# Patient Record
Sex: Male | Born: 1955 | Race: Black or African American | Hispanic: No | Marital: Married | Smoking: Former smoker
Health system: Southern US, Community
[De-identification: ages and names within clinical notes are randomized; demographics above are authoritative.]

## PROBLEM LIST (undated history)

## (undated) DIAGNOSIS — M25511 Pain in right shoulder: Principal | ICD-10-CM

## (undated) DIAGNOSIS — M67912 Unspecified disorder of synovium and tendon, left shoulder: Secondary | ICD-10-CM

## (undated) DIAGNOSIS — M25569 Pain in unspecified knee: Secondary | ICD-10-CM

## (undated) DIAGNOSIS — M67814 Other specified disorders of tendon, left shoulder: Principal | ICD-10-CM

## (undated) DIAGNOSIS — Z79899 Other long term (current) drug therapy: Secondary | ICD-10-CM

## (undated) DIAGNOSIS — M25552 Pain in left hip: Secondary | ICD-10-CM

## (undated) DIAGNOSIS — S83242A Other tear of medial meniscus, current injury, left knee, initial encounter: Secondary | ICD-10-CM

## (undated) DIAGNOSIS — S59919A Unspecified injury of unspecified forearm, initial encounter: Secondary | ICD-10-CM

## (undated) DIAGNOSIS — T4145XA Adverse effect of unspecified anesthetic, initial encounter: Secondary | ICD-10-CM

## (undated) DIAGNOSIS — G4700 Insomnia, unspecified: Secondary | ICD-10-CM

## (undated) DIAGNOSIS — S065X9A Traumatic subdural hemorrhage with loss of consciousness of unspecified duration, initial encounter: Secondary | ICD-10-CM

## (undated) DIAGNOSIS — Z5189 Encounter for other specified aftercare: Secondary | ICD-10-CM

## (undated) DIAGNOSIS — T7840XA Allergy, unspecified, initial encounter: Secondary | ICD-10-CM

## (undated) DIAGNOSIS — K219 Gastro-esophageal reflux disease without esophagitis: Secondary | ICD-10-CM

## (undated) DIAGNOSIS — G4730 Sleep apnea, unspecified: Secondary | ICD-10-CM

## (undated) DIAGNOSIS — F4310 Post-traumatic stress disorder, unspecified: Secondary | ICD-10-CM

## (undated) DIAGNOSIS — N529 Male erectile dysfunction, unspecified: Secondary | ICD-10-CM

## (undated) DIAGNOSIS — F32A Depression, unspecified: Secondary | ICD-10-CM

## (undated) DIAGNOSIS — H269 Unspecified cataract: Secondary | ICD-10-CM

## (undated) DIAGNOSIS — M545 Low back pain, unspecified: Secondary | ICD-10-CM

## (undated) DIAGNOSIS — K5792 Diverticulitis of intestine, part unspecified, without perforation or abscess without bleeding: Secondary | ICD-10-CM

## (undated) DIAGNOSIS — G709 Myoneural disorder, unspecified: Secondary | ICD-10-CM

## (undated) DIAGNOSIS — T8859XA Other complications of anesthesia, initial encounter: Secondary | ICD-10-CM

## (undated) DIAGNOSIS — I10 Essential (primary) hypertension: Secondary | ICD-10-CM

## (undated) DIAGNOSIS — Q741 Congenital malformation of knee: Secondary | ICD-10-CM

## (undated) DIAGNOSIS — M1990 Unspecified osteoarthritis, unspecified site: Secondary | ICD-10-CM

## (undated) DIAGNOSIS — F329 Major depressive disorder, single episode, unspecified: Secondary | ICD-10-CM

## (undated) DIAGNOSIS — R51 Headache: Secondary | ICD-10-CM

## (undated) DIAGNOSIS — Z8489 Family history of other specified conditions: Secondary | ICD-10-CM

## (undated) HISTORY — DX: Male erectile dysfunction, unspecified: N52.9

## (undated) HISTORY — DX: Major depressive disorder, single episode, unspecified: F32.9

## (undated) HISTORY — DX: Traumatic subdural hemorrhage with loss of consciousness of unspecified duration, initial encounter: S06.5X9A

## (undated) HISTORY — DX: Gastro-esophageal reflux disease without esophagitis: K21.9

## (undated) HISTORY — PX: OTHER SURGICAL HISTORY: SHX169

## (undated) HISTORY — DX: Myoneural disorder, unspecified: G70.9

## (undated) HISTORY — DX: Depression, unspecified: F32.A

## (undated) HISTORY — PX: EYE SURGERY: SHX253

## (undated) HISTORY — PX: UPPER GASTROINTESTINAL ENDOSCOPY: SHX188

## (undated) HISTORY — DX: Encounter for other specified aftercare: Z51.89

## (undated) HISTORY — DX: Low back pain: M54.5

## (undated) HISTORY — DX: Insomnia, unspecified: G47.00

## (undated) HISTORY — DX: Headache: R51

## (undated) HISTORY — DX: Allergy, unspecified, initial encounter: T78.40XA

## (undated) HISTORY — DX: Post-traumatic stress disorder, unspecified: F43.10

## (undated) HISTORY — DX: Unspecified osteoarthritis, unspecified site: M19.90

## (undated) HISTORY — PX: VASECTOMY: SHX75

## (undated) HISTORY — DX: Unspecified cataract: H26.9

## (undated) HISTORY — DX: Low back pain, unspecified: M54.50

## (undated) HISTORY — PX: ULNAR NERVE TRANSPOSITION: SHX2595

## (undated) HISTORY — DX: Essential (primary) hypertension: I10

---

## 1977-11-24 ENCOUNTER — Encounter: Payer: Self-pay | Admitting: Family Medicine

## 1983-07-29 HISTORY — PX: CATARACT EXTRACTION: SUR2

## 1995-07-29 DIAGNOSIS — S065XAA Traumatic subdural hemorrhage with loss of consciousness status unknown, initial encounter: Secondary | ICD-10-CM

## 1995-07-29 DIAGNOSIS — S065X9A Traumatic subdural hemorrhage with loss of consciousness of unspecified duration, initial encounter: Secondary | ICD-10-CM

## 1995-07-29 HISTORY — DX: Traumatic subdural hemorrhage with loss of consciousness status unknown, initial encounter: S06.5XAA

## 1995-07-29 HISTORY — DX: Traumatic subdural hemorrhage with loss of consciousness of unspecified duration, initial encounter: S06.5X9A

## 1995-07-29 HISTORY — PX: BURR HOLE FOR SUBDURAL HEMATOMA: SHX1275

## 1998-07-28 HISTORY — PX: LUMBAR LAMINECTOMY: SHX95

## 2003-07-29 HISTORY — PX: KNEE ARTHROSCOPY: SHX127

## 2007-07-01 ENCOUNTER — Ambulatory Visit: Payer: Self-pay | Admitting: Family Medicine

## 2007-07-01 DIAGNOSIS — G4700 Insomnia, unspecified: Secondary | ICD-10-CM | POA: Insufficient documentation

## 2007-07-01 DIAGNOSIS — F329 Major depressive disorder, single episode, unspecified: Secondary | ICD-10-CM

## 2007-07-01 DIAGNOSIS — R519 Headache, unspecified: Secondary | ICD-10-CM | POA: Insufficient documentation

## 2007-07-01 DIAGNOSIS — J309 Allergic rhinitis, unspecified: Secondary | ICD-10-CM | POA: Insufficient documentation

## 2007-07-01 DIAGNOSIS — I10 Essential (primary) hypertension: Secondary | ICD-10-CM | POA: Insufficient documentation

## 2007-07-01 DIAGNOSIS — R51 Headache: Secondary | ICD-10-CM

## 2007-07-01 DIAGNOSIS — K219 Gastro-esophageal reflux disease without esophagitis: Secondary | ICD-10-CM

## 2007-07-01 DIAGNOSIS — F528 Other sexual dysfunction not due to a substance or known physiological condition: Secondary | ICD-10-CM | POA: Insufficient documentation

## 2007-07-08 LAB — CONVERTED CEMR LAB
ALT: 31 units/L (ref 0–53)
AST: 25 units/L (ref 0–37)
Albumin: 4.1 g/dL (ref 3.5–5.2)
Alkaline Phosphatase: 52 units/L (ref 39–117)
BUN: 13 mg/dL (ref 6–23)
Basophils Relative: 0.1 % (ref 0.0–1.0)
Bilirubin, Direct: 0.2 mg/dL (ref 0.0–0.3)
Chloride: 107 meq/L (ref 96–112)
Creatinine, Ser: 0.9 mg/dL (ref 0.4–1.5)
Direct LDL: 123.6 mg/dL
Eosinophils Absolute: 0.2 10*3/uL (ref 0.0–0.6)
Eosinophils Relative: 3.3 % (ref 0.0–5.0)
GFR calc Af Amer: 114 mL/min
GFR calc non Af Amer: 94 mL/min
HDL: 44.8 mg/dL (ref >39.0)
Hemoglobin: 13.8 g/dL (ref 13.0–17.0)
Neutrophils Relative %: 42 % — ABNORMAL LOW (ref 43.0–77.0)
PSA: 0.37 ng/mL (ref 0.10–4.00)
Platelets: 198 10*3/uL (ref 150–400)
Potassium: 4.8 meq/L (ref 3.5–5.1)
RDW: 13.6 % (ref 11.5–14.6)
Sodium: 142 meq/L (ref 135–145)
Total CHOL/HDL Ratio: 4.5
Total Protein: 7 g/dL (ref 6.0–8.3)
Triglycerides: 96 mg/dL (ref 0–149)

## 2007-10-19 ENCOUNTER — Ambulatory Visit: Payer: Self-pay | Admitting: Family Medicine

## 2007-11-03 ENCOUNTER — Encounter: Payer: Self-pay | Admitting: Family Medicine

## 2007-11-04 ENCOUNTER — Ambulatory Visit: Payer: Self-pay | Admitting: Gastroenterology

## 2007-11-18 ENCOUNTER — Ambulatory Visit: Payer: Self-pay | Admitting: Gastroenterology

## 2007-11-18 ENCOUNTER — Encounter: Payer: Self-pay | Admitting: Family Medicine

## 2007-11-24 ENCOUNTER — Encounter: Payer: Self-pay | Admitting: Family Medicine

## 2007-11-30 ENCOUNTER — Ambulatory Visit: Payer: Self-pay | Admitting: Family Medicine

## 2007-12-03 ENCOUNTER — Telehealth: Payer: Self-pay | Admitting: Family Medicine

## 2007-12-06 LAB — CONVERTED CEMR LAB
HCV Ab: NEGATIVE
HIV-2 Ab: NEGATIVE
Hep B C IgM: NEGATIVE
Hep B Core Total Ab: NEGATIVE
Hep B E Ag: NEGATIVE

## 2008-01-14 ENCOUNTER — Telehealth: Payer: Self-pay | Admitting: Family Medicine

## 2008-03-01 ENCOUNTER — Telehealth: Payer: Self-pay | Admitting: Family Medicine

## 2008-03-03 ENCOUNTER — Ambulatory Visit: Payer: Self-pay | Admitting: Family Medicine

## 2008-03-03 DIAGNOSIS — N4400 Torsion of testis, unspecified: Secondary | ICD-10-CM | POA: Insufficient documentation

## 2008-03-20 ENCOUNTER — Encounter: Payer: Self-pay | Admitting: Family Medicine

## 2008-04-20 ENCOUNTER — Encounter: Payer: Self-pay | Admitting: Family Medicine

## 2008-06-19 ENCOUNTER — Observation Stay (HOSPITAL_COMMUNITY): Admission: EM | Admit: 2008-06-19 | Discharge: 2008-06-20 | Payer: Self-pay | Admitting: Emergency Medicine

## 2008-06-19 ENCOUNTER — Ambulatory Visit: Payer: Self-pay | Admitting: Internal Medicine

## 2008-06-28 ENCOUNTER — Ambulatory Visit: Payer: Self-pay | Admitting: Family Medicine

## 2008-06-28 DIAGNOSIS — K625 Hemorrhage of anus and rectum: Secondary | ICD-10-CM | POA: Insufficient documentation

## 2008-07-25 ENCOUNTER — Encounter: Payer: Self-pay | Admitting: Gastroenterology

## 2008-08-08 ENCOUNTER — Ambulatory Visit: Payer: Self-pay | Admitting: Gastroenterology

## 2008-08-08 ENCOUNTER — Encounter: Payer: Self-pay | Admitting: Family Medicine

## 2008-08-17 ENCOUNTER — Telehealth: Payer: Self-pay | Admitting: Family Medicine

## 2008-08-18 ENCOUNTER — Ambulatory Visit: Payer: Self-pay | Admitting: Gastroenterology

## 2008-08-18 HISTORY — PX: ESOPHAGOGASTRODUODENOSCOPY: SHX1529

## 2008-09-20 ENCOUNTER — Ambulatory Visit: Payer: Self-pay | Admitting: Family Medicine

## 2008-09-20 DIAGNOSIS — M25569 Pain in unspecified knee: Secondary | ICD-10-CM

## 2008-09-20 DIAGNOSIS — M545 Low back pain: Secondary | ICD-10-CM

## 2008-09-20 DIAGNOSIS — M1990 Unspecified osteoarthritis, unspecified site: Secondary | ICD-10-CM | POA: Insufficient documentation

## 2008-09-27 ENCOUNTER — Encounter: Payer: Self-pay | Admitting: Family Medicine

## 2008-10-31 ENCOUNTER — Telehealth: Payer: Self-pay | Admitting: Family Medicine

## 2008-11-06 ENCOUNTER — Encounter: Payer: Self-pay | Admitting: Family Medicine

## 2008-11-21 ENCOUNTER — Encounter: Payer: Self-pay | Admitting: Family Medicine

## 2008-11-22 ENCOUNTER — Ambulatory Visit: Payer: Self-pay | Admitting: Family Medicine

## 2008-11-22 DIAGNOSIS — M79609 Pain in unspecified limb: Secondary | ICD-10-CM

## 2008-12-07 ENCOUNTER — Ambulatory Visit: Payer: Self-pay | Admitting: Family Medicine

## 2008-12-07 ENCOUNTER — Telehealth: Payer: Self-pay | Admitting: Family Medicine

## 2008-12-07 DIAGNOSIS — M5380 Other specified dorsopathies, site unspecified: Secondary | ICD-10-CM | POA: Insufficient documentation

## 2008-12-07 DIAGNOSIS — T1490XA Injury, unspecified, initial encounter: Secondary | ICD-10-CM

## 2008-12-26 ENCOUNTER — Encounter (INDEPENDENT_AMBULATORY_CARE_PROVIDER_SITE_OTHER): Payer: Self-pay | Admitting: *Deleted

## 2008-12-26 ENCOUNTER — Encounter: Admission: RE | Admit: 2008-12-26 | Discharge: 2008-12-26 | Payer: Self-pay | Admitting: Allergy and Immunology

## 2009-04-30 ENCOUNTER — Telehealth: Payer: Self-pay | Admitting: Family Medicine

## 2009-05-18 ENCOUNTER — Ambulatory Visit: Payer: Self-pay | Admitting: Family Medicine

## 2009-05-18 DIAGNOSIS — M542 Cervicalgia: Secondary | ICD-10-CM

## 2009-06-18 ENCOUNTER — Ambulatory Visit: Payer: Self-pay | Admitting: Family Medicine

## 2009-08-06 ENCOUNTER — Encounter: Payer: Self-pay | Admitting: Family Medicine

## 2009-08-17 ENCOUNTER — Ambulatory Visit: Payer: Self-pay | Admitting: Family Medicine

## 2009-08-17 DIAGNOSIS — M779 Enthesopathy, unspecified: Secondary | ICD-10-CM | POA: Insufficient documentation

## 2009-11-13 ENCOUNTER — Ambulatory Visit: Payer: Self-pay | Admitting: Family Medicine

## 2009-11-13 DIAGNOSIS — R079 Chest pain, unspecified: Secondary | ICD-10-CM

## 2009-11-14 ENCOUNTER — Encounter: Payer: Self-pay | Admitting: Family Medicine

## 2009-11-15 ENCOUNTER — Telehealth: Payer: Self-pay | Admitting: Family Medicine

## 2009-11-21 ENCOUNTER — Telehealth (INDEPENDENT_AMBULATORY_CARE_PROVIDER_SITE_OTHER): Payer: Self-pay | Admitting: *Deleted

## 2009-11-22 ENCOUNTER — Encounter: Payer: Self-pay | Admitting: Internal Medicine

## 2009-11-22 ENCOUNTER — Ambulatory Visit: Payer: Self-pay

## 2009-11-22 ENCOUNTER — Encounter (HOSPITAL_COMMUNITY): Admission: RE | Admit: 2009-11-22 | Discharge: 2010-01-30 | Payer: Self-pay | Admitting: Family Medicine

## 2009-11-22 ENCOUNTER — Ambulatory Visit: Payer: Self-pay | Admitting: Cardiology

## 2009-12-17 ENCOUNTER — Telehealth: Payer: Self-pay | Admitting: Family Medicine

## 2009-12-18 ENCOUNTER — Telehealth: Payer: Self-pay | Admitting: Family Medicine

## 2010-01-01 ENCOUNTER — Encounter: Admission: RE | Admit: 2010-01-01 | Discharge: 2010-01-01 | Payer: Self-pay | Admitting: Orthopedic Surgery

## 2010-01-01 ENCOUNTER — Encounter: Payer: Self-pay | Admitting: Family Medicine

## 2010-01-10 ENCOUNTER — Encounter: Payer: Self-pay | Admitting: Family Medicine

## 2010-01-29 ENCOUNTER — Encounter: Payer: Self-pay | Admitting: Family Medicine

## 2010-02-06 ENCOUNTER — Encounter: Payer: Self-pay | Admitting: Family Medicine

## 2010-03-26 ENCOUNTER — Ambulatory Visit: Payer: Self-pay | Admitting: Family Medicine

## 2010-03-26 DIAGNOSIS — F4310 Post-traumatic stress disorder, unspecified: Secondary | ICD-10-CM

## 2010-04-29 ENCOUNTER — Encounter: Payer: Self-pay | Admitting: Family Medicine

## 2010-07-15 ENCOUNTER — Telehealth: Payer: Self-pay | Admitting: Family Medicine

## 2010-08-27 NOTE — Letter (Signed)
Summary: [facility]   [facility]   Imported By: Maryln Gottron 05/21/2010 14:04:42  _____________________________________________________________________  External Attachment:    Type:   Image     Comment:   External Document

## 2010-08-27 NOTE — Letter (Signed)
Summary: [facility]   Imported By: Maryln Gottron 03/29/2010 10:34:31  _____________________________________________________________________  External Attachment:    Type:   Image     Comment:   External Document

## 2010-08-27 NOTE — Op Note (Signed)
Summary: Right Knee Meniscal Repair/Dr. Marrianne Mood Dean  Right Knee Meniscal Repair/Dr. Burnard Bunting   Imported By: Maryln Gottron 03/29/2010 10:31:16  _____________________________________________________________________  External Attachment:    Type:   Image     Comment:   External Document

## 2010-08-27 NOTE — Progress Notes (Signed)
Summary: Pt req refill of Viagra via [facility] mail order pharmacy  Phone Note Call from Patient Call back at Work Phone [phone]   Caller: Patient Summary of Call: Pt called and is req refill of Viagra to [facility] mail order.  Please call it in to [phone] Ref# [id number].    [facility] can not rcv electronic at this time. Refill must be called or faxed in.  Initial call taken by: Lucy Antigua,  November 15, 2009 10:42 AM  Follow-up for Phone Call        call in Viagra 100 mg as needed , #30 with 3 rf Follow-up by: Nelwyn Salisbury MD,  November 19, 2009 1:24 PM  Additional Follow-up for Phone Call Additional follow up Details #1::        Rx Called In Additional Follow-up by: Raechel Ache, RN,  November 19, 2009 1:52 PM    Prescriptions: VIAGRA 100 MG TABS (SILDENAFIL CITRATE) as needed  #30 x 3   Entered by:   Raechel Ache, RN   Authorized by:   Nelwyn Salisbury MD   Signed by:   Raechel Ache, RN on 11/19/2009   Method used:   Faxed to ...       [facility]* (mail-order)             ,          Ph: [phone]       Fax: [phone]   RxID:   [id number]
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
## 2010-08-27 NOTE — Letter (Signed)
Summary: [facility]   Imported By: Maryln Gottron 03/29/2010 10:35:49  _____________________________________________________________________  External Attachment:    Type:   Image     Comment:   External Document

## 2010-08-27 NOTE — Assessment & Plan Note (Signed)
Summary: Cardiology Nuclear Study  Nuclear Med Background Indications for Stress Test: Evaluation for Ischemia   History: GXT  History Comments:  ~10 yrs. ago GXT:OK per patient  Symptoms: Chest Tightness, Dizziness, DOE, Fatigue, Palpitations  Symptoms Comments: Last episode of CP:now, 2/10.   Nuclear Pre-Procedure Cardiac Risk Factors: Family History - CAD, History of Smoking, Hypertension, Lipids Caffeine/Decaff Intake: None NPO After: 11:30 PM Lungs: Clear IV 0.9% NS with Angio Cath: 20g     IV Site: (R) AC IV Started by: Irean Hong RN Chest Size (in) 42     Height (in): 74 Weight (lb): 225 BMI: 28.99  Nuclear Med Study 1 or 2 day study:  1 day     Stress Test Type:  Stress Reading MD:  Willa Rough, MD     Referring MD:  Gershon Crane, MD Resting Radionuclide:  Technetium 28m Tetrofosmin     Resting Radionuclide Dose:  11 mCi  Stress Radionuclide:  Technetium 31m Tetrofosmin     Stress Radionuclide Dose:  33 mCi   Stress Protocol Exercise Time (min):  10:30 min     Max HR:  142 bpm     Predicted Max HR:  166 bpm  Max Systolic BP: 216 mm Hg     Percent Max HR:  85.54 %     METS: 12.5 Rate Pressure Product:  16109    Stress Test Technologist:  Rea College CMA-N     Nuclear Technologist:  Domenic Polite CNMT  Rest Procedure  Myocardial perfusion imaging was performed at rest 45 minutes following the intravenous administration of Myoview Technetium 3m Tetrofosmin.  Stress Procedure  The patient exercised for 10:30.  The patient stopped due to fatigue.  He denied  any chest pain with exercise, but did c/o a brief episode of chest tightness, 4/10, immediately post exercise.  There were no documented ST-T wave changes.  He did have a hypertensive response to exercise at peak, 213/107 and 216/105.  Myoview was injected at peak exercise and myocardial perfusion imaging was performed after a brief delay.  QPS Raw Data Images:  Normal; no motion artifact; normal  heart/lung ratio. Stress Images:  There is normal uptake in all areas. Rest Images:  Normal homogeneous uptake in all areas of the myocardium. Subtraction (SDS):  No evidence of ischemia. Transient Ischemic Dilatation:  1.02  (Normal <1.22)  Lung/Heart Ratio:  .31  (Normal <0.45)  Quantitative Gated Spect Images QGS EDV:  101 ml QGS ESV:  42 ml QGS EF:  58 % QGS cine images:  Normal motion  Findings Normal nuclear study      Overall Impression  Exercise Capacity: Good exercise capacity. BP Response: Hypertensive blood pressure response. Clinical Symptoms: Mild chest pain in recovery ECG Impression: No significant ST segment change suggestive of ischemia. Overall Impression: Normal stress nuclear study. Hypertensive response.
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
## 2010-08-27 NOTE — Progress Notes (Signed)
Summary: Nuclear Pre-Procedure  Phone Note Outgoing Call   Call placed by: Milana Na, EMT-P,  November 21, 2009 9:21 AM Summary of Call: Reviewed information on Myoview Information Sheet (see scanned document for further details).  Spoke with patient's wife.     Nuclear Med Background Indications for Stress Test: Evaluation for Ischemia     Symptoms: Chest Tightness    Nuclear Pre-Procedure Cardiac Risk Factors: Family History - CAD, Hypertension Height (in): 71.75  Nuclear Med Study Referring MD:  S.Clent Ridges

## 2010-08-27 NOTE — Progress Notes (Signed)
Summary: refill Ambien  Phone Note Call from Patient   Caller: Patient Call For: Nelwyn Salisbury MD Reason for Call: Refill Medication Summary of Call: refill Ambien to [facility] Initial call taken by: Raechel Ache, RN,  Dec 18, 2009 9:52 AM  Follow-up for Phone Call        done  Follow-up by: Nelwyn Salisbury MD,  Dec 18, 2009 10:21 AM    New/Updated Medications: AMBIEN 10 MG  TABS (ZOLPIDEM TARTRATE) 2 tabs at bedtime Prescriptions: AMBIEN 10 MG  TABS (ZOLPIDEM TARTRATE) 2 tabs at bedtime  #180 x 1   Entered and Authorized by:   Nelwyn Salisbury MD   Signed by:   Nelwyn Salisbury MD on 12/18/2009   Method used:   Print then Give to Patient   RxID:   [id number]  faxed to [facility].

## 2010-08-27 NOTE — Letter (Signed)
Summary: [facility]  [facility]   Imported By: Maryln Gottron 09/04/2009 13:37:48  _____________________________________________________________________  External Attachment:    Type:   Image     Comment:   External Document

## 2010-08-27 NOTE — Assessment & Plan Note (Signed)
Summary: ? trigger finger?/dm   Vital Signs:  Patient profile:   55 year old male Weight:      236 pounds Temp:     97.7 degrees F oral Pulse rate:   75 / minute BP sitting:   124 / 90  (left arm) Cuff size:   large  Vitals Entered By: Alfred Levins, CMA (August 17, 2009 11:05 AM) CC: rt index finger pain   History of Present Illness: Here asking about pain in the right index finger over the past 2 weeks. For months he has been taking classes by computer and also working by computer, so for many hours every day he has been using a mouse on his computer. No other trauma. using Tylenol.  Current Medications (verified): 1)  Patanase 0.6 %  Soln (Olopatadine Hcl) .... 2 Sprays Each Nostril Once Daily 2)  Allegra 180 Mg  Tabs (Fexofenadine Hcl) .... Once Daily As Needed 3)  Singulair 10 Mg  Tabs (Montelukast Sodium) .... Once Daily As Needed 4)  Ambien 10 Mg  Tabs (Zolpidem Tartrate) .... 2 Tabs At Bedtime 5)  Zoloft 50 Mg Tabs (Sertraline Hcl) .... Once Daily 6)  Hydrochlorothiazide 25 Mg Tabs (Hydrochlorothiazide) .... One By Mouth Daily 7)  Anusol-Hc 25 Mg  Supp (Hydrocortisone Acetate) .... Use As Directed 8)  Viagra 100 Mg Tabs (Sildenafil Citrate) .... As Needed 9)  Vicodin 5-500 Mg Tabs (Hydrocodone-Acetaminophen) .... As Needed 10)  Robaxin-750 750 Mg Tabs (Methocarbamol) .Marland Kitchen.. 1 Q 6 Hours As Needed Spasm  Allergies (verified): 1)  Compazine 2)  * Omnicef  Past History:  Past Medical History: Reviewed history from 09/20/2008 and no changes required. Depression GERD, per Dr. Russella Dar Headache Hypertension insomnia Allergic rhinitis subdural hematoma 1997 ED hemorrhoids Low back pain Osteoarthritis  Past Surgical History: Reviewed history from 09/20/2008 and no changes required. burr holes in skull to relieve subdural hematoma 1997 Lumbar laminectomy 2000 Vasectomy colonoscopy 11-18-07 per Dr. Russella Dar, showed internal hemorrhoids and diverticulae, to repeat in 5  yrs EGD 08-18-08 per Dr. Russella Dar, showed erosive esophagitis right knee arthroscopy 2005   Review of Systems  The patient denies anorexia, fever, weight loss, weight gain, vision loss, decreased hearing, hoarseness, chest pain, syncope, dyspnea on exertion, peripheral edema, prolonged cough, headaches, hemoptysis, abdominal pain, melena, hematochezia, severe indigestion/heartburn, hematuria, incontinence, genital sores, muscle weakness, suspicious skin lesions, transient blindness, difficulty walking, depression, unusual weight change, abnormal bleeding, enlarged lymph nodes, angioedema, breast masses, and testicular masses.    Physical Exam  General:  Well-developed,well-nourished,in no acute distress; alert,appropriate and cooperative throughout examination Extremities:  tender along the flexor surface of the right index finger. No joint tenderness or swelling. Full ROM   Impression & Recommendations:  Problem # 1:  TENDINITIS (ICD-726.90)  Complete Medication List: 1)  Patanase 0.6 % Soln (Olopatadine hcl) .... 2 sprays each nostril once daily 2)  Allegra 180 Mg Tabs (Fexofenadine hcl) .... Once daily as needed 3)  Singulair 10 Mg Tabs (Montelukast sodium) .... Once daily as needed 4)  Ambien 10 Mg Tabs (Zolpidem tartrate) .... 2 tabs at bedtime 5)  Zoloft 50 Mg Tabs (Sertraline hcl) .... Once daily 6)  Hydrochlorothiazide 25 Mg Tabs (Hydrochlorothiazide) .... One by mouth daily 7)  Anusol-hc 25 Mg Supp (Hydrocortisone acetate) .... Use as directed 8)  Viagra 100 Mg Tabs (Sildenafil citrate) .... As needed 9)  Vicodin 5-500 Mg Tabs (Hydrocodone-acetaminophen) .... As needed 10)  Robaxin-750 750 Mg Tabs (Methocarbamol) .Marland Kitchen.. 1 q 6 hours as needed  spasm 11)  Naproxen 500 Mg Tabs (Naproxen) .... Two times a day as needed pain  Patient Instructions: 1)  This is flexor tendonitis of the finger from overuse. Advised him to try to use the left hand or other fingers on the right hand to rest  the index finger. Use ice baths. Try Naproxyn. 2)  Please schedule a follow-up appointment as needed .  Prescriptions: NAPROXEN 500 MG TABS (NAPROXEN) two times a day as needed pain  #60 x 11   Entered and Authorized by:   Nelwyn Salisbury MD   Signed by:   Nelwyn Salisbury MD on 08/17/2009   Method used:   Print then Give to Patient   RxID:   [id number]

## 2010-08-27 NOTE — Letter (Signed)
Summary: [facility]  [facility] [place]   Imported By: Maryln Gottron 03/29/2010 10:38:04  _____________________________________________________________________  External Attachment:    Type:   Image     Comment:   External Document

## 2010-08-27 NOTE — Medication Information (Signed)
Summary: Notification Number for Imaging Procedure  Notification Number for Imaging Procedure   Imported By: Maryln Gottron 11/22/2009 11:21:46  _____________________________________________________________________  External Attachment:    Type:   Image     Comment:   External Document

## 2010-08-27 NOTE — Progress Notes (Signed)
Summary: phone  Phone Note Call from Patient Call back at Work Phone [phone]   Caller: Patient Call For: Nelwyn Salisbury MD Summary of Call: Never got results from stress test. He dropped off a few letters on Friday and is calling regarding that. Wants referral for knee pain. Initial call taken by: Raechel Ache, RN,  Dec 17, 2009 1:03 PM  Follow-up for Phone Call        His stress test was normal. i have dictated a letter about his service related problems, and it will be ready in a few days. Also, refer him to Dr. Homero Fellers Aluisio for right knee pain.  Follow-up by: Nelwyn Salisbury MD,  Dec 18, 2009 8:41 AM  Additional Follow-up for Phone Call Additional follow up Details #1::        Phone Call Completed Additional Follow-up by: Raechel Ache, RN,  Dec 18, 2009 9:20 AM

## 2010-08-29 NOTE — Progress Notes (Signed)
Summary: Pt req refill of generic Ambien to [facility]  Phone Note Refill Request Call back at Work Phone [phone]   Refills Requested: Medication #1:  AMBIEN 10 MG  TABS 2 tabs at bedtime   Dosage confirmed as above?Dosage Confirmed   Supply Requested: 3 months Pls call this to [facility] mail [facility] pharm. Pls be sure to put mem id# [id number] on the request. The fax # is [phone]   Method Requested: Telephone to [facility] Initial call taken by: Lucy Antigua,  July 15, 2010 3:34 PM  Follow-up for Phone Call        call in #180 with one rf Follow-up by: Nelwyn Salisbury MD,  July 16, 2010 10:22 AM  Additional Follow-up for Phone Call Additional follow up Details #1::        called to [facility] spoke to [place] Additional Follow-up by: Pura Spice, RN,  July 16, 2010 10:49 AM

## 2010-09-27 ENCOUNTER — Encounter: Payer: Self-pay | Admitting: Family Medicine

## 2010-09-27 ENCOUNTER — Ambulatory Visit (INDEPENDENT_AMBULATORY_CARE_PROVIDER_SITE_OTHER): Payer: BC Managed Care – PPO | Admitting: Family Medicine

## 2010-09-27 VITALS — BP 120/82 | HR 79 | Temp 98.4°F | Wt 229.0 lb

## 2010-09-27 DIAGNOSIS — R5383 Other fatigue: Secondary | ICD-10-CM

## 2010-09-27 DIAGNOSIS — M109 Gout, unspecified: Secondary | ICD-10-CM

## 2010-09-27 DIAGNOSIS — E785 Hyperlipidemia, unspecified: Secondary | ICD-10-CM

## 2010-09-27 DIAGNOSIS — R5381 Other malaise: Secondary | ICD-10-CM

## 2010-09-27 DIAGNOSIS — R531 Weakness: Secondary | ICD-10-CM

## 2010-09-27 LAB — LIPID PANEL
HDL: 49.3 mg/dL (ref >39.00)
Total CHOL/HDL Ratio: 4
VLDL: 23 mg/dL (ref 0.0–40.0)

## 2010-09-27 LAB — BASIC METABOLIC PANEL
Chloride: 101 mEq/L (ref 96–112)
Creatinine, Ser: 0.9 mg/dL (ref 0.4–1.5)
GFR: 108.22 mL/min (ref >60.00)
Glucose, Bld: 91 mg/dL (ref 70–99)

## 2010-09-27 LAB — VITAMIN B12: Vitamin B-12: 346 pg/mL (ref 211–911)

## 2010-09-27 LAB — URIC ACID: Uric Acid, Serum: 6.3 mg/dL (ref 4.0–7.8)

## 2010-09-27 LAB — CBC WITH DIFFERENTIAL/PLATELET
Basophils Absolute: 0 10*3/uL (ref 0.0–0.1)
Basophils Relative: 0.7 % (ref 0.0–3.0)
Hemoglobin: 14.1 g/dL (ref 13.0–17.0)
Monocytes Absolute: 0.4 10*3/uL (ref 0.1–1.0)
Neutro Abs: 2.3 10*3/uL (ref 1.4–7.7)
Platelets: 186 10*3/uL (ref 150.0–400.0)

## 2010-09-27 LAB — LDL CHOLESTEROL, DIRECT: Direct LDL: 140.2 mg/dL

## 2010-09-27 NOTE — Progress Notes (Signed)
  Subjective:    Patient ID: Cesar Knight, male    DOB: 02-14-1956, 55 y.o.   MRN: [id number]  HPI Here for several reasons. First he has had pain in both great toes for 2 months. He has taken Meloxicam for joint pains in the past but has not used it lately. The toes do not get red or hot or swollen. Also he has been very fatigued for several months, and he is not sure why.    Review of Systems  Constitutional: Positive for fatigue. Negative for activity change, appetite change and unexpected weight change.  Respiratory: Negative.   Cardiovascular: Negative.   Gastrointestinal: Negative.   Musculoskeletal: Positive for arthralgias. Negative for joint swelling.       Objective:   Physical Exam  Constitutional: He appears well-developed and well-nourished.  Neck: No thyromegaly present.  Cardiovascular: Normal rate, regular rhythm, normal heart sounds and intact distal pulses.   Pulmonary/Chest: Effort normal and breath sounds normal.  Musculoskeletal: Normal range of motion. He exhibits no edema and no tenderness.  Lymphadenopathy:    He has no cervical adenopathy.          Assessment & Plan:  Get back on Meloxicam since this should help with the toe pains. Check for gout. Get labs for fatigue

## 2010-09-30 ENCOUNTER — Telehealth: Payer: Self-pay | Admitting: Family Medicine

## 2010-09-30 NOTE — Telephone Encounter (Signed)
Pt called to obtain results of recent labwork.... Pt can be reached at [phone].

## 2010-10-06 ENCOUNTER — Emergency Department (HOSPITAL_COMMUNITY)
Admission: EM | Admit: 2010-10-06 | Discharge: 2010-10-07 | Disposition: A | Discharge status: Home / Self Care | Payer: BC Managed Care – PPO | Attending: Emergency Medicine | Admitting: Emergency Medicine

## 2010-10-06 DIAGNOSIS — K3189 Other diseases of stomach and duodenum: Secondary | ICD-10-CM | POA: Insufficient documentation

## 2010-10-06 DIAGNOSIS — I10 Essential (primary) hypertension: Secondary | ICD-10-CM | POA: Insufficient documentation

## 2010-11-09 ENCOUNTER — Encounter: Payer: Self-pay | Admitting: Family Medicine

## 2010-11-09 ENCOUNTER — Ambulatory Visit (INDEPENDENT_AMBULATORY_CARE_PROVIDER_SITE_OTHER): Payer: BC Managed Care – PPO | Admitting: Family Medicine

## 2010-11-09 DIAGNOSIS — L989 Disorder of the skin and subcutaneous tissue, unspecified: Secondary | ICD-10-CM
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
## 2010-12-10 NOTE — Letter (Signed)
Dec 18, 2009    To Whom It May Concern:   RE:  [name], [name]  MRN:  [id number]  /  DOB:  [birth year]   This letter is concerning the patient of mine by the name of, Cesar Knight, (Date of Birth, May 20, 1956).   I have served as this patient's primary care Cesar Knight, since December of  2008.  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  He has chronic problems causing severe pain  in both of his knees, and developed this during his [employer].  Because of his chronic knee pain, he has an abnormal walking gait and  this has put stress on his feet and his low back.  This has caused low  back pain and bilateral foot pain which is chronic, again as a result of  his [employer].  He also has developed problems with posttraumatic  stress disorder (PTSD), again directly related to his years of [employer]  service.  We have been treating him with a combination of antidepressant  medications, sleep medications, and psychotherapy to deal with this  continuing problem.  In addition, years of taking nonsteroidal  antiinflammatory medications, like ibuprofen have caused bleeding,  stomach ulcers, and other gastrointestinal problems.  Again these  problems relate to his years of military service, which stem back to the  foot, back, and knee problems, mentioned above.   All of the problems mentioned above are directly related to his years of  military service, and they continue to be ongoing medical problems for  him.  I continued to treat him for these problems, and I expect him to  be long-term problems in his future.   If I may be of further assistance, please let me know.    Sincerely,      Tera Mater. Clent Ridges, MD  Electronically Signed    SAF/MedQ  DD: 12/18/2009  DT: 12/18/2009  Job #: [id number]

## 2010-12-10 NOTE — Discharge Summary (Signed)
NAMEJAMORIAN, DIMARIA                 ACCOUNT NO.:  [id number]   MEDICAL RECORD NO.:  [id number]          PATIENT TYPE:  OBV   LOCATION:  4729                         FACILITY:  [facility]   PHYSICIAN:  Valerie A. Felicity Coyer, MDDATE OF BIRTH:  11-Jul-1956   DATE OF ADMISSION:  06/19/2008  DATE OF DISCHARGE:  06/20/2008                               DISCHARGE SUMMARY   DISCHARGE DIAGNOSES:  1. Bright red blood per rectum, likely secondary to the hemorrhoidal      bleed.  Hemodynamically stable and resolved at this time.  2. Hypertension.  3. Probable benign prostatic hypertrophy.  Consider addition of      Flomax.  We will defer to the patient's primary MD.  4. Gastroesophageal reflux disease.  5. Degenerative joint disease.  6. Acute on chronic low back pain.  7. Chronic allergic rhinitis.   HISTORY OF PRESENT ILLNESS:  Mr. Tyshawn is a 55 year old African American  male who was admitted on June 19, 2008 with chief complaint of  bloody stools and weakness that started on Saturday, June 17, 2008,  in the morning.  On Sunday, June 18, 2008, he noted approximately 4-  5 loose stools with bright red blood and clotting.  He reports having  had a colonoscopy approximately 2 months ago, which was reportedly  negative except for high hemorrhoids.  He denied constipation prior to  the bleeding events.  He notes mild abdominal tenderness on both sides  of his lower abdomen on admission and acute on chronic low back pain.  He is admitted for further evaluation and treatment.   PAST MEDICAL HISTORY:  1. Chronic insomnia.  2. Erectile dysfunction.  3. Allergic rhinitis.  4. Hypertension.  5. Headache.  6. GERD.  7. Depression.  8. Hemorrhoids.  9. Chondromalacia, bilateral knees.   COURSE OF HOSPITALIZATION:  Bright red blood per rectum likely secondary  to hemorrhoidal bleed.  The patient was admitted.  He was given IV  fluids.  Admission hemoglobin was 12.8.  Hemoglobin this morning  is  again 12.8.  He has had no further clinical signs of bleeding.  He did  note that he had an 8-pound weight gain on 2 separate scales, one day  apart.  He did not show any signs of volume overload on exam.  BNP was  less than 30.  I suspect this is an error of scale.  The CT of the  abdomen and pelvis was performed on admission, which showed a tiny  nonspecific low-attenuation focus in the spleen without acute  abnormalities.  Hypertension.  He was noted to have some elevated blood pressure during  this admission ranging mainly in the 140s-150s range.  He states he was  on hydrochlorothiazide in the past, was well controlled and tolerated  this medicine.  We will resume hydrochlorothiazide at the time of  discharge.  He will need a followup blood pressure at his appointment  next week with Dr. Clent Ridges as well as a followup H and H.   PERTINENT LABORATORIES:  At the time of discharge, hemoglobin 12.8,  hematocrit 38.3.  BUN  14, creatinine 1.07.   MEDICATIONS AT THE TIME DISCHARGE:  1. Patanase 0.6% 2 sprays each nostril daily.  2. Cialis 20 mg as needed.  3. Allegra 180 mg p.o. daily.  4. Singulair 10 mg p.o. daily.  5. Nexium 40 mg p.o. daily.  6. Ambien 10 mg 2 tabs p.o. daily at bedtime.  7. Hydrochlorothiazide 25 mg p.o. daily.  Prescription provided for 30      tablets and 1 refill.   DISPOSITION:  He will be discharged to the home.   FOLLOWUP:  He is instructed to follow up with Dr. Gershon Crane on  June 28, 2008, at [facility] clock p.m. and to return to the ER should he  develop recurrent rectal bleeding or weakness.      Sandford Craze, NP      Raenette Rover. Felicity Coyer, MD  Electronically Signed    MO/MEDQ  D:  06/20/2008  T:  06/21/2008  Job:  [id number]   cc:   Gershon Crane, MD

## 2010-12-26 ENCOUNTER — Telehealth: Payer: Self-pay | Admitting: *Deleted

## 2010-12-26 NOTE — Telephone Encounter (Signed)
Refill request for: Zolpidem Tartrate 10mg  tablet [last refilled 12/18/09 #180x1] Please advise.

## 2010-12-26 NOTE — Telephone Encounter (Signed)
Request for 2nd Rx refill from [facility]: Viagra 100mg  tablet [11/19/09 #30x3] 1st request is Zolpidem in previous notation

## 2010-12-27 ENCOUNTER — Telehealth: Payer: Self-pay

## 2010-12-27 MED ORDER — SILDENAFIL CITRATE 100 MG PO TABS: 100.0000 mg | ORAL_TABLET | ORAL | Status: DC | PRN

## 2010-12-27 MED ORDER — ZOLPIDEM TARTRATE 10 MG PO TABS: 20.0000 mg | ORAL_TABLET | Freq: Every evening | ORAL | Status: DC | PRN

## 2010-12-27 NOTE — Telephone Encounter (Signed)
rx request from [facility] for zolpidem tartrate 10mg  and viagra 100mg  (requesting a 90 day supply for viagra)-----pls advise

## 2010-12-27 NOTE — Telephone Encounter (Signed)
Already done

## 2010-12-27 NOTE — Telephone Encounter (Signed)
done

## 2011-01-02 ENCOUNTER — Ambulatory Visit (INDEPENDENT_AMBULATORY_CARE_PROVIDER_SITE_OTHER): Payer: BC Managed Care – PPO | Admitting: Family Medicine

## 2011-01-02 ENCOUNTER — Encounter: Payer: Self-pay | Admitting: Family Medicine

## 2011-01-02 ENCOUNTER — Telehealth: Payer: Self-pay | Admitting: Family Medicine

## 2011-01-02 VITALS — BP 140/90 | Temp 98.9°F | Wt 220.0 lb

## 2011-01-02 DIAGNOSIS — B9789 Other viral agents as the cause of diseases classified elsewhere: Secondary | ICD-10-CM

## 2011-01-02 DIAGNOSIS — B349 Viral infection, unspecified: Secondary | ICD-10-CM

## 2011-01-02 LAB — CBC WITH DIFFERENTIAL/PLATELET
Basophils Relative: 0.5 % (ref 0.0–3.0)
Eosinophils Absolute: 0.1 10*3/uL (ref 0.0–0.7)
Eosinophils Relative: 2 % (ref 0.0–5.0)
HCT: 44 % (ref 39.0–52.0)
Lymphocytes Relative: 43.7 % (ref 12.0–46.0)
MCV: 93.4 fl (ref 78.0–100.0)
Monocytes Relative: 7 % (ref 3.0–12.0)
Neutro Abs: 2.7 10*3/uL (ref 1.4–7.7)
Neutrophils Relative %: 46.8 % (ref 43.0–77.0)
Platelets: 208 10*3/uL (ref 150.0–400.0)
RBC: 4.71 Mil/uL (ref 4.22–5.81)
WBC: 5.8 10*3/uL (ref 4.5–10.5)

## 2011-01-02 NOTE — Progress Notes (Signed)
  Subjective:    Patient ID: Cesar Knight, male    DOB: 08-Mar-1956, 55 y.o.   MRN: [id number]  HPI Patient same of onset of symptoms last Saturday. Initially developed some nonbloody diarrhea which only lasted for one day. Since then has had some diffuse body aches, malaise, chills but no documented fever, sinus congestion with postnasal drainage, left earache, nausea but no vomiting. Decreased appetite this week. Reported 8 pound weight loss. Intermittent headaches. No reported tick bites. Denies any abdominal pain, dysuria, or cough. He takes Allegra which has not seemed to help his postnasal drip symptoms much. Someone else was staying with the family has somewhat similar symptoms   Review of Systems  Constitutional: Positive for chills, appetite change and fatigue. Negative for fever.  HENT: Positive for postnasal drip and sinus pressure. Negative for sore throat.   Respiratory: Negative for cough, shortness of breath and wheezing.   Cardiovascular: Negative for chest pain and palpitations.  Gastrointestinal: Negative for abdominal pain.  Genitourinary: Negative for dysuria.  Skin: Negative for rash.  Neurological: Positive for weakness and headaches.  Hematological: Negative for adenopathy. Does not bruise/bleed easily.       Objective:   Physical Exam  Constitutional: He appears well-developed and well-nourished. No distress.  HENT:  Right Ear: External ear normal.  Left Ear: External ear normal.  Mouth/Throat: Oropharynx is clear and moist. No oropharyngeal exudate.  Neck: Neck supple.  Cardiovascular: Normal rate and regular rhythm.   No murmur heard. Pulmonary/Chest: Effort normal and breath sounds normal. No respiratory distress. He has no wheezes. He has no rales.  Musculoskeletal: He exhibits no edema.  Lymphadenopathy:    He has no cervical adenopathy.          Assessment & Plan:  Patient presents with multiple symptoms as above. Suspect problem viral syndrome.  Given duration of symptoms and progressive worsening check CBC. Try first generation antihistamine such as brompheniramine or chlorpheniramine for postnasal drip symptoms Advil for fever and body aches

## 2011-01-02 NOTE — Telephone Encounter (Signed)
Pt saw dr Caryl Never today requesting bloodwork results

## 2011-01-02 NOTE — Patient Instructions (Signed)
Consider over-the-counter antihistamine such as brompheniramine or chlorpheniramine for postnasal drip symptoms Continue Advil for symptomatic relief of body aches

## 2011-01-02 NOTE — Telephone Encounter (Signed)
Pt req 90 day supply with 3 refills of both the Zolpidem and Viagra to be sent to [facility].

## 2011-01-03 MED ORDER — SILDENAFIL CITRATE 100 MG PO TABS: 100.0000 mg | ORAL_TABLET | ORAL | Status: DC | PRN

## 2011-01-03 MED ORDER — ZOLPIDEM TARTRATE 10 MG PO TABS: 20.0000 mg | ORAL_TABLET | Freq: Every evening | ORAL | Status: DC | PRN

## 2011-01-03 NOTE — Progress Notes (Signed)
Quick Note:  Left message on pt's voice mail. ______ 

## 2011-01-03 NOTE — Telephone Encounter (Signed)
Rx faxed

## 2011-01-03 NOTE — Telephone Encounter (Signed)
Done

## 2011-04-30 LAB — COMPREHENSIVE METABOLIC PANEL
ALT: 25
AST: 29
Albumin: 3.6
Alkaline Phosphatase: 64
CO2: 27
Chloride: 102
Creatinine, Ser: 1.07
GFR calc Af Amer: >60
GFR calc non Af Amer: >60
Potassium: 4.1
Sodium: 136
Total Bilirubin: 0.9

## 2011-04-30 LAB — POCT I-STAT, CHEM 8
BUN: 16
Calcium, Ion: 1.09 — ABNORMAL LOW
Chloride: 104
Creatinine, Ser: 1.3
Glucose, Bld: 109 — ABNORMAL HIGH
TCO2: 27

## 2011-04-30 LAB — CBC
MCHC: 34.1
Platelets: 193
RBC: 4.1 — ABNORMAL LOW

## 2011-04-30 LAB — DIFFERENTIAL
Eosinophils Relative: 3
Lymphs Abs: 2.3
Neutro Abs: 2.6
Neutrophils Relative %: 47

## 2011-04-30 LAB — B-NATRIURETIC PEPTIDE (CONVERTED LAB): Pro B Natriuretic peptide (BNP): <30

## 2011-04-30 LAB — AMMONIA: Ammonia: 27

## 2011-04-30 LAB — ETHANOL: Alcohol, Ethyl (B): <5

## 2011-04-30 LAB — HEMOGLOBIN AND HEMATOCRIT, BLOOD
HCT: 38.3 — ABNORMAL LOW
Hemoglobin: 12.8 — ABNORMAL LOW

## 2011-05-09 ENCOUNTER — Ambulatory Visit: Payer: Self-pay | Admitting: Family Medicine

## 2011-06-09 ENCOUNTER — Encounter: Payer: Self-pay | Admitting: Family Medicine

## 2011-06-09 ENCOUNTER — Ambulatory Visit (INDEPENDENT_AMBULATORY_CARE_PROVIDER_SITE_OTHER): Payer: BC Managed Care – PPO | Admitting: Family Medicine

## 2011-06-09 VITALS — BP 138/86 | HR 89 | Temp 98.4°F | Wt 226.0 lb

## 2011-06-09 DIAGNOSIS — Z23 Encounter for immunization: Secondary | ICD-10-CM

## 2011-06-09 DIAGNOSIS — G4700 Insomnia, unspecified: Secondary | ICD-10-CM

## 2011-06-09 DIAGNOSIS — N400 Enlarged prostate without lower urinary tract symptoms: Secondary | ICD-10-CM

## 2011-06-09 DIAGNOSIS — M25559 Pain in unspecified hip: Secondary | ICD-10-CM

## 2011-06-09 MED ORDER — ZOLPIDEM TARTRATE ER 12.5 MG PO TBCR: 12.5000 mg | EXTENDED_RELEASE_TABLET | Freq: Every evening | ORAL | Status: DC | PRN

## 2011-06-09 MED ORDER — MELOXICAM 15 MG PO TABS: 15.0000 mg | ORAL_TABLET | Freq: Every day | ORAL | Status: AC

## 2011-06-09 NOTE — Progress Notes (Signed)
Addended by: Aniceto Boss A on: 06/09/2011 12:26 PM   Modules accepted: Orders

## 2011-06-09 NOTE — Progress Notes (Signed)
  Subjective:    Patient ID: Cesar Knight, male    DOB: [birth year], 55 y.o.   MRN: [id number]  HPI Here for several problems. First he has had stiffness and pain in the right hip[ for 9 months. Using meloxica 7.5 mg daily. Also he wants to get off Zoplidem if possible. He currently takes 2 IR tablets every night. Lastly, he has had some urgency to urinate and difficulty emptying his bladder. No fever or pain.    Review of Systems  Constitutional: Negative.   Respiratory: Negative.   Cardiovascular: Negative.   Gastrointestinal: Negative.   Genitourinary: Positive for urgency, decreased urine volume and difficulty urinating.  Musculoskeletal: Positive for arthralgias.       Objective:   Physical Exam  Constitutional: He appears well-developed and well-nourished.  Abdominal: Soft. Bowel sounds are normal. He exhibits no distension and no mass. There is no tenderness. There is no rebound and no guarding.  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 oincrease Meloxicam to 15 mg a day. Change Zolpidem to a 12.5 CR dose at night for severla weeks, then we will titrate down. He seems to have some BPH symptoms. Try saw palmetto OTC. We will set up a cpx with labs soon to include a prostate exam

## 2011-10-01 ENCOUNTER — Other Ambulatory Visit: Payer: BC Managed Care – PPO

## 2011-10-08 ENCOUNTER — Encounter: Payer: BC Managed Care – PPO | Admitting: Family Medicine

## 2012-11-05 ENCOUNTER — Encounter: Payer: Self-pay | Admitting: Gastroenterology

## 2012-12-24 ENCOUNTER — Ambulatory Visit (INDEPENDENT_AMBULATORY_CARE_PROVIDER_SITE_OTHER): Payer: BC Managed Care – PPO | Admitting: Family Medicine

## 2012-12-24 ENCOUNTER — Encounter: Payer: Self-pay | Admitting: Family Medicine

## 2012-12-24 VITALS — BP 136/84 | HR 83 | Temp 98.7°F | Wt 227.0 lb

## 2012-12-24 DIAGNOSIS — H00019 Hordeolum externum unspecified eye, unspecified eyelid: Secondary | ICD-10-CM

## 2012-12-24 DIAGNOSIS — M545 Low back pain: Secondary | ICD-10-CM

## 2012-12-24 DIAGNOSIS — H00013 Hordeolum externum right eye, unspecified eyelid: Secondary | ICD-10-CM

## 2012-12-24 MED ORDER — ERYTHROMYCIN 5 MG/GM OP OINT: TOPICAL_OINTMENT | Freq: Four times a day (QID) | OPHTHALMIC | Status: DC

## 2012-12-24 NOTE — Addendum Note (Signed)
Addended by: Gershon Crane A on: 12/24/2012 01:07 PM   Modules accepted: Orders

## 2012-12-24 NOTE — Progress Notes (Signed)
  Subjective:    Patient ID: Cesar Knight, male    DOB: Dec 26, 1955, 57 y.o.   MRN: [id number]  HPI Here for one week of a tender bump on the right lower eyelid. Applying ice. Vision is okay.    Review of Systems  Constitutional: Negative.   Eyes: Negative.        Objective:   Physical Exam  Constitutional: He appears well-developed and well-nourished.  Eyes: Conjunctivae and EOM are normal. Pupils are equal, round, and reactive to light.  There is a tender stye on the right lower eyelid          Assessment & Plan:  Switch to hot compresses and add Ilotycin ointment qid. Recheck prn

## 2013-01-25 ENCOUNTER — Ambulatory Visit (INDEPENDENT_AMBULATORY_CARE_PROVIDER_SITE_OTHER): Payer: BC Managed Care – PPO | Admitting: Family Medicine

## 2013-01-25 ENCOUNTER — Encounter: Payer: Self-pay | Admitting: Family Medicine

## 2013-01-25 VITALS — BP 142/84 | HR 81 | Temp 98.5°F | Wt 225.0 lb

## 2013-01-25 DIAGNOSIS — M545 Low back pain, unspecified: Secondary | ICD-10-CM

## 2013-01-25 MED ORDER — HYDROCODONE-ACETAMINOPHEN 10-325 MG PO TABS: 1.0000 | ORAL_TABLET | Freq: Four times a day (QID) | ORAL | Status: DC | PRN

## 2013-01-25 NOTE — Progress Notes (Signed)
  Subjective:    Patient ID: Cesar Knight, male    DOB: 08/31/1955, 57 y.o.   MRN: [id number]  HPI Here asking for a referral to Neurosurgery. He has been dealing with low back pain that radiates down the right leg for months, and he has been working through the [place] system for this. He had a lumbar spine MRI in August showing significant showing bilateral neural foramenal narrowing. [facility] by a Control and instrumentation engineer. He is taking Sulindac with little pain relief. He has tried PT and a TENS unit with no effect. He uses a cane to walk.    Review of Systems  Constitutional: Negative.   Musculoskeletal: Positive for back pain.       Objective:   Physical Exam  Constitutional: He appears well-developed and well-nourished.  In pain  Musculoskeletal:  Decreased ROM of the lower back           Assessment & Plan:  Given Vicodin for pain. Referred to Neurosurgery.

## 2013-02-10 ENCOUNTER — Other Ambulatory Visit: Payer: Self-pay | Admitting: Neurosurgery

## 2013-02-10 ENCOUNTER — Ambulatory Visit
Admission: RE | Admit: 2013-02-10 | Discharge: 2013-02-10 | Disposition: A | Discharge status: Home / Self Care | Payer: BC Managed Care – PPO | Source: Ambulatory Visit | Attending: Neurosurgery | Admitting: Neurosurgery

## 2013-02-10 DIAGNOSIS — M5126 Other intervertebral disc displacement, lumbar region: Secondary | ICD-10-CM

## 2013-02-10 DIAGNOSIS — M4800 Spinal stenosis, site unspecified: Secondary | ICD-10-CM

## 2013-02-17 ENCOUNTER — Other Ambulatory Visit: Payer: Self-pay | Admitting: Neurosurgery

## 2013-02-28 ENCOUNTER — Ambulatory Visit: Payer: BC Managed Care – PPO

## 2013-03-04 ENCOUNTER — Encounter (HOSPITAL_COMMUNITY): Payer: Self-pay | Admitting: Pharmacy Technician

## 2013-03-09 NOTE — Pre-Procedure Instructions (Signed)
SAED HUDLOW  03/09/2013   Your procedure is scheduled on:  March 18, 2013   Report to [facility] at 5:30 AM.  Call this number if you have problems the morning of surgery: [phone]   Remember:   Do not eat food or drink liquids after midnight.   Take these medicines the morning of surgery with A SIP OF WATER: buPROPion (WELLBUTRIN), esomeprazole (NEXIUM), fexofenadine (ALLEGRA), fluticasone (FLONASE), oxyCODONE-acetaminophen (PERCOCET) - if needed for pain   Stop Vitamin B12 and all Vitamins, Aspirin, Non-steroidals (Motrin, Ibuprofen, Naproxen, Aleve), Herbal medications 03/11/13    Do not wear jewelry, make-up or nail polish.  Do not wear lotions, powders, or perfumes. You may wear deodorant.  Do not shave 48 hours prior to surgery. Men may shave face and neck.  Do not bring valuables to the hospital.  [facility] is not responsible  for any belongings or valuables.  Contacts, dentures or bridgework may not be worn into surgery.  Leave suitcase in the car. After surgery it may be brought to your room.  For patients admitted to the hospital, checkout time is 11:00 AM the day of  discharge.     Special Instructions: Shower using CHG 2 nights before surgery and the night before surgery.  If you shower the day of surgery use CHG.  Use special wash - you have one bottle of CHG for all showers.  You should use approximately 1/3 of the bottle for each shower.   Please read over the following fact sheets that you were given: Pain Booklet, Coughing and Deep Breathing, Blood Transfusion Information, MRSA Information and Surgical Site Infection Prevention

## 2013-03-10 ENCOUNTER — Encounter (HOSPITAL_COMMUNITY)
Admission: RE | Admit: 2013-03-10 | Discharge: 2013-03-10 | Disposition: A | Discharge status: Home / Self Care | Payer: BC Managed Care – PPO | Source: Ambulatory Visit | Attending: Neurosurgery | Admitting: Neurosurgery

## 2013-03-10 ENCOUNTER — Encounter (HOSPITAL_COMMUNITY): Payer: Self-pay

## 2013-03-10 DIAGNOSIS — Z01812 Encounter for preprocedural laboratory examination: Secondary | ICD-10-CM | POA: Insufficient documentation

## 2013-03-10 DIAGNOSIS — Z01818 Encounter for other preprocedural examination: Secondary | ICD-10-CM | POA: Insufficient documentation

## 2013-03-10 DIAGNOSIS — Z01810 Encounter for preprocedural cardiovascular examination: Secondary | ICD-10-CM | POA: Insufficient documentation

## 2013-03-10 HISTORY — DX: Diverticulitis of intestine, part unspecified, without perforation or abscess without bleeding: K57.92

## 2013-03-10 HISTORY — DX: Family history of other specified conditions: Z84.89

## 2013-03-10 HISTORY — DX: Congenital malformation of knee: Q74.1

## 2013-03-10 HISTORY — DX: Adverse effect of unspecified anesthetic, initial encounter: T41.45XA

## 2013-03-10 HISTORY — DX: Other complications of anesthesia, initial encounter: T88.59XA

## 2013-03-10 LAB — CBC
Hemoglobin: 13.5 g/dL (ref 13.0–17.0)
MCH: 30.7 pg (ref 26.0–34.0)
MCHC: 34 g/dL (ref 30.0–36.0)
Platelets: 177 10*3/uL (ref 150–400)

## 2013-03-10 LAB — SURGICAL PCR SCREEN: Staphylococcus aureus: NEGATIVE

## 2013-03-10 LAB — BASIC METABOLIC PANEL
Calcium: 9.7 mg/dL (ref 8.4–10.5)
GFR calc non Af Amer: 74 mL/min — ABNORMAL LOW (ref >90)
Glucose, Bld: 92 mg/dL (ref 70–99)
Sodium: 140 mEq/L (ref 135–145)

## 2013-03-10 NOTE — Progress Notes (Signed)
03/10/13 0923  OBSTRUCTIVE SLEEP APNEA  Have you ever been diagnosed with sleep apnea through a sleep study? No (Sleep study done > than 3 years ago was negative per pt)  Do you snore loudly (loud enough to be heard through closed doors)?  1  Do you often feel tired, fatigued, or sleepy during the daytime? 1  Has anyone observed you stop breathing during your sleep? 0  Do you have, or are you being treated for high blood pressure? 1  BMI more than 35 kg/m2? 0  Age over 58 years old? 1  Neck circumference greater than 40 cm/18 inches? 0  Gender: 1  Obstructive Sleep Apnea Score 5  Score 4 or greater  Results sent to PCP

## 2013-03-10 NOTE — Progress Notes (Signed)
Patient refused to wear BB armband until DOS d/t previously made plans will collect DOS

## 2013-03-17 MED ORDER — DEXAMETHASONE SODIUM PHOSPHATE 10 MG/ML IJ SOLN
10.0000 mg | INTRAMUSCULAR | Status: AC
  Administered 2013-03-18: 10 mg via INTRAVENOUS
  Filled 2013-03-17: qty 1

## 2013-03-17 MED ORDER — VANCOMYCIN HCL 10 G IV SOLR
1500.0000 mg | INTRAVENOUS | Status: AC
  Administered 2013-03-18: 1500 mg via INTRAVENOUS
  Filled 2013-03-17: qty 1500

## 2013-03-18 ENCOUNTER — Ambulatory Visit (HOSPITAL_COMMUNITY): Payer: BC Managed Care – PPO | Admitting: Anesthesiology

## 2013-03-18 ENCOUNTER — Ambulatory Visit (HOSPITAL_COMMUNITY): Payer: BC Managed Care – PPO

## 2013-03-18 ENCOUNTER — Encounter (HOSPITAL_COMMUNITY): Payer: Self-pay | Admitting: *Deleted

## 2013-03-18 ENCOUNTER — Encounter (HOSPITAL_COMMUNITY): Payer: Self-pay | Admitting: Anesthesiology

## 2013-03-18 ENCOUNTER — Encounter (HOSPITAL_COMMUNITY)
Admission: RE | Disposition: A | Discharge status: Home / Self Care | Payer: Self-pay | Source: Ambulatory Visit | Attending: Neurosurgery

## 2013-03-18 ENCOUNTER — Inpatient Hospital Stay (HOSPITAL_COMMUNITY)
Admission: RE | Admit: 2013-03-18 | Discharge: 2013-03-22 | DRG: 756 | Disposition: A | Discharge status: Home / Self Care | Payer: BC Managed Care – PPO | Source: Ambulatory Visit | Attending: Neurosurgery | Admitting: Neurosurgery

## 2013-03-18 DIAGNOSIS — T84498A Other mechanical complication of other internal orthopedic devices, implants and grafts, initial encounter: Principal | ICD-10-CM | POA: Diagnosis present

## 2013-03-18 DIAGNOSIS — F3289 Other specified depressive episodes: Secondary | ICD-10-CM | POA: Diagnosis present

## 2013-03-18 DIAGNOSIS — M5380 Other specified dorsopathies, site unspecified: Secondary | ICD-10-CM

## 2013-03-18 DIAGNOSIS — I10 Essential (primary) hypertension: Secondary | ICD-10-CM | POA: Diagnosis present

## 2013-03-18 DIAGNOSIS — M159 Polyosteoarthritis, unspecified: Secondary | ICD-10-CM | POA: Diagnosis present

## 2013-03-18 DIAGNOSIS — K219 Gastro-esophageal reflux disease without esophagitis: Secondary | ICD-10-CM | POA: Diagnosis present

## 2013-03-18 DIAGNOSIS — M48061 Spinal stenosis, lumbar region without neurogenic claudication: Secondary | ICD-10-CM

## 2013-03-18 DIAGNOSIS — F329 Major depressive disorder, single episode, unspecified: Secondary | ICD-10-CM | POA: Diagnosis present

## 2013-03-18 DIAGNOSIS — Y832 Surgical operation with anastomosis, bypass or graft as the cause of abnormal reaction of the patient, or of later complication, without mention of misadventure at the time of the procedure: Secondary | ICD-10-CM | POA: Diagnosis present

## 2013-03-18 DIAGNOSIS — F4310 Post-traumatic stress disorder, unspecified: Secondary | ICD-10-CM | POA: Diagnosis present

## 2013-03-18 SURGERY — POSTERIOR LUMBAR FUSION 1 LEVEL
Anesthesia: General | Site: Spine Lumbar | Wound class: Clean

## 2013-03-18 MED ORDER — OXYCODONE HCL 5 MG PO TABS
ORAL_TABLET | ORAL | Status: AC
  Administered 2013-03-18: 10 mg via ORAL
  Filled 2013-03-18: qty 2

## 2013-03-18 MED ORDER — PANTOPRAZOLE SODIUM 40 MG PO TBEC
40.0000 mg | DELAYED_RELEASE_TABLET | Freq: Every day | ORAL | Status: DC
  Administered 2013-03-19 – 2013-03-22 (×4): 40 mg via ORAL
  Filled 2013-03-18 (×3): qty 1

## 2013-03-18 MED ORDER — HYDROMORPHONE HCL PF 1 MG/ML IJ SOLN
0.2500 mg | INTRAMUSCULAR | Status: DC | PRN
  Administered 2013-03-18 (×2): 0.5 mg via INTRAVENOUS

## 2013-03-18 MED ORDER — LIDOCAINE-EPINEPHRINE 1 %-1:100000 IJ SOLN
INTRAMUSCULAR | Status: DC | PRN
  Administered 2013-03-18: 10 mL

## 2013-03-18 MED ORDER — BUPROPION HCL 100 MG PO TABS
100.0000 mg | ORAL_TABLET | Freq: Two times a day (BID) | ORAL | Status: DC
  Administered 2013-03-19 – 2013-03-22 (×6): 100 mg via ORAL
  Filled 2013-03-18 (×10): qty 1

## 2013-03-18 MED ORDER — SODIUM CHLORIDE 0.9 % IJ SOLN: 3.0000 mL | INTRAMUSCULAR | Status: DC | PRN

## 2013-03-18 MED ORDER — ACETAMINOPHEN 650 MG RE SUPP: 650.0000 mg | RECTAL | Status: DC | PRN

## 2013-03-18 MED ORDER — HYDROMORPHONE HCL PF 1 MG/ML IJ SOLN
INTRAMUSCULAR | Status: AC
  Administered 2013-03-18: 1 mg
  Filled 2013-03-18: qty 1

## 2013-03-18 MED ORDER — SODIUM CHLORIDE 0.9 % IR SOLN
Status: DC | PRN
  Administered 2013-03-18: 08:00:00

## 2013-03-18 MED ORDER — PROPOFOL 10 MG/ML IV BOLUS
INTRAVENOUS | Status: DC | PRN
  Administered 2013-03-18: 180 mg via INTRAVENOUS

## 2013-03-18 MED ORDER — OXYCODONE HCL 5 MG PO TABS: 10.0000 mg | ORAL_TABLET | Freq: Once | ORAL | Status: AC

## 2013-03-18 MED ORDER — LIDOCAINE HCL (CARDIAC) 20 MG/ML IV SOLN
INTRAVENOUS | Status: DC | PRN
  Administered 2013-03-18: 40 mg via INTRAVENOUS

## 2013-03-18 MED ORDER — CYCLOBENZAPRINE HCL 10 MG PO TABS
10.0000 mg | ORAL_TABLET | Freq: Three times a day (TID) | ORAL | Status: DC | PRN
  Administered 2013-03-18 – 2013-03-22 (×10): 10 mg via ORAL
  Filled 2013-03-18 (×9): qty 1

## 2013-03-18 MED ORDER — CEFAZOLIN SODIUM 1-5 GM-% IV SOLN: 1.0000 g | Freq: Three times a day (TID) | INTRAVENOUS | Status: DC

## 2013-03-18 MED ORDER — ACETAMINOPHEN 325 MG PO TABS
650.0000 mg | ORAL_TABLET | ORAL | Status: DC | PRN
  Administered 2013-03-18: 650 mg via ORAL
  Filled 2013-03-18 (×2): qty 2

## 2013-03-18 MED ORDER — BUPIVACAINE HCL (PF) 0.25 % IJ SOLN
INTRAMUSCULAR | Status: DC | PRN
  Administered 2013-03-18: 10 mL

## 2013-03-18 MED ORDER — ONDANSETRON HCL 4 MG/2ML IJ SOLN: 4.0000 mg | Freq: Once | INTRAMUSCULAR | Status: DC | PRN

## 2013-03-18 MED ORDER — THROMBIN 20000 UNITS EX SOLR
CUTANEOUS | Status: DC | PRN
  Administered 2013-03-18 (×2): via TOPICAL

## 2013-03-18 MED ORDER — TRAZODONE HCL 100 MG PO TABS
100.0000 mg | ORAL_TABLET | Freq: Every day | ORAL | Status: DC
  Administered 2013-03-18 – 2013-03-19 (×2): 100 mg via ORAL
  Filled 2013-03-18 (×6): qty 1

## 2013-03-18 MED ORDER — MENTHOL 3 MG MT LOZG: 1.0000 | LOZENGE | OROMUCOSAL | Status: DC | PRN

## 2013-03-18 MED ORDER — ARTIFICIAL TEARS OP OINT
TOPICAL_OINTMENT | OPHTHALMIC | Status: DC | PRN
  Administered 2013-03-18: 1 via OPHTHALMIC

## 2013-03-18 MED ORDER — OXYCODONE-ACETAMINOPHEN 5-325 MG PO TABS
ORAL_TABLET | ORAL | Status: AC
  Filled 2013-03-18: qty 2

## 2013-03-18 MED ORDER — DOCUSATE SODIUM 100 MG PO CAPS
100.0000 mg | ORAL_CAPSULE | Freq: Two times a day (BID) | ORAL | Status: DC
  Administered 2013-03-18 – 2013-03-22 (×8): 100 mg via ORAL
  Filled 2013-03-18 (×5): qty 1

## 2013-03-18 MED ORDER — MIDAZOLAM HCL 5 MG/5ML IJ SOLN
INTRAMUSCULAR | Status: DC | PRN
  Administered 2013-03-18 (×2): 1 mg via INTRAVENOUS

## 2013-03-18 MED ORDER — HYDROMORPHONE HCL PF 1 MG/ML IJ SOLN
INTRAMUSCULAR | Status: AC
  Administered 2013-03-18: 0.5 mg via INTRAVENOUS
  Filled 2013-03-18: qty 1

## 2013-03-18 MED ORDER — VECURONIUM BROMIDE 10 MG IV SOLR
INTRAVENOUS | Status: DC | PRN
  Administered 2013-03-18 (×2): 1 mg via INTRAVENOUS
  Administered 2013-03-18 (×2): 2 mg via INTRAVENOUS
  Administered 2013-03-18 (×3): 1 mg via INTRAVENOUS

## 2013-03-18 MED ORDER — OXYCODONE HCL 5 MG PO TABS
5.0000 mg | ORAL_TABLET | Freq: Four times a day (QID) | ORAL | Status: DC | PRN
  Administered 2013-03-18 – 2013-03-19 (×4): 5 mg via ORAL
  Filled 2013-03-18 (×5): qty 1

## 2013-03-18 MED ORDER — HYDROMORPHONE HCL PF 1 MG/ML IJ SOLN
0.5000 mg | INTRAMUSCULAR | Status: DC | PRN
  Administered 2013-03-18 – 2013-03-21 (×15): 1 mg via INTRAVENOUS
  Filled 2013-03-18 (×17): qty 1

## 2013-03-18 MED ORDER — ONDANSETRON HCL 4 MG/2ML IJ SOLN: 4.0000 mg | INTRAMUSCULAR | Status: DC | PRN

## 2013-03-18 MED ORDER — CYCLOBENZAPRINE HCL 10 MG PO TABS
ORAL_TABLET | ORAL | Status: AC
  Filled 2013-03-18: qty 1

## 2013-03-18 MED ORDER — OXYCODONE-ACETAMINOPHEN 5-325 MG PO TABS
2.0000 | ORAL_TABLET | Freq: Four times a day (QID) | ORAL | Status: DC | PRN
  Administered 2013-03-18 – 2013-03-20 (×6): 2 via ORAL
  Filled 2013-03-18 (×6): qty 2

## 2013-03-18 MED ORDER — ROCURONIUM BROMIDE 100 MG/10ML IV SOLN
INTRAVENOUS | Status: DC | PRN
  Administered 2013-03-18: 50 mg via INTRAVENOUS

## 2013-03-18 MED ORDER — NEOSTIGMINE METHYLSULFATE 1 MG/ML IJ SOLN
INTRAMUSCULAR | Status: DC | PRN
  Administered 2013-03-18: 4 mg via INTRAVENOUS

## 2013-03-18 MED ORDER — ONDANSETRON HCL 4 MG/2ML IJ SOLN
INTRAMUSCULAR | Status: DC | PRN
  Administered 2013-03-18: 4 mg via INTRAVENOUS

## 2013-03-18 MED ORDER — LACTATED RINGERS IV SOLN
INTRAVENOUS | Status: DC | PRN
  Administered 2013-03-18 (×3): via INTRAVENOUS

## 2013-03-18 MED ORDER — PHENOL 1.4 % MT LIQD: 1.0000 | OROMUCOSAL | Status: DC | PRN

## 2013-03-18 MED ORDER — HYDROCHLOROTHIAZIDE 25 MG PO TABS
25.0000 mg | ORAL_TABLET | Freq: Every day | ORAL | Status: DC
  Administered 2013-03-18 – 2013-03-22 (×5): 25 mg via ORAL
  Filled 2013-03-18 (×5): qty 1

## 2013-03-18 MED ORDER — OXYCODONE-ACETAMINOPHEN 7.5-325 MG PO TABS: 2.0000 | ORAL_TABLET | Freq: Four times a day (QID) | ORAL | Status: DC | PRN

## 2013-03-18 MED ORDER — ALUM & MAG HYDROXIDE-SIMETH 200-200-20 MG/5ML PO SUSP: 30.0000 mL | Freq: Four times a day (QID) | ORAL | Status: DC | PRN

## 2013-03-18 MED ORDER — GLYCOPYRROLATE 0.2 MG/ML IJ SOLN
INTRAMUSCULAR | Status: DC | PRN
  Administered 2013-03-18: .6 mg via INTRAVENOUS

## 2013-03-18 MED ORDER — VITAMIN B-12 1000 MCG PO TABS
1000.0000 ug | ORAL_TABLET | Freq: Every day | ORAL | Status: DC
  Administered 2013-03-18 – 2013-03-22 (×5): 1000 ug via ORAL
  Filled 2013-03-18 (×5): qty 1

## 2013-03-18 MED ORDER — VANCOMYCIN HCL 10 G IV SOLR
1250.0000 mg | Freq: Two times a day (BID) | INTRAVENOUS | Status: AC
  Administered 2013-03-18 – 2013-03-20 (×4): 1250 mg via INTRAVENOUS
  Filled 2013-03-18 (×5): qty 1250

## 2013-03-18 MED ORDER — SODIUM CHLORIDE 0.9 % IJ SOLN
3.0000 mL | Freq: Two times a day (BID) | INTRAMUSCULAR | Status: DC
  Administered 2013-03-18 – 2013-03-22 (×8): 3 mL via INTRAVENOUS

## 2013-03-18 MED ORDER — FENTANYL CITRATE 0.05 MG/ML IJ SOLN
INTRAMUSCULAR | Status: DC | PRN
  Administered 2013-03-18 (×5): 50 ug via INTRAVENOUS
  Administered 2013-03-18: 100 ug via INTRAVENOUS
  Administered 2013-03-18: 50 ug via INTRAVENOUS
  Administered 2013-03-18: 100 ug via INTRAVENOUS
  Administered 2013-03-18 (×5): 50 ug via INTRAVENOUS

## 2013-03-18 MED ORDER — HYDROMORPHONE HCL PF 1 MG/ML IJ SOLN
0.5000 mg | INTRAMUSCULAR | Status: DC | PRN
  Administered 2013-03-18 (×2): 0.5 mg via INTRAVENOUS

## 2013-03-18 MED ORDER — SODIUM CHLORIDE 0.9 % IV SOLN
250.0000 mL | INTRAVENOUS | Status: DC
  Administered 2013-03-19: 250 mL via INTRAVENOUS

## 2013-03-18 MED ORDER — SODIUM CHLORIDE 0.9 % IV SOLN
INTRAVENOUS | Status: DC | PRN
  Administered 2013-03-18: 11:00:00 via INTRAVENOUS

## 2013-03-18 MED ORDER — 0.9 % SODIUM CHLORIDE (POUR BTL) OPTIME
TOPICAL | Status: DC | PRN
  Administered 2013-03-18: 1000 mL

## 2013-03-18 MED ORDER — LORATADINE 10 MG PO TABS
10.0000 mg | ORAL_TABLET | Freq: Every day | ORAL | Status: DC
  Administered 2013-03-18 – 2013-03-22 (×5): 10 mg via ORAL
  Filled 2013-03-18 (×5): qty 1

## 2013-03-18 SURGICAL SUPPLY — 78 items
BAG DECANTER FOR FLEXI CONT (MISCELLANEOUS) ×2 IMPLANT
BENZOIN TINCTURE PRP APPL 2/3 (GAUZE/BANDAGES/DRESSINGS) ×2 IMPLANT
BLADE SURG 11 STRL SS (BLADE) ×2 IMPLANT
BLADE SURG ROTATE 9660 (MISCELLANEOUS) IMPLANT
BRUSH SCRUB EZ PLAIN DRY (MISCELLANEOUS) ×2 IMPLANT
BUR MATCHSTICK NEURO 3.0 LAGG (BURR) ×2 IMPLANT
BUR PRECISION FLUTE 6.0 (BURR) ×2 IMPLANT
CANISTER SUCTION 2500CC (MISCELLANEOUS) ×2 IMPLANT
CAP LOCKING REVERE (Cap) ×12 IMPLANT
CLOTH BEACON ORANGE TIMEOUT ST (SAFETY) ×2 IMPLANT
CONNECTOR VAR CROSS 5.5X48-60 (Connector) ×2 IMPLANT
CONT SPEC 4OZ CLIKSEAL STRL BL (MISCELLANEOUS) ×4 IMPLANT
COVER BACK TABLE 24X17X13 BIG (DRAPES) IMPLANT
COVER TABLE BACK 60X90 (DRAPES) ×2 IMPLANT
DECANTER SPIKE VIAL GLASS SM (MISCELLANEOUS) ×2 IMPLANT
DERMABOND ADVANCED (GAUZE/BANDAGES/DRESSINGS)
DERMABOND ADVANCED .7 DNX12 (GAUZE/BANDAGES/DRESSINGS) IMPLANT
DRAPE C-ARM 42X72 X-RAY (DRAPES) ×4 IMPLANT
DRAPE LAPAROTOMY 100X72X124 (DRAPES) ×2 IMPLANT
DRAPE POUCH INSTRU U-SHP 10X18 (DRAPES) ×2 IMPLANT
DRAPE PROXIMA HALF (DRAPES) IMPLANT
DRAPE SURG 17X23 STRL (DRAPES) ×2 IMPLANT
DRSG OPSITE 4X5.5 SM (GAUZE/BANDAGES/DRESSINGS) IMPLANT
DRSG OPSITE POSTOP 4X8 (GAUZE/BANDAGES/DRESSINGS) ×4 IMPLANT
DURAPREP 26ML APPLICATOR (WOUND CARE) ×2 IMPLANT
ELECT REM PT RETURN 9FT ADLT (ELECTROSURGICAL) ×2
ELECTRODE REM PT RTRN 9FT ADLT (ELECTROSURGICAL) ×1 IMPLANT
EVACUATOR 3/16  PVC DRAIN (DRAIN) ×1
EVACUATOR 3/16 PVC DRAIN (DRAIN) ×1 IMPLANT
GAUZE SPONGE 4X4 16PLY XRAY LF (GAUZE/BANDAGES/DRESSINGS) ×2 IMPLANT
GLOVE BIO SURGEON STRL SZ8 (GLOVE) ×4 IMPLANT
GLOVE BIOGEL PI IND STRL 7.0 (GLOVE) ×2 IMPLANT
GLOVE BIOGEL PI IND STRL 7.5 (GLOVE) ×1 IMPLANT
GLOVE BIOGEL PI IND STRL 8.5 (GLOVE) ×1 IMPLANT
GLOVE BIOGEL PI INDICATOR 7.0 (GLOVE) ×2
GLOVE BIOGEL PI INDICATOR 7.5 (GLOVE) ×1
GLOVE BIOGEL PI INDICATOR 8.5 (GLOVE) ×1
GLOVE ECLIPSE 7.0 STRL STRAW (GLOVE) ×2 IMPLANT
GLOVE ECLIPSE 7.5 STRL STRAW (GLOVE) IMPLANT
GLOVE EXAM NITRILE LRG STRL (GLOVE) IMPLANT
GLOVE EXAM NITRILE MD LF STRL (GLOVE) IMPLANT
GLOVE EXAM NITRILE XL STR (GLOVE) IMPLANT
GLOVE EXAM NITRILE XS STR PU (GLOVE) IMPLANT
GLOVE INDICATOR 8.5 STRL (GLOVE) ×4 IMPLANT
GLOVE SURG SS PI 8.0 STRL IVOR (GLOVE) ×4 IMPLANT
GOWN BRE IMP SLV AUR LG STRL (GOWN DISPOSABLE) ×2 IMPLANT
GOWN BRE IMP SLV AUR XL STRL (GOWN DISPOSABLE) ×4 IMPLANT
GOWN STRL REIN 2XL LVL4 (GOWN DISPOSABLE) ×2 IMPLANT
KIT BASIN OR (CUSTOM PROCEDURE TRAY) ×2 IMPLANT
KIT ROOM TURNOVER OR (KITS) ×2 IMPLANT
MASTERGRAFT STRIP 10CM ×2 IMPLANT
MATRIX MASTERGRAFT STRIP 10CM ×1 IMPLANT
MILL MEDIUM DISP (BLADE) ×2 IMPLANT
NEEDLE HYPO 25X1 1.5 SAFETY (NEEDLE) ×2 IMPLANT
NS IRRIG 1000ML POUR BTL (IV SOLUTION) ×2 IMPLANT
PACK LAMINECTOMY NEURO (CUSTOM PROCEDURE TRAY) ×2 IMPLANT
PAD ARMBOARD 7.5X6 YLW CONV (MISCELLANEOUS) ×10 IMPLANT
PATTIES SURGICAL 1X1 (DISPOSABLE) ×4 IMPLANT
PUTTY BONE DBX 5CC MIX (Putty) ×2 IMPLANT
ROD STRAIGHT 5.5MMX75MM (Rod) ×4 IMPLANT
SCREW PEDICLE 6.5MMX35MM (Screw) ×4 IMPLANT
SCREW PEDICLE 6.5MMX45MM (Screw) ×8 IMPLANT
SCREW PEDICLE 6.5X45 (Screw) ×4 IMPLANT
SPACER CALIBER 10X22 12-17 15D (Spacer) ×4 IMPLANT
SPONGE GAUZE 4X4 12PLY (GAUZE/BANDAGES/DRESSINGS) IMPLANT
SPONGE LAP 4X18 X RAY DECT (DISPOSABLE) IMPLANT
SPONGE SURGIFOAM ABS GEL 100 (HEMOSTASIS) ×4 IMPLANT
STRIP CLOSURE SKIN 1/2X4 (GAUZE/BANDAGES/DRESSINGS) ×4 IMPLANT
SUT VIC AB 0 CT1 18XCR BRD8 (SUTURE) ×2 IMPLANT
SUT VIC AB 0 CT1 8-18 (SUTURE) ×2
SUT VIC AB 2-0 CT1 18 (SUTURE) ×4 IMPLANT
SUT VICRYL 4-0 PS2 18IN ABS (SUTURE) ×2 IMPLANT
SYR 20ML ECCENTRIC (SYRINGE) ×2 IMPLANT
TOWEL OR 17X24 6PK STRL BLUE (TOWEL DISPOSABLE) ×2 IMPLANT
TOWEL OR 17X26 10 PK STRL BLUE (TOWEL DISPOSABLE) ×2 IMPLANT
TRAY FOLEY CATH 14FRSI W/METER (CATHETERS) IMPLANT
TRAY FOLEY CATH 16FRSI W/METER (SET/KITS/TRAYS/PACK) ×2 IMPLANT
WATER STERILE IRR 1000ML POUR (IV SOLUTION) ×2 IMPLANT

## 2013-03-18 NOTE — Anesthesia Postprocedure Evaluation (Signed)
  Anesthesia Post-op Note  Patient: TYRIQUE SPORN  Procedure(s) Performed: Procedure(s): LUMBAR FOUR-FIVE WITH POSTERIOR LATERAL FUSION WITH INSTRUMENTATION,LUMBAR FIVE-SACRAL-ONE POSTERIOR LUMBAR INTERBODY FUSION WITH DECOMPRESSION LUMBAR LAMINECTOMY (N/A)  Patient Location: PACU  Anesthesia Type:General  Level of Consciousness: awake, alert  and oriented  Airway and Oxygen Therapy: Patient Spontanous Breathing and Patient connected to nasal cannula oxygen  Post-op Pain: mild  Post-op Assessment: Post-op Vital signs reviewed, Patient's Cardiovascular Status Stable, Respiratory Function Stable, Patent Airway and Pain level controlled  Post-op Vital Signs: stable  Complications: No apparent anesthesia complications

## 2013-03-18 NOTE — Preoperative (Signed)
Beta Blockers   Reason not to administer Beta Blockers:Not Applicable 

## 2013-03-18 NOTE — Op Note (Signed)
Preoperative diagnosis: Severe lumbar spinal stenosis lateral recess stenosis and foraminal stenosis of the L5 and S1 nerve roots from lumbar spinal stenosis at L5-S1 as well as a pseudoarthrosis L4-5  Postoperative diagnosis: Same  Procedure: 1 reexploration of fusion L4-5 decompressive lumbar laminectomy L5-S1 and excess requiring more work than would be needed with a standard interbody fusion.  #2 posterior lumbar interbody fusion L5-S1 using expandable peek cages packed with local autograft mixed DBX  Every pedicle screw fixation L4-S1 using the globus Revere 5.5 pedicle to system  #4 posterior lateral arthrodesis L4-S1 using locally harvested autograft mixed with DBX as well as master graft  Number by placement of large Hemovac drain  Surgeon: Jillyn Hidden Camden Knotek  Assistant:Neelesh Nundkumar  Anesthesia: Gen.  EBL: Approximately 1 L  History of present illness: Patient is a pleasant 56 to progress worsening back and bilateral leg pain worse on the right consistent with an L5 and S1 nerve root pattern. Patient undergone previous L4-5 anterior lumbar interbody fusion many years ago workup showed a pseudoarthrosis at L4-5 level and severe degenerative collapse and lumbar spinal stenosis at L5-S1 causing severe foraminal stenosis of the L5-S1 nerve roots. Patient failed all forms of surgery but his progression of clinical syndrome failed conservative treatment imaging findings I recommended redo fusion L4-5 and a decompressive lumbar laminectomy and interbody fusion L5-S1 I extensively reviewed the risks and benefits of the operation the patient as well as perioperative course and expectations of outcome alternatives of surgery and he understood and agreed to proceed forward.  Operative procedure: Patient brought into the or was induced under general anesthesia positioned prone the Wilson frame his back was prepped and draped in routine sterile fashion. A midline incision was made after infiltration  of 10 cc lidocaine with epi and Bovie light cautery was used to take down the subcutaneous tissues and subperiosteal dissections care lamina of L5 and S1 bilaterally. The lamina and DPs at L4 were also exposed interoperative x-ray confirmed identification of the appropriate level then a high-speed drill was drilled in the facet complex at L5-S1 spinous processes with central decompression was begun to bleed medial facetectomies were performed and aggressive and radical foraminotomies were carried out of the L5 and S1 nerve roots. There was marked spondylitic and lateral recess stenosis compression from large spurs coming off the superior tickling facet a displacing the undersurface of the L5 nerve root. This is all teased off of the nerve root and removed in piecemeal fashion with a 3 and 4 mm Kerrison punch. After adequate exposure that she the lateral disc space was identify with aggressive abutting the superior tickling facet the epidural veins accordingly this is packed with Gelfoam attention was taken to pedicle screw placement using a high-speed drill pilot holes were drilled using fluoroscopy each step along the way as well as internal and external bony landmarks the the pedicles and probed P. Probed again and 6 5 x 45 screws inserted L4 and L5 6 5 x 35 at S1 the screws at L4 and L5 were now placed until the and to allow access to the TPS and lateral facet complexes for posterior lateral fusion L4-5. Interbody work was in begun interspace was incised bilaterally distractors were sequentially placed up to a 12 then the endplates and interspace was cleaned out the contralateral side using rotating cutters Epstein curettes and pituitary rongeurs. After adequate endplate preparation been achieved a 12 expandable cage was packed with local autograft mixed DBX and inserted this is explained returns  to approximately 14 mm this had good apposition the endplates and felt to help reduce the degenerative deformity. Then  the contralateral disc space was then prepared a lot of interbody bone was placed with autograft mixed DBX centrally the cotyloid cages in place again expanded, a similar size. After only about work been done the L5-S1 foramen work significantly opened up.   the wound was copiously irrigated meticulous hemostasis was maintained aggressive decortication was undertaken along the TPs or lateral gutters the remainder the local bone mixed with DBX was packed posterior laterally from L4 down to S1 on the right L4 and L5 on the left. Then the 2 screws were placed and they had not been placed earlier after all screws and placed the fluoroscopy confirmed good position of all the implants the then the foraminal reinspected to confirm no migration of graft material rods were then placed and tightened down a cross-link was applied Gelfoam was laid up the dura large Hemovac drain was placed the wounds closed in layers with after Vicryl the skin was closed running 4 septic or benzoin and Steri-Strips were applied patient recovered in stable condition. At the end of case on it counts and sponge counts were correct the

## 2013-03-18 NOTE — Anesthesia Preprocedure Evaluation (Signed)
Anesthesia Evaluation  Patient identified by MRN, date of birth, ID band Patient awake    Reviewed: Allergy & Precautions, H&P , NPO status , Patient's Chart, lab work & pertinent test results  Airway Mallampati: II      Dental  (+) Teeth Intact and Dental Advisory Given   Pulmonary  breath sounds clear to auscultation        Cardiovascular Rhythm:Regular Rate:Normal     Neuro/Psych    GI/Hepatic   Endo/Other    Renal/GU      Musculoskeletal   Abdominal   Peds  Hematology   Anesthesia Other Findings   Reproductive/Obstetrics                           Anesthesia Physical Anesthesia Plan  ASA: II  Anesthesia Plan: General   Post-op Pain Management:    Induction: Intravenous  Airway Management Planned: Oral ETT  Additional Equipment:   Intra-op Plan:   Post-operative Plan: Extubation in OR  Informed Consent: I have reviewed the patients History and Physical, chart, labs and discussed the procedure including the risks, benefits and alternatives for the proposed anesthesia with the patient or authorized representative who has indicated his/her understanding and acceptance.   Dental advisory given  Plan Discussed with: CRNA and Anesthesiologist  Anesthesia Plan Comments: (Lumbar pseudoarthrosis with chronic LBP Htn GERD)        Anesthesia Quick Evaluation

## 2013-03-18 NOTE — Anesthesia Procedure Notes (Signed)
Procedure Name: Intubation Date/Time: 03/18/2013 7:34 AM Performed by: Gayla Medicus Pre-anesthesia Checklist: Patient identified, Timeout performed, Emergency Drugs available, Patient being monitored and Suction available Patient Re-evaluated:Patient Re-evaluated prior to inductionOxygen Delivery Method: Circle system utilized Preoxygenation: Pre-oxygenation with 100% oxygen Intubation Type: IV induction Ventilation: Mask ventilation without difficulty and Oral airway inserted - appropriate to patient size Laryngoscope Size: Mac and 4 Grade View: Grade I Tube type: Oral Tube size: 7.5 mm Number of attempts: 1 Airway Equipment and Method: Stylet Placement Confirmation: ETT inserted through vocal cords under direct vision,  positive ETCO2 and breath sounds checked- equal and bilateral Secured at: 23 cm Tube secured with: Tape Dental Injury: Teeth and Oropharynx as per pre-operative assessment

## 2013-03-18 NOTE — Transfer of Care (Signed)
Immediate Anesthesia Transfer of Care Note  Patient: Cesar Knight  Procedure(s) Performed: Procedure(s): LUMBAR FOUR-FIVE WITH POSTERIOR LATERAL FUSION WITH INSTRUMENTATION,LUMBAR FIVE-SACRAL-ONE POSTERIOR LUMBAR INTERBODY FUSION WITH DECOMPRESSION LUMBAR LAMINECTOMY (N/A)  Patient Location: PACU  Anesthesia Type:General  Level of Consciousness: awake  Airway & Oxygen Therapy: Patient Spontanous Breathing  Post-op Assessment: Report given to PACU RN and Post -op Vital signs reviewed and stable  Post vital signs: Reviewed and stable  Complications: No apparent anesthesia complications

## 2013-03-18 NOTE — H&P (Signed)
Cesar Knight is an 57 y.o. male.   Chief Complaint: Back and right leg pain HPI: Patient is very pleasant 57 year old gentleman who's had progressive worsening back and probably right leg pain and discomfort is a worse in the last several weeks and months. He undergone a previous anterior lumbar interbody fusion using presumptively the BAK cages that has pseudoarthrosis. He has some interbody growth but no bruising down and the facets are still open. 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 Patient failed all forms of conservative treatment and due to his progression of clinical syndrome and imaging findings and failure conservative treatment I recommended posterior lumbar interbody fusion L5-S1 as well as a redo fusion L4-5 consisting of pedicle screws and posterior lateral bone. I have extensively reviewed the risks and benefits of the operation with him as well as perioperative course expectations about alternatives of surgery he understands and agrees to proceed forward.  Past Medical History  Diagnosis Date  . Depression   . GERD (gastroesophageal reflux disease)     per Dr. Russella Dar  . Hypertension   . Allergy   . Headache(784.0)   . Insomnia   . Subdural hematoma 1997  . Erectile dysfunction   . Hemorrhoids   . Low back pain   . Osteoarthritis   . Post traumatic stress disorder (PTSD)     from emotional trauma during serivce in the [employer]  . Complication of anesthesia     difficulty urinating   . Family history of anesthesia complication     Mother reports that it takes a while for her to wake up  . Diverticulitis   . Bipartite patella     Past Surgical History  Procedure Laterality Date  . Burr hole for subdural hematoma  1997  . Lumbar laminectomy  2000  . Vasectomy    . Knee arthroscopy  2005    right  knee, repeat right knee arthoscopy 01/29/10 per Dr. Dorene Grebe  . Ulnar nerve transposition    . Cataract extraction Right 1985    Family History  Problem Relation Age of Onset  . Alcohol abuse    . Cancer      colon first degree relative  . Diabetes      first degree relative  . Hyperlipidemia      family history  . Hypertension      family history  . Mental illness      family history  . Sudden death      family history  . Heart disease      family history   Social History:  reports that he quit smoking about 10 years ago. His smoking use included Cigarettes. He has a 15 pack-year smoking history. He quit smokeless tobacco use about 10 years ago. He reports that he drinks about 2.0 ounces of alcohol per week. He reports that he does not use illicit drugs.  Allergies:  Allergies  Allergen Reactions  . Avelox [Moxifloxacin Hcl In Nacl] Anaphylaxis  . Cefdinir     REACTION: itching, swelling  . Prochlorperazine Edisylate     REACTION: "Flighty" compazine    Medications Prior to Admission  Medication Sig Dispense Refill  . buPROPion (WELLBUTRIN) 100 MG tablet Take 100 mg by mouth 2 (two) times daily.      Marland Kitchen esomeprazole (NEXIUM) 40 MG  capsule Take 40 mg by mouth 2 (two) times daily as needed. For heartburn      . fexofenadine (ALLEGRA) 180 MG tablet Take 180 mg by mouth daily.        . fluticasone (FLONASE) 50 MCG/ACT nasal spray Place 2 sprays into the nose daily as needed for rhinitis.       . hydrochlorothiazide 25 MG tablet Take 25 mg by mouth daily.       Marland Kitchen oxyCODONE-acetaminophen (PERCOCET) 7.5-325 MG per tablet Take 2 tablets by mouth every 6 (six) hours as needed for pain.      . traZODone (DESYREL) 100 MG tablet Take 100 mg by mouth at bedtime.      . vitamin B-12 (CYANOCOBALAMIN) 1000 MCG tablet Take 1,000 mcg by mouth daily.      . sildenafil (VIAGRA) 100 MG tablet Take 100 mg by mouth daily as needed for erectile dysfunction.        Results for orders placed  during the hospital encounter of 03/18/13 (from the past 48 hour(s))  TYPE AND SCREEN     Status: None   Collection Time    03/18/13  6:10 AM      Result Value Range   ABO/RH(D) PENDING     Antibody Screen PENDING     Sample Expiration 03/21/2013     No results found.  Review of Systems  Constitutional: Negative.   HENT: Negative.   Eyes: Negative.   Respiratory: Negative.   Cardiovascular: Negative.   Gastrointestinal: Negative.   Genitourinary: Negative.   Musculoskeletal: Positive for myalgias and back pain.  Skin: Negative.   Neurological: Positive for tingling and sensory change.  Endo/Heme/Allergies: Negative.   Psychiatric/Behavioral: Negative.     Blood pressure 146/81, pulse 73, temperature 97.2 F (36.2 C), temperature source Oral, resp. rate 18, SpO2 95.00%. Physical Exam  Constitutional: He is oriented to person, place, and time. He appears well-developed.  HENT:  Head: Normocephalic.  Eyes: Pupils are equal, round, and reactive to light.  Neck: Normal range of motion.  Cardiovascular: Normal rate.   Respiratory: Effort normal.  GI: Soft.  Musculoskeletal: Normal range of motion.  Neurological: He is alert and oriented to person, place, and time. He has normal strength. GCS eye subscore is 4. GCS verbal subscore is 5. GCS motor subscore is 6.  Reflex Scores:      Patellar reflexes are 0 on the right side and 0 on the left side.      Achilles reflexes are 0 on the right side and 0 on the left side. Strength is 5 of 5 in his iliopsoas, quads, hamstrings, gastric him into tibialis, and EHL.     Assessment/Plan 57 year old presents for an L4-5 L5-S1 fusion   Amanda Pote P 03/18/2013, 7:16 AM

## 2013-03-19 NOTE — Progress Notes (Signed)
Subjective: Patient reports Patient is a well no leg pain back is sore but manageable progressive mobilization today  Objective: Vital signs in last 24 hours: Temp:  [96.8 F (36 C)-99.3 F (37.4 C)] 98 F (36.7 C) (08/23 0507) Pulse Rate:  [65-96] 79 (08/23 0507) Resp:  [11-18] 18 (08/23 0507) BP: (116-154)/(69-98) 129/69 mmHg (08/23 0507) SpO2:  [95 %-100 %] 96 % (08/23 0507) Weight:  [105.824 kg (233 lb 4.8 oz)] 105.824 kg (233 lb 4.8 oz) (08/22 2155)  Intake/Output from previous day: 08/22 0701 - 08/23 0700 In: 3385 [I.V.:2800; Blood:160] Out: 2900 [Urine:1950; Drains:450; Blood:500] Intake/Output this shift: Total I/O In: 180 [P.O.:180] Out: -   Awake alert strength out of 5 wound clean and dry  Lab Results: No results found for this basename: WBC, HGB, HCT, PLT,  in the last 72 hours BMET No results found for this basename: NA, K, CL, CO2, GLUCOSE, BUN, CREATININE, CALCIUM,  in the last 72 hours  Studies/Results: Dg Lumbar Spine 2-3 Views  03/18/2013   *RADIOLOGY REPORT*  Clinical Data: L4-L5 and L5-S1 posterior lumbar interbody fusion  DG C-ARM 61-120 MIN, LUMBAR SPINE - 2-3 VIEW  Technique: Intraoperative spot images obtained and submitted for interpretation.  Fluoroscopy time:  1 minute 4 seconds were reported.  Please see operative note for additional detail.  Comparison:  Preoperative radiographs 02/07/2013  Findings: Intraoperative spot radiographs demonstrate bilateral pedicle screws at L4, L5 and S1.  There is an interbody graft at L5- S1.  No significant change in alignment or evidence of acute complication.  Laminectomy defect at L5 also noted.  IMPRESSION: Intraoperative spot radiographs demonstrate L5 laminectomy, interbody graft placement at L5-S1 and placement of a bilateral pedicle screws at L4, L5 and L1.   Original Report Authenticated By: Malachy Moan, M.D.   Dg C-arm 61-120 Min  03/18/2013   *RADIOLOGY REPORT*  Clinical Data: L4-L5 and L5-S1 posterior  lumbar interbody fusion  DG C-ARM 61-120 MIN, LUMBAR SPINE - 2-3 VIEW  Technique: Intraoperative spot images obtained and submitted for interpretation.  Fluoroscopy time:  1 minute 4 seconds were reported.  Please see operative note for additional detail.  Comparison:  Preoperative radiographs 02/07/2013  Findings: Intraoperative spot radiographs demonstrate bilateral pedicle screws at L4, L5 and S1.  There is an interbody graft at L5- S1.  No significant change in alignment or evidence of acute complication.  Laminectomy defect at L5 also noted.  IMPRESSION: Intraoperative spot radiographs demonstrate L5 laminectomy, interbody graft placement at L5-S1 and placement of a bilateral pedicle screws at L4, L5 and L1.   Original Report Authenticated By: Malachy Moan, M.D.    Assessment/Plan: Posterior day 2 from an L5-S1 posterior lumbar interbody fusion and redo posterior lateral fusion L4-5 .Progressive mobilization with physical therapy possible discharge tomorrow or Monday  LOS: 1 day     Illene Sweeting P 03/19/2013, 8:26 AM

## 2013-03-19 NOTE — Evaluation (Signed)
Physical Therapy Evaluation Patient Details Name: Cesar Knight MRN: [id number] DOB: [birth year] Today's Date: 03/19/2013 Time: 1011-1056 PT Time Calculation (min): 45 min  PT Assessment / Plan / Recommendation History of Present Illness  57 y.o male admitted for  L5-S1 posterior lumbar interbody fusion and redo posterior lateral fusion L4  Clinical Impression  Patient is s/p lumbar fusion surgery resulting in functional limitations due to the deficits listed below (see PT Problem List). Initiated all back education and precautions.   Patient will benefit from skilled PT to increase their independence and safety with mobility to allow discharge to the venue listed below.       PT Assessment  Patient needs continued PT services    Follow Up Recommendations  Home health PT;Supervision for mobility/OOB    Does the patient have the potential to tolerate intense rehabilitation      Barriers to Discharge        Equipment Recommendations  None recommended by PT    Recommendations for Other Services     Frequency Min 5X/week    Precautions / Restrictions Precautions Precautions: Back Precaution Comments: Pt and wife instructed in back precautions Required Braces or Orthoses: Spinal Brace Spinal Brace: Lumbar corset;Applied in sitting position   Pertinent Vitals/Pain       Mobility  Bed Mobility Bed Mobility: Rolling Left;Left Sidelying to Sit Rolling Left: 4: Min assist Left Sidelying to Sit: 4: Min assist;HOB flat;With rails Details for Bed Mobility Assistance: vc's for technique, minimal truncal assist Transfers Transfers: Sit to Stand;Stand to Sit Sit to Stand: 4: Min assist;With upper extremity assist;From bed Stand to Sit: 4: Min guard;With upper extremity assist;To chair/3-in-1 Details for Transfer Assistance: vc's for hand placement; min lift assist Ambulation/Gait Ambulation/Gait Assistance: 4: Min guard Ambulation Distance (Feet): 150 Feet Assistive device:  Rolling walker Ambulation/Gait Assistance Details: generally steady, but guarded Gait Pattern: Step-through pattern Gait velocity: slower Stairs: No    Exercises     PT Diagnosis:    PT Problem List: Decreased activity tolerance;Decreased mobility;Decreased knowledge of use of DME;Decreased knowledge of precautions;Pain PT Treatment Interventions: DME instruction;Gait training;Functional mobility training;Therapeutic activities;Patient/family education     PT Goals(Current goals can be found in the care plan section) Acute Rehab PT Goals Patient Stated Goal: To get better PT Goal Formulation: With patient Time For Goal Achievement: 03/26/13 Potential to Achieve Goals: Good  Visit Information  History of Present Illness: 57 y.o male admitted for  L5-S1 posterior lumbar interbody fusion and redo posterior lateral fusion L4       Prior Functioning  Home Living Family/patient expects to be discharged to:: Private residence Living Arrangements: Spouse/significant other Available Help at Discharge: Family;Available 24 hours/day Type of Home: House Home Layout: Two level Alternate Level Stairs-Number of Steps: full flight Home Equipment: Walker - 2 wheels;Cane - single point Prior Function Level of Independence: Independent with assistive device(s) Communication Communication: No difficulties Dominant Hand: Right    Cognition  Cognition Arousal/Alertness: Awake/alert Behavior During Therapy: WFL for tasks assessed/performed Overall Cognitive Status: Within Functional Limits for tasks assessed    Extremity/Trunk Assessment Upper Extremity Assessment Upper Extremity Assessment: Overall WFL for tasks assessed Lower Extremity Assessment Lower Extremity Assessment: Overall WFL for tasks assessed   Balance    End of Session PT - End of Session Equipment Utilized During Treatment: Back brace Activity Tolerance: Patient tolerated treatment well Patient left: in chair;with call  bell/phone within reach;with family/visitor present Nurse Communication: Mobility status  GP  Cesar Knight, Cesar Knight 03/19/2013, 1:19 PM 03/19/2013  Cesar Knight, PT [phone]  (pager)

## 2013-03-19 NOTE — Evaluation (Signed)
Occupational Therapy Evaluation Patient Details Name: Cesar ERNEY MRN: [id number] DOB: [birth year] Today's Date: 03/19/2013 Time: 6578-4696 OT Time Calculation (min): 43 min  OT Assessment / Plan / Recommendation History of present illness 57 y.o male admitted for  L5-S1 posterior lumbar interbody fusion and redo posterior lateral fusion L4   Clinical Impression   Patient is s/p L5-S1 PLIF surgery resulting in the deficits listed below (see OT Problem List). Pt is moving well, anticipate good progress.  Patient will benefit from skilled OT to increase their safety and independence with ADL and functional mobility for ADL (while adhering to their precautions) to facilitate discharge to venue listed below.      OT Assessment  Patient needs continued OT Services    Follow Up Recommendations  No OT follow up;Supervision - Intermittent    Barriers to Discharge      Equipment Recommendations  None recommended by OT    Recommendations for Other Services    Frequency  Min 2X/week    Precautions / Restrictions Precautions Precautions: Back Precaution Comments: Pt and wife instructed in back precautions Required Braces or Orthoses: Spinal Brace Spinal Brace: Lumbar corset;Applied in sitting position   Pertinent Vitals/Pain     ADL  Eating/Feeding: Independent Where Assessed - Eating/Feeding: Chair Grooming: Wash/dry hands;Wash/dry face;Teeth care;Set up Where Assessed - Grooming: Supported sitting Upper Body Bathing: Supervision/safety Where Assessed - Upper Body Bathing: Unsupported sitting Lower Body Bathing: Moderate assistance Where Assessed - Lower Body Bathing: Supported sit to stand Upper Body Dressing: Supervision/safety Where Assessed - Upper Body Dressing: Unsupported sitting Lower Body Dressing: Maximal assistance Where Assessed - Lower Body Dressing: Supported sit to stand Toilet Transfer: Hydrographic surveyor Method: Sit to stand;Stand Financial trader: Raised toilet seat with arms (or 3-in-1 over toilet) Toileting - Clothing Manipulation and Hygiene: Min guard Where Assessed - Glass blower/designer Manipulation and Hygiene: Standing Equipment Used: Back brace;Rolling walker Transfers/Ambulation Related to ADLs: min guard assist with RW ADL Comments: Pt instructed in back precautions.  He is unable to cross ankles over knees for LB ADLs.  He was able to do so PTA, but with significant effort - will likely benefit from AE instruction.  Wife very supportive    OT Diagnosis: Acute pain;Generalized weakness  OT Problem List: Decreased strength;Decreased activity tolerance;Decreased knowledge of use of DME or AE;Decreased knowledge of precautions;Pain OT Treatment Interventions: Self-care/ADL training;DME and/or AE instruction;Therapeutic activities;Patient/family education   OT Goals(Current goals can be found in the care plan section) Acute Rehab OT Goals Patient Stated Goal: To get better OT Goal Formulation: With patient/family Time For Goal Achievement: 03/26/13 Potential to Achieve Goals: Good ADL Goals Pt Will Perform Grooming: with modified independence;standing Pt Will Perform Lower Body Bathing: with modified independence;sit to/from stand;with adaptive equipment Pt Will Perform Lower Body Dressing: with modified independence;with adaptive equipment;sit to/from stand Pt Will Transfer to Toilet: with modified independence;ambulating;bedside commode Pt Will Perform Toileting - Clothing Manipulation and hygiene: with modified independence;sit to/from stand Pt Will Perform Tub/Shower Transfer: Shower transfer;with modified independence;ambulating;shower seat;rolling walker Additional ADL Goal #1: Pt will be independent with back precautions during all BADLs and functional mobility   Visit Information  Last OT Received On: 03/19/13 PT/OT Co-Evaluation/Treatment: Yes History of Present Illness: 57 y.o male admitted for  L5-S1  posterior lumbar interbody fusion and redo posterior lateral fusion L4       Prior Functioning     Home Living Family/patient expects to be discharged to:: Private residence Living  Arrangements: Spouse/significant other Available Help at Discharge: Family;Available 24 hours/day Type of Home: House Home Layout: Two level Alternate Level Stairs-Number of Steps: full flight Home Equipment: Walker - 2 wheels;Cane - single point Prior Function Level of Independence: Independent with assistive device(s) Communication Communication: No difficulties Dominant Hand: Right         Vision/Perception     Cognition  Cognition Arousal/Alertness: Awake/alert Behavior During Therapy: WFL for tasks assessed/performed Overall Cognitive Status: Within Functional Limits for tasks assessed    Extremity/Trunk Assessment Upper Extremity Assessment Upper Extremity Assessment: Overall WFL for tasks assessed Lower Extremity Assessment Lower Extremity Assessment: Overall WFL for tasks assessed     Mobility Bed Mobility Bed Mobility: Rolling Left;Left Sidelying to Sit Rolling Left: 4: Min assist Left Sidelying to Sit: 4: Min assist;HOB flat;With rails Details for Bed Mobility Assistance: vc's for technique, minimal truncal assist Transfers Sit to Stand: 4: Min assist;With upper extremity assist;From bed Stand to Sit: 4: Min guard;With upper extremity assist;To chair/3-in-1 Details for Transfer Assistance: vc's for hand placement; min lift assist     Exercise     Balance     End of Session OT - End of Session Equipment Utilized During Treatment: Rolling walker;Back brace Activity Tolerance: Patient limited by pain Patient left: in chair;with call bell/phone within reach;with family/visitor present Nurse Communication: Mobility status  GO     Bernie Ransford M 03/19/2013, 1:17 PM

## 2013-03-20 MED ORDER — OXYCODONE-ACETAMINOPHEN 5-325 MG PO TABS
2.0000 | ORAL_TABLET | ORAL | Status: DC | PRN
  Administered 2013-03-20 – 2013-03-22 (×9): 2 via ORAL
  Filled 2013-03-20 (×9): qty 2

## 2013-03-20 MED ORDER — OXYCODONE HCL 5 MG PO TABS
5.0000 mg | ORAL_TABLET | ORAL | Status: DC | PRN
  Administered 2013-03-20 – 2013-03-22 (×7): 5 mg via ORAL
  Filled 2013-03-20 (×8): qty 1

## 2013-03-20 MED ORDER — ZOLPIDEM TARTRATE 5 MG PO TABS
5.0000 mg | ORAL_TABLET | Freq: Every evening | ORAL | Status: DC | PRN
  Administered 2013-03-20 – 2013-03-21 (×2): 5 mg via ORAL
  Filled 2013-03-20 (×4): qty 1

## 2013-03-20 NOTE — Progress Notes (Signed)
Patient ID: Cesar Knight, male   DOB: [birth year], 57 y.o.   MRN: [id number] BP 134/65  Pulse 95  Temp(Src) 99.3 F (37.4 C) (Oral)  Resp 20  Ht 6\' 2"  (1.88 m)  Wt 105.824 kg (233 lb 4.8 oz)  BMI 29.94 kg/m2  SpO2 95% Alert and oriented x 4 Moving all extremities well Wound is clean, dry, no signs of infection, drain removed today Continue with pt Trying to take only oral pain medication today

## 2013-03-20 NOTE — Progress Notes (Signed)
Physical Therapy Treatment Patient Details Name: Cesar Knight MRN: [id number] DOB: 12/21/1955 Today's Date: 03/20/2013 Time: 7829-5621 PT Time Calculation (min): 23 min  PT Assessment / Plan / Recommendation  History of Present Illness     PT Comments   Patient needs to practice stairs next session.     Follow Up Recommendations  Home health PT;Supervision for mobility/OOB     Does the patient have the potential to tolerate intense rehabilitation     Barriers to Discharge        Equipment Recommendations  None recommended by PT    Recommendations for Other Services    Frequency Min 5X/week   Progress towards PT Goals Progress towards PT goals: Progressing toward goals  Plan Current plan remains appropriate    Precautions / Restrictions Precautions Precautions: Back Precaution Comments: Reviewed 3/3 back precautions. Required Braces or Orthoses: Spinal Brace Spinal Brace: Lumbar corset;Applied in sitting position   Pertinent Vitals/Pain 8/10    Mobility  Bed Mobility Bed Mobility: Sit to Sidelying Left;Rolling Right Rolling Right: 6: Modified independent (Device/Increase time);With rail Sit to Sidelying Left: 4: Min assist Details for Bed Mobility Assistance: verbal cues for technique/precautions, assist with BLE Transfers Sit to Stand: 4: Min assist;With armrests;From chair/3-in-1 Stand to Sit: 4: Min guard;To bed;With upper extremity assist Details for Transfer Assistance: verbal cues for hand placement, sequencing Ambulation/Gait Ambulation/Gait Assistance: 4: Min guard Ambulation Distance (Feet): 200 Feet Assistive device: Rolling walker Gait Pattern: Step-through pattern;Antalgic Gait velocity: decreased    Exercises     PT Diagnosis:    PT Problem List:   PT Treatment Interventions:     PT Goals (current goals can now be found in the care plan section)    Visit Information  Last PT Received On: 03/20/13 Assistance Needed: +1    Subjective  Data      Cognition  Cognition Arousal/Alertness: Awake/alert Behavior During Therapy: WFL for tasks assessed/performed Overall Cognitive Status: Within Functional Limits for tasks assessed    Balance     End of Session PT - End of Session Equipment Utilized During Treatment: Gait belt;Back brace Activity Tolerance: Patient tolerated treatment well Patient left: in bed;with call bell/phone within reach;with family/visitor present;with nursing/sitter in room Nurse Communication: Mobility status   GP     Ilda Foil 03/20/2013, 9:34 AM  Aida Raider, PT  Office # [phone] Pager [phone]

## 2013-03-21 MED ORDER — ZOLPIDEM TARTRATE 5 MG PO TABS: 5.0000 mg | ORAL_TABLET | Freq: Every evening | ORAL | Status: DC | PRN

## 2013-03-21 MED ORDER — CYCLOBENZAPRINE HCL 10 MG PO TABS: 10.0000 mg | ORAL_TABLET | Freq: Three times a day (TID) | ORAL | Status: DC | PRN

## 2013-03-21 MED ORDER — OXYCODONE HCL 5 MG PO TABS: 15.0000 mg | ORAL_TABLET | ORAL | Status: DC | PRN

## 2013-03-21 NOTE — Discharge Summary (Signed)
Physician Discharge Summary  Patient ID: Cesar Knight MRN: [id number] DOB/AGE: [birth year] 57 y.o.  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 *   Discharged Condition: good  Hospital Course: patient was then also underwent a reexploration of fusion removal of hardware and redo fusion L4-5 as well as a decompressive laminectomy and lumbar fusion L5-S1 postoperative patient did very well recovered in the floor on the floor he was convalescing well and living and voiding spontaneously and tolerating regular diet. Pain became under well and controlled the posterior day 3 and was still be discharged home scheduled followup approximately 1-2 weeks. Patient be discharged home health oxycodone and Flexeril for pain.  Consults: Significant Diagnostic Studies: Treatments:decompressive lumbar laminectomy and posterior lumbar interbody fusion L5-S1 redo posterior lateral fusion L4-5 Discharge Exam: Blood pressure 162/83, pulse 112, temperature 99.7 F (37.6 C), temperature source Oral, resp. rate 20, height 6\' 2"  (1.88 m), weight 105.824 kg (233 lb 4.8 oz), SpO2 99.00%. Strength out of 5 wound clean and dry  Disposition: home  Discharge Orders   Future Orders Complete By Expires   Face-to-face encounter (required for Medicare/Medicaid patients)  As directed    Comments:     I Leone Putman P certify that this patient is under my care and that I, or a nurse practitioner or physician's assistant working with me, had a face-to-face encounter that meets the physician face-to-face encounter requirements with this patient on 03/21/2013. The encounter with the patient was in whole, or in part for the following medical condition(s) which is the primary reason for home health care (List medical condition): lumbar stenosis   Questions:     The  encounter with the patient was in whole, or in part, for the following medical condition, which is the primary reason for home health care:  Lumbar stenosis   I certify that, based on my findings, the following services are medically necessary home health services:  Physical therapy   My clinical findings support the need for the above services:  Pain interferes with ambulation/mobility   Further, I certify that my clinical findings support that this patient is homebound due to:  Pain interferes with ambulation/mobility   Reason for Medically Necessary Home Health Services:  Therapy- Investment banker, operational, Patent examiner   Home Health  As directed    Questions:     To provide the following care/treatments:  PT       Medication List         ALLEGRA 180 MG tablet  Generic drug:  fexofenadine  Take 180 mg by mouth daily.     buPROPion 100 MG tablet  Commonly known as:  WELLBUTRIN  Take 100 mg by mouth 2 (two) times daily.     cyclobenzaprine 10 MG tablet  Commonly known as:  FLEXERIL  Take 1 tablet (10 mg total) by mouth 3 (three) times daily as needed for muscle spasms.     fluticasone 50 MCG/ACT nasal spray  Commonly known as:  FLONASE  Place 2 sprays into the nose daily as needed for rhinitis.     hydrochlorothiazide 25 MG tablet  Commonly known as:  HYDRODIURIL  Take 25 mg by mouth daily.     NEXIUM 40 MG capsule  Generic drug:  esomeprazole  Take 40 mg by mouth 2 (two) times daily as needed. For heartburn     oxyCODONE  5 MG immediate release tablet  Commonly known as:  Oxy IR/ROXICODONE  Take 3 tablets (15 mg total) by mouth every 4 (four) hours as needed.     oxyCODONE-acetaminophen 7.5-325 MG per tablet  Commonly known as:  PERCOCET  Take 2 tablets by mouth every 6 (six) hours as needed for pain.     sildenafil 100 MG tablet  Commonly known as:  VIAGRA  Take 100 mg by mouth daily as needed for erectile dysfunction.     traZODone 100 MG tablet   Commonly known as:  DESYREL  Take 100 mg by mouth at bedtime.     vitamin B-12 1000 MCG tablet  Commonly known as:  CYANOCOBALAMIN  Take 1,000 mcg by mouth daily.           Follow-up Information   Follow up with [facility], [facility].   Specialty:  Neurosurgery   Contact information:   [address] [place] [address] [phone]       Signed: Lexandra Rettke P 03/21/2013, 9:30 AM

## 2013-03-21 NOTE — Progress Notes (Signed)
Occupational Therapy Treatment Patient Details Name: Cesar Knight MRN: [id number] DOB: 1956-05-23 Today's Date: 03/21/2013 Time: 1135-1200 OT Time Calculation (min): 25 min  OT Assessment / Plan / Recommendation  History of present illness 57 y.o male admitted for  L5-S1 posterior lumbar interbody fusion and redo posterior lateral fusion L4   OT comments  Pt and wife have been instructed in use and acquisition of AE for LB ADLs and safety with shower transfers.  Currently, pt requiring min guard assist to min A with BADLs.  Pt moving slowly today due to not feeling well.  Wife very supportive  Follow Up Recommendations  No OT follow up;Supervision - Intermittent    Barriers to Discharge       Equipment Recommendations  Other (comment) (RW)    Recommendations for Other Services    Frequency Min 2X/week   Progress towards OT Goals Progress towards OT goals: Progressing toward goals  Plan Discharge plan needs to be updated    Precautions / Restrictions Precautions Precautions: Back Precaution Booklet Issued: Yes (comment) Precaution Comments: Reviewed 3/3 back precautions. Required Braces or Orthoses: Spinal Brace Spinal Brace: Lumbar corset;Applied in sitting position Restrictions Weight Bearing Restrictions: No   Pertinent Vitals/Pain     ADL  Lower Body Dressing: Minimal assistance (with AE) Where Assessed - Lower Body Dressing: Supported sit to stand Toilet Transfer: Hydrographic surveyor Method: Sit to stand;Stand pivot Acupuncturist: Raised toilet seat with arms (or 3-in-1 over toilet) Toileting - Clothing Manipulation and Hygiene: Min guard Where Assessed - Engineer, mining and Hygiene: Standing Tub/Shower Transfer: Minimal assistance Tub/Shower Transfer Method: Science writer: Walk in shower Equipment Used: Back brace;Rolling walker Transfers/Ambulation Related to ADLs: supervision ADL Comments: Pt  instructed in use and acquisition of AE.  Pt. is currently unable to don socks by crossing ankles over knees.  He rquired min A for use of sock aid due to not feeling well this am.   Pt and wife instructed in safety with shower transfers    OT Diagnosis:    OT Problem List:   OT Treatment Interventions:     OT Goals(current goals can now be found in the care plan section) Acute Rehab OT Goals Patient Stated Goal: To get better ADL Goals Pt Will Perform Grooming: with modified independence;standing Pt Will Perform Lower Body Bathing: with modified independence;sit to/from stand;with adaptive equipment Pt Will Perform Lower Body Dressing: with modified independence;with adaptive equipment;sit to/from stand Pt Will Transfer to Toilet: with modified independence;ambulating;bedside commode Pt Will Perform Toileting - Clothing Manipulation and hygiene: with modified independence;sit to/from stand Pt Will Perform Tub/Shower Transfer: Shower transfer;with modified independence;ambulating;shower seat;rolling walker Additional ADL Goal #1: Pt will be independent with back precautions during all BADLs and functional mobility   Visit Information  Last OT Received On: 03/21/13 Assistance Needed: +1 History of Present Illness: 57 y.o male admitted for  L5-S1 posterior lumbar interbody fusion and redo posterior lateral fusion L4    Subjective Data      Prior Functioning       Cognition  Cognition Arousal/Alertness: Awake/alert Behavior During Therapy: WFL for tasks assessed/performed Overall Cognitive Status: Within Functional Limits for tasks assessed    Mobility  Bed Mobility Bed Mobility: Sit to Sidelying Right Rolling Right:  ( ) Rolling Left: 5: Supervision;Other (comment) (no rail) Left Sidelying to Sit: 5: Supervision;HOB flat Sitting - Scoot to Edge of Bed: 7: Independent Sit to Sidelying Left: 6: Modified independent (Device/Increase time) Details  for Bed Mobility Assistance:  vc's for better technique without use of the rail Transfers Transfers: Sit to Stand;Stand to Sit Sit to Stand: 4: Min guard;With upper extremity assist;From chair/3-in-1 Stand to Sit: 4: Min guard;With upper extremity assist;To bed;To chair/3-in-1 Details for Transfer Assistance: min guard for precautions and safety     Exercises      Balance     End of Session OT - End of Session Equipment Utilized During Treatment: Rolling walker;Back brace Activity Tolerance: Patient limited by lethargy Patient left: in bed;with call bell/phone within reach;with family/visitor present  GO     Cesar Knight, Ursula Alert M 03/21/2013, 12:20 PM

## 2013-03-21 NOTE — Progress Notes (Signed)
Patient ID: Cesar Knight, male   DOB: Dec 02, 1955, 57 y.o.   MRN: [id number] Patient is doing well plan discharge

## 2013-03-21 NOTE — Care Management Note (Signed)
    Page 1 of 1   03/21/2013     11:15:14 AM   CARE MANAGEMENT NOTE 03/21/2013  Patient:  DIXON, LUCZAK   Account Number:  [id number]  Date Initiated:  03/21/2013  Documentation initiated by:  Elmer Bales  Subjective/Objective Assessment:   Patient admitted for PLIF. Lives at home with wife     Action/Plan:   will follow for discharge needs.   Anticipated DC Date:  03/21/2013   Anticipated DC Plan:  HOME W HOME HEALTH SERVICES      DC Planning Services  CM consult      Choice offered to / List presented to:  C-1 Patient        HH arranged  HH-2 PT      [facility] agency  [facility]   Status of service:  Completed, signed off Medicare Important Message given?   (If response is "NO", the following Medicare IM given date fields will be blank) Date Medicare IM given:   Date Additional Medicare IM given:    Discharge Disposition:  HOME W HOME HEALTH SERVICES  Per UR Regulation:  Reviewed for med. necessity/level of care/duration of stay  If discussed at Long Length of Stay Meetings, dates discussed:    Comments:  03/21/13 1100 Elmer Bales RN, MSN, CM- patient has chosen [facility]. Mary with [facility] was notified and accepted the referral.   03/21/13 1000 Elmer Bales RN, MSN, CM- Met with patient to discuss home health PT.  Pt and wife were presented with the list for [facility]. The plan to look over the list and let CM know when they've made their choice. Will continue to follow. Pt's RN aware.

## 2013-03-21 NOTE — Plan of Care (Signed)
Problem: Consults Goal: Diagnosis - Spinal Surgery Outcome: Completed/Met Date Met:  03/21/13 [place]/Lumbar Spine Fusion

## 2013-03-21 NOTE — Progress Notes (Addendum)
Physical Therapy Treatment Patient Details Name: Cesar Knight MRN: [id number] DOB: 1955/10/18 Today's Date: 03/21/2013 Time: 4098-1191 PT Time Calculation (min): 27 min  PT Assessment / Plan / Recommendation  History of Present Illness 57 y.o male admitted for  L5-S1 posterior lumbar interbody fusion and redo posterior lateral fusion L4   PT Comments     Follow Up Recommendations  Home health PT;Supervision for mobility/OOB     Does the patient have the potential to tolerate intense rehabilitation     Barriers to Discharge        Equipment Recommendations  Rolling walker 5" fixed wheels   Recommendations for Other Services    Frequency Min 5X/week   Progress towards PT Goals Progress towards PT goals: Progressing toward goals  Plan Current plan remains appropriate    Precautions / Restrictions Precautions Precautions: Back Precaution Booklet Issued: Yes (comment) Precaution Comments: Reviewed 3/3 back precautions. Required Braces or Orthoses: Spinal Brace Spinal Brace: Lumbar corset;Applied in sitting position Restrictions Weight Bearing Restrictions: No   Pertinent Vitals/Pain Pt/wife had lots of questions about general progression of activity which were answered to their satisfaction.  Pt still having a mildly difficult time standing from lower surfaces, but will be safe with wife's assist.    Mobility  Bed Mobility Bed Mobility: Rolling Left;Left Sidelying to Sit;Sitting - Scoot to Edge of Bed Rolling Right:  ( ) Rolling Left: 5: Supervision;Other (comment) (no rail) Left Sidelying to Sit: 5: Supervision;HOB flat Sitting - Scoot to Edge of Bed: 7: Independent Details for Bed Mobility Assistance: vc's for better technique without use of the rail Transfers Transfers: Sit to Stand;Stand to Sit Sit to Stand: 4: Min guard;With upper extremity assist;From bed;From chair/3-in-1 Stand to Sit: 4: Min guard;With upper extremity assist;To chair/3-in-1;To bed Details for  Transfer Assistance: reinforcing better ways to stand up from lower surfaces Ambulation/Gait Ambulation/Gait Assistance: 5: Supervision Ambulation Distance (Feet): 200 Feet Assistive device: Rolling walker Ambulation/Gait Assistance Details: steady and safe Gait Pattern: Within Functional Limits Gait velocity: still slower, but more variable in speed Stairs: Yes Stairs Assistance: 4: Min guard;4: Min assist Stair Management Technique: One rail Right;Alternating pattern;Forwards Number of Stairs: 7 Wheelchair Mobility Wheelchair Mobility: No    Exercises     PT Diagnosis:    PT Problem List:   PT Treatment Interventions:     PT Goals (current goals can now be found in the care plan section) Acute Rehab PT Goals Patient Stated Goal: To get better PT Goal Formulation: With patient Time For Goal Achievement: 03/26/13 Potential to Achieve Goals: Good  Visit Information  Last PT Received On: 03/21/13 Assistance Needed: +1 History of Present Illness: 56 y.o male admitted for  L5-S1 posterior lumbar interbody fusion and redo posterior lateral fusion L4    Subjective Data  Subjective: Dr. Wynetta Emery said he wanted him to stay in the house until our followup Patient Stated Goal: To get better   Cognition  Cognition Arousal/Alertness: Awake/alert Behavior During Therapy: [facility] for tasks assessed/performed Overall Cognitive Status: Within Functional Limits for tasks assessed    Balance     End of Session PT - End of Session Equipment Utilized During Treatment: Back brace Activity Tolerance: Patient tolerated treatment well Patient left: in chair;with family/visitor present   GP     Saylah Ketner, [name] 03/21/2013, 11:46 AM 03/21/2013  [place], PT [phone]  (pager)

## 2013-03-21 NOTE — Progress Notes (Signed)
Patient temperature was elevated. RN followed protocol, medication was given and MD aware of patient situation.Order to discharged still in effect. RN will continue to monitor patient. Marin Roberts RN

## 2013-03-22 LAB — TYPE AND SCREEN
ABO/RH(D): O POS
Antibody Screen: POSITIVE
DAT, IgG: NEGATIVE
Donor AG Type: NEGATIVE
Donor AG Type: NEGATIVE
PT AG Type: NEGATIVE
Unit division: 0
Unit division: 0

## 2013-03-22 MED FILL — Heparin Sodium (Porcine) Inj 1000 Unit/ML: INTRAMUSCULAR | Qty: 30 | Status: AC

## 2013-03-22 MED FILL — Sodium Chloride Irrigation Soln 0.9%: Qty: 3000 | Status: AC

## 2013-03-22 MED FILL — Sodium Chloride IV Soln 0.9%: INTRAVENOUS | Qty: 1000 | Status: AC

## 2013-03-22 NOTE — Progress Notes (Signed)
Discharged instructions given. Patient's wife verbalized understand and all questions were answered.

## 2013-03-22 NOTE — Progress Notes (Signed)
Patient ID: Cesar Knight, male   DOB: 12-Oct-1955, 57 y.o.   MRN: [id number] Fever curve down discharge home
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
## 2013-03-24 ENCOUNTER — Emergency Department (HOSPITAL_COMMUNITY): Payer: BC Managed Care – PPO

## 2013-03-24 ENCOUNTER — Emergency Department (HOSPITAL_COMMUNITY)
Admission: EM | Admit: 2013-03-24 | Discharge: 2013-03-24 | Disposition: A | Discharge status: Home / Self Care | Payer: BC Managed Care – PPO | Attending: Emergency Medicine | Admitting: Emergency Medicine

## 2013-03-24 ENCOUNTER — Encounter (HOSPITAL_COMMUNITY): Payer: Self-pay | Admitting: *Deleted

## 2013-03-24 DIAGNOSIS — M1990 Unspecified osteoarthritis, unspecified site: Secondary | ICD-10-CM | POA: Insufficient documentation

## 2013-03-24 DIAGNOSIS — R10819 Abdominal tenderness, unspecified site: Secondary | ICD-10-CM | POA: Insufficient documentation

## 2013-03-24 DIAGNOSIS — M545 Low back pain, unspecified: Secondary | ICD-10-CM | POA: Insufficient documentation

## 2013-03-24 DIAGNOSIS — Z87768 Personal history of other specified (corrected) congenital malformations of integument, limbs and musculoskeletal system: Secondary | ICD-10-CM | POA: Insufficient documentation

## 2013-03-24 DIAGNOSIS — G8918 Other acute postprocedural pain: Secondary | ICD-10-CM | POA: Insufficient documentation

## 2013-03-24 DIAGNOSIS — N529 Male erectile dysfunction, unspecified: Secondary | ICD-10-CM | POA: Insufficient documentation

## 2013-03-24 DIAGNOSIS — Z8679 Personal history of other diseases of the circulatory system: Secondary | ICD-10-CM | POA: Insufficient documentation

## 2013-03-24 DIAGNOSIS — Z8719 Personal history of other diseases of the digestive system: Secondary | ICD-10-CM | POA: Insufficient documentation

## 2013-03-24 DIAGNOSIS — F329 Major depressive disorder, single episode, unspecified: Secondary | ICD-10-CM | POA: Insufficient documentation

## 2013-03-24 DIAGNOSIS — Z79899 Other long term (current) drug therapy: Secondary | ICD-10-CM | POA: Insufficient documentation

## 2013-03-24 DIAGNOSIS — I10 Essential (primary) hypertension: Secondary | ICD-10-CM | POA: Insufficient documentation

## 2013-03-24 DIAGNOSIS — Z8659 Personal history of other mental and behavioral disorders: Secondary | ICD-10-CM | POA: Insufficient documentation

## 2013-03-24 DIAGNOSIS — IMO0002 Reserved for concepts with insufficient information to code with codable children: Secondary | ICD-10-CM | POA: Insufficient documentation

## 2013-03-24 DIAGNOSIS — F3289 Other specified depressive episodes: Secondary | ICD-10-CM | POA: Insufficient documentation

## 2013-03-24 DIAGNOSIS — K219 Gastro-esophageal reflux disease without esophagitis: Secondary | ICD-10-CM | POA: Insufficient documentation

## 2013-03-24 DIAGNOSIS — G4700 Insomnia, unspecified: Secondary | ICD-10-CM | POA: Insufficient documentation

## 2013-03-24 DIAGNOSIS — R509 Fever, unspecified: Secondary | ICD-10-CM | POA: Insufficient documentation

## 2013-03-24 DIAGNOSIS — Z87891 Personal history of nicotine dependence: Secondary | ICD-10-CM | POA: Insufficient documentation

## 2013-03-24 DIAGNOSIS — Z792 Long term (current) use of antibiotics: Secondary | ICD-10-CM | POA: Insufficient documentation

## 2013-03-24 DIAGNOSIS — Z8776 Personal history of (corrected) congenital malformations of integument, limbs and musculoskeletal system: Secondary | ICD-10-CM | POA: Insufficient documentation

## 2013-03-24 DIAGNOSIS — K5900 Constipation, unspecified: Secondary | ICD-10-CM | POA: Insufficient documentation

## 2013-03-24 LAB — URINALYSIS, ROUTINE W REFLEX MICROSCOPIC
Bilirubin Urine: NEGATIVE
Ketones, ur: NEGATIVE mg/dL
Leukocytes, UA: NEGATIVE
Nitrite: NEGATIVE
Protein, ur: NEGATIVE mg/dL
Urobilinogen, UA: 1 mg/dL (ref 0.0–1.0)
pH: 6 (ref 5.0–8.0)

## 2013-03-24 LAB — CBC WITH DIFFERENTIAL/PLATELET
Basophils Relative: 1 % (ref 0–1)
Eosinophils Absolute: 0.2 10*3/uL (ref 0.0–0.7)
Hemoglobin: 11.1 g/dL — ABNORMAL LOW (ref 13.0–17.0)
MCHC: 33.7 g/dL (ref 30.0–36.0)
Monocytes Relative: 12 % (ref 3–12)
Neutro Abs: 3.3 10*3/uL (ref 1.7–7.7)
Neutrophils Relative %: 51 % (ref 43–77)
Platelets: 291 10*3/uL (ref 150–400)
RBC: 3.68 MIL/uL — ABNORMAL LOW (ref 4.22–5.81)

## 2013-03-24 LAB — SEDIMENTATION RATE: Sed Rate: 125 mm/hr — ABNORMAL HIGH (ref 0–16)

## 2013-03-24 LAB — C-REACTIVE PROTEIN: CRP: 21.3 mg/dL — ABNORMAL HIGH (ref <0.60)

## 2013-03-24 MED ORDER — DIPHENHYDRAMINE HCL 50 MG/ML IJ SOLN
12.5000 mg | Freq: Once | INTRAMUSCULAR | Status: AC
  Administered 2013-03-24: 12.5 mg via INTRAVENOUS
  Filled 2013-03-24: qty 1

## 2013-03-24 MED ORDER — HYDROMORPHONE HCL PF 1 MG/ML IJ SOLN
1.0000 mg | Freq: Once | INTRAMUSCULAR | Status: AC
Start: 2013-03-24 — End: 2013-03-24
  Administered 2013-03-24: 1 mg via INTRAVENOUS
  Filled 2013-03-24: qty 1

## 2013-03-24 MED ORDER — AZITHROMYCIN 250 MG PO TABS: 250.0000 mg | ORAL_TABLET | Freq: Every day | ORAL | Status: DC

## 2013-03-24 MED ORDER — ONDANSETRON HCL 4 MG/2ML IJ SOLN
4.0000 mg | Freq: Once | INTRAMUSCULAR | Status: AC
  Administered 2013-03-24: 4 mg via INTRAVENOUS
  Filled 2013-03-24: qty 2

## 2013-03-24 MED ORDER — POLYETHYLENE GLYCOL 3350 17 GM/SCOOP PO POWD: 17.0000 g | Freq: Two times a day (BID) | ORAL | Status: DC

## 2013-03-24 MED ORDER — FLEET ENEMA 7-19 GM/118ML RE ENEM
1.0000 | ENEMA | Freq: Once | RECTAL | Status: AC
  Administered 2013-03-24: 1 via RECTAL
  Filled 2013-03-24: qty 1

## 2013-03-24 MED ORDER — DEXAMETHASONE SODIUM PHOSPHATE 10 MG/ML IJ SOLN
10.0000 mg | Freq: Once | INTRAMUSCULAR | Status: AC
  Administered 2013-03-24: 10 mg via INTRAVENOUS
  Filled 2013-03-24: qty 1

## 2013-03-24 NOTE — ED Notes (Addendum)
Dr Marlyn Corporal called to notify of pt arrival, he will be here to see pt in 45 minutes

## 2013-03-24 NOTE — Consult Note (Signed)
Reason for Consult: Fever recent back surgery Referring Physician: Emergency department  Cesar Knight is an 57 y.o. male.  HPI: Patient is a very pleasant 7 year old son who is 6 days out from a lumbar fusion from L4 down to S1 he was discharged hospital on Tuesday was doing very well however at home uses back and intermittent temperatures and today developed confusion as well as worsening back pain he's been taking oxycodone Flexeril from from home and also he is placed on a 4 quinolone to manage potential bronchitis and pneumonia. Patient denies any son numbness tingling her pain is legs and denies any difficulty with bladder assessment constipation that's what relatively asked. He is passing gas. A photophobia denies any meningismus. Denies any drainage from his incision  Past Medical History  Diagnosis Date  . Depression   . GERD (gastroesophageal reflux disease)     per Dr. Russella Dar  . Hypertension   . Allergy   . Headache(784.0)   . Insomnia   . Subdural hematoma 1997  . Erectile dysfunction   . Hemorrhoids   . Low back pain   . Osteoarthritis   . Post traumatic stress disorder (PTSD)     from emotional trauma during serivce in the [employer]  . Complication of anesthesia     difficulty urinating   . Family history of anesthesia complication     Mother reports that it takes a while for her to wake up  . Diverticulitis   . Bipartite patella     Past Surgical History  Procedure Laterality Date  . Burr hole for subdural hematoma  1997  . Lumbar laminectomy  2000  . Vasectomy    . Knee arthroscopy  2005    right knee, repeat right knee arthoscopy 01/29/10 per Dr. Dorene Grebe  . Ulnar nerve transposition    . Cataract extraction Right 1985    Family History  Problem Relation Age of Onset  . Alcohol abuse    . Cancer      colon first degree relative  . Diabetes      first degree relative  . Hyperlipidemia      family history  . Hypertension      family history  . Mental  illness      family history  . Sudden death      family history  . Heart disease      family history    Social History:  reports that he quit smoking about 10 years ago. His smoking use included Cigarettes. He has a 15 pack-year smoking history. He quit smokeless tobacco use about 10 years ago. He reports that he drinks about 2.0 ounces of alcohol per week. He reports that he does not use illicit drugs.  Allergies:  Allergies  Allergen Reactions  . Avelox [Moxifloxacin Hcl In Nacl] Anaphylaxis  . Cefdinir     REACTION: itching, swelling  . Prochlorperazine Edisylate     REACTION: "Flighty" compazine    Medications: I have reviewed the patient's current medications.  Results for orders placed during the hospital encounter of 03/24/13 (from the past 48 hour(s))  CBC WITH DIFFERENTIAL     Status: Abnormal   Collection Time    03/24/13  5:06 PM      Result Value Range   WBC 6.4  4.0 - 10.5 K/uL   RBC 3.68 (*) 4.22 - 5.81 MIL/uL   Hemoglobin 11.1 (*) 13.0 - 17.0 g/dL   HCT 96.0 (*) 45.4 - 09.8 %  MCV 89.4  78.0 - 100.0 fL   MCH 30.2  26.0 - 34.0 pg   MCHC 33.7  30.0 - 36.0 g/dL   RDW 16.1  09.6 - 04.5 %   Platelets 291  150 - 400 K/uL   Neutrophils Relative % 51  43 - 77 %   Neutro Abs 3.3  1.7 - 7.7 K/uL   Lymphocytes Relative 34  12 - 46 %   Lymphs Abs 2.2  0.7 - 4.0 K/uL   Monocytes Relative 12  3 - 12 %   Monocytes Absolute 0.8  0.1 - 1.0 K/uL   Eosinophils Relative 3  0 - 5 %   Eosinophils Absolute 0.2  0.0 - 0.7 K/uL   Basophils Relative 1  0 - 1 %   Basophils Absolute 0.0  0.0 - 0.1 K/uL    Dg Chest 2 View  03/24/2013   *RADIOLOGY REPORT*  Clinical Data: Back pain.  CHEST - 2 VIEW  Comparison: 03/10/2013  Findings: The heart, mediastinum and hila are within normal limits.  The lungs are clear.  No pleural effusion or pneumothorax is seen.  There are old, stable healed right rib fractures.  The bony thorax is otherwise intact.  IMPRESSION: No active disease of the  chest.  No change from the prior study.   Original Report Authenticated By: Amie Portland, M.D.    Review of Systems  Constitutional: Positive for fever and malaise/fatigue.  Eyes: Negative for photophobia.  Respiratory: Negative for cough, sputum production and shortness of breath.   Cardiovascular: Negative for chest pain, claudication and leg swelling.  Gastrointestinal: Negative for nausea and vomiting.  Musculoskeletal: Positive for myalgias and back pain.  Neurological: Positive for headaches. Negative for tingling, tremors, sensory change, speech change and focal weakness.   Blood pressure 164/82, pulse 96, temperature 98.9 F (37.2 C), temperature source Oral, resp. rate 18, SpO2 96.00%. Physical Exam  Constitutional: He is oriented to person, place, and time.  Neck: Normal range of motion.  Cardiovascular: Normal rate.   Respiratory: Effort normal and breath sounds normal.  GI: Soft.  Neurological: He is alert and oriented to person, place, and time. He has normal strength. GCS eye subscore is 4. GCS verbal subscore is 5. GCS motor subscore is 6.  Reflex Scores:      Tricep reflexes are 2+ on the right side and 2+ on the left side.      Bicep reflexes are 2+ on the right side and 2+ on the left side.      Brachioradialis reflexes are 2+ on the right side and 2+ on the left side.      Patellar reflexes are 2+ on the right side and 2+ on the left side.      Achilles reflexes are 2+ on the right side and 2+ on the left side. Strength is 5 out of 5 in his iliopsoas, quads, hip she's, gastrocs, into tibialis, EHL.    Assessment/Plan: Patient is 6 days status post lumbar fusion L4-S1 with fever of unknown origin possible etiologies include bronchitis pneumonia constipation unlikely DVT UTI oral infection. On examination no clinical evidence of DVT clinical evidence of pneumonia bronchitis I do her chest x-ray chest x-ray came back negative his initial blood work shows normal white  count her main is a Financial controller still pending the I discussed this with the emergency department physician and they'll be evaluated patient is well we'll continue occupational for a fever of unknown origin we'll get the  nurse to try to help patient in enema move his bowels this all could be constipation as a source of fever or worsening back pain. An Alcon to the pharmacy and insure the patient was not placed on a training name correction generic name for Avelox as patient has had hallucinations and confusion as a result of it before. At the ER physician finds anything sensitivity admission she will contact me we will place patient in house for observation.  Ferne Ellingwood P 03/24/2013, 6:17 PM

## 2013-03-24 NOTE — ED Provider Notes (Signed)
CSN: [id number]     Arrival date & time 03/24/13  1658 History   First MD Initiated Contact with Patient 03/24/13 1750     Chief Complaint  Patient presents with  . Back Pain  . Post-op Problem   (Consider location/radiation/quality/duration/timing/severity/associated sxs/prior Treatment) HPI Comments: Patient is a 57 year old male who is 6 days post lumbar spine fusion who presents with intermittent fever since the surgery. Dr. Wynetta Emery performed the surgery. Symptoms started gradually and remained intermittent. Patient reports a fever as high as 101F at home. Patient reports associated constipation and back pain. Patient has been treating the fever with OTC medications with some relief. Patient became concerned a few days ago about the fever and called Dr. Wynetta Emery who prescribed Avelox for possible post operative atelectasis/bronchitis. Patient reports the Avelox made him feel confused and hallucinate. Patient reports relief of confusion and hallucination with stopping the Avelox. Patient came to the ED for further evaluation of fever. No aggravating/alleviating factors.    Past Medical History  Diagnosis Date  . Depression   . GERD (gastroesophageal reflux disease)     per Dr. Russella Dar  . Hypertension   . Allergy   . Headache(784.0)   . Insomnia   . Subdural hematoma 1997  . Erectile dysfunction   . Hemorrhoids   . Low back pain   . Osteoarthritis   . Post traumatic stress disorder (PTSD)     from emotional trauma during serivce in the [employer]  . Complication of anesthesia     difficulty urinating   . Family history of anesthesia complication     Mother reports that it takes a while for her to wake up  . Diverticulitis   . Bipartite patella    Past Surgical History  Procedure Laterality Date  . Burr hole for subdural hematoma  1997  . Lumbar laminectomy  2000  . Vasectomy    . Knee arthroscopy  2005    right knee, repeat right knee arthoscopy 01/29/10 per Dr. Dorene Grebe  . Ulnar nerve  transposition    . Cataract extraction Right 1985   Family History  Problem Relation Age of Onset  . Alcohol abuse    . Cancer      colon first degree relative  . Diabetes      first degree relative  . Hyperlipidemia      family history  . Hypertension      family history  . Mental illness      family history  . Sudden death      family history  . Heart disease      family history   History  Substance Use Topics  . Smoking status: Former Smoker -- 0.50 packs/day for 30 years    Types: Cigarettes    Quit date: 09/27/2002  . Smokeless tobacco: Former Neurosurgeon    Quit date: 09/27/2002  . Alcohol Use: 2.0 oz/week    4 drink(s) per week     Comment: Socially, reports has not had alcohol for a while    Review of Systems  Constitutional: Positive for fever.  Musculoskeletal: Positive for back pain.  All other systems reviewed and are negative.    Allergies  Avelox; Cefdinir; and Prochlorperazine edisylate  Home Medications   Current Outpatient Rx  Name  Route  Sig  Dispense  Refill  . buPROPion (WELLBUTRIN) 100 MG tablet   Oral   Take 100 mg by mouth 2 (two) times daily.         Marland Kitchen  cyclobenzaprine (FLEXERIL) 10 MG tablet   Oral   Take 1 tablet (10 mg total) by mouth 3 (three) times daily as needed for muscle spasms.   80 tablet   1   . esomeprazole (NEXIUM) 40 MG capsule   Oral   Take 40 mg by mouth 2 (two) times daily as needed. For heartburn         . fexofenadine (ALLEGRA) 180 MG tablet   Oral   Take 180 mg by mouth daily.           . fluticasone (FLONASE) 50 MCG/ACT nasal spray   Nasal   Place 2 sprays into the nose daily as needed for rhinitis.          . hydrochlorothiazide 25 MG tablet   Oral   Take 25 mg by mouth daily.          Marland Kitchen moxifloxacin (AVELOX) 400 MG tablet   Oral   Take 400 mg by mouth daily.         Marland Kitchen oxyCODONE (OXY IR/ROXICODONE) 5 MG immediate release tablet   Oral   Take 3 tablets (15 mg total) by mouth every 4  (four) hours as needed.   80 tablet   0   . sildenafil (VIAGRA) 100 MG tablet   Oral   Take 100 mg by mouth daily as needed for erectile dysfunction.         . traZODone (DESYREL) 100 MG tablet   Oral   Take 100 mg by mouth at bedtime.         . vitamin B-12 (CYANOCOBALAMIN) 1000 MCG tablet   Oral   Take 1,000 mcg by mouth daily.         Marland Kitchen oxyCODONE-acetaminophen (PERCOCET) 7.5-325 MG per tablet   Oral   Take 2 tablets by mouth every 6 (six) hours as needed for pain.          BP 164/82  Pulse 96  Temp(Src) 98.9 F (37.2 C) (Oral)  Resp 18  SpO2 96% Physical Exam  Nursing note and vitals reviewed. Constitutional: He is oriented to person, place, and time. He appears well-developed and well-nourished. No distress.  HENT:  Head: Normocephalic and atraumatic.  Eyes: Conjunctivae are normal.  Neck: Normal range of motion.  Cardiovascular: Normal rate and regular rhythm.  Exam reveals no gallop and no friction rub.   No murmur heard. Pulmonary/Chest: Effort normal and breath sounds normal. He has no wheezes. He has no rales. He exhibits no tenderness.  Abdominal: Soft. He exhibits no distension. There is no tenderness. There is no rebound and no guarding.  Mild suprapubic tenderness to palpation. No peritoneal signs or focal tenderness to palpation.   Musculoskeletal: Normal range of motion.  No midline spine tenderness to palpation. Lumbar spine surgical site healing well without signs of drainage or wound dehiscence.   Neurological: He is alert and oriented to person, place, and time. Coordination normal.  Speech is goal-oriented. Moves limbs without ataxia.   Skin: Skin is warm and dry.  Psychiatric: He has a normal mood and affect. His behavior is normal.    ED Course  Procedures (including critical care time) Labs Review Labs Reviewed  CBC WITH DIFFERENTIAL - Abnormal; Notable for the following:    RBC 3.68 (*)    Hemoglobin 11.1 (*)    HCT 32.9 (*)     All other components within normal limits  SEDIMENTATION RATE - Abnormal; Notable for the following:    Sed  Rate 125 (*)    All other components within normal limits  C-REACTIVE PROTEIN - Abnormal; Notable for the following:    CRP 21.3 (*)    All other components within normal limits  URINALYSIS, ROUTINE W REFLEX MICROSCOPIC - Abnormal; Notable for the following:    Color, Urine AMBER (*)    All other components within normal limits  URINE CULTURE   Imaging Review Dg Chest 2 View  03/24/2013   *RADIOLOGY REPORT*  Clinical Data: Back pain.  CHEST - 2 VIEW  Comparison: 03/10/2013  Findings: The heart, mediastinum and hila are within normal limits.  The lungs are clear.  No pleural effusion or pneumothorax is seen.  There are old, stable healed right rib fractures.  The bony thorax is otherwise intact.  IMPRESSION: No active disease of the chest.  No change from the prior study.   Original Report Authenticated By: Amie Portland, M.D.    MDM   1. Fever   2. Constipation     6:24 PM Labs, urinalysis and chest xray pending. Vitals stable and patient afebrile.   Patient's labs, urinalysis and chest xray unremarkable for acute changes. Patient had a bowel movement here after fleet enema. Patient reports some relief. I will give the patient IV pain medication and discharge him with Azithromycin for possible bacterial infection. Patient instructed to follow up with Dr. Wynetta Emery and return to the ED with worsening or concerning symptoms.     Emilia Beck, PA-C 03/25/13 1119

## 2013-03-24 NOTE — ED Notes (Signed)
Pt reports having lumbar fusion last Friday. Having increase in back pain, having a fever and hallucinations (thinks that's from the meds).

## 2013-03-26 LAB — URINE CULTURE: Culture: NO GROWTH

## 2013-03-26 NOTE — ED Provider Notes (Signed)
Medical screening examination/treatment/procedure(s) were performed by non-physician practitioner and as supervising physician I was immediately available for consultation/collaboration.  Kristen N Ward, DO 03/26/13 1503 

## 2013-04-27 ENCOUNTER — Other Ambulatory Visit: Payer: Self-pay | Admitting: Neurosurgery

## 2013-04-27 DIAGNOSIS — M5126 Other intervertebral disc displacement, lumbar region: Secondary | ICD-10-CM

## 2013-05-09 ENCOUNTER — Ambulatory Visit
Admission: RE | Admit: 2013-05-09 | Discharge: 2013-05-09 | Disposition: A | Discharge status: Home / Self Care | Payer: BC Managed Care – PPO | Source: Ambulatory Visit | Attending: Neurosurgery | Admitting: Neurosurgery

## 2013-05-09 DIAGNOSIS — M5126 Other intervertebral disc displacement, lumbar region: Secondary | ICD-10-CM

## 2013-05-09 MED ORDER — GADOBENATE DIMEGLUMINE 529 MG/ML IV SOLN
20.0000 mL | Freq: Once | INTRAVENOUS | Status: AC | PRN
  Administered 2013-05-09: 20 mL via INTRAVENOUS

## 2013-06-02 ENCOUNTER — Other Ambulatory Visit: Payer: Self-pay | Admitting: Sports Medicine

## 2013-06-02 ENCOUNTER — Other Ambulatory Visit: Payer: Self-pay

## 2013-06-02 DIAGNOSIS — M5412 Radiculopathy, cervical region: Secondary | ICD-10-CM

## 2013-06-02 DIAGNOSIS — M542 Cervicalgia: Secondary | ICD-10-CM

## 2013-06-13 ENCOUNTER — Ambulatory Visit
Admission: RE | Admit: 2013-06-13 | Discharge: 2013-06-13 | Disposition: A | Discharge status: Home / Self Care | Payer: BC Managed Care – PPO | Source: Ambulatory Visit | Attending: Sports Medicine | Admitting: Sports Medicine

## 2013-06-13 DIAGNOSIS — M542 Cervicalgia: Secondary | ICD-10-CM

## 2013-06-13 DIAGNOSIS — M5412 Radiculopathy, cervical region: Secondary | ICD-10-CM

## 2013-06-21 ENCOUNTER — Other Ambulatory Visit: Payer: Self-pay | Admitting: Neurosurgery

## 2013-07-02 ENCOUNTER — Emergency Department (INDEPENDENT_AMBULATORY_CARE_PROVIDER_SITE_OTHER)
Admission: EM | Admit: 2013-07-02 | Discharge: 2013-07-02 | Disposition: A | Discharge status: Home / Self Care | Payer: BC Managed Care – PPO | Source: Home / Self Care | Attending: Emergency Medicine | Admitting: Emergency Medicine

## 2013-07-02 ENCOUNTER — Encounter (HOSPITAL_COMMUNITY): Payer: Self-pay | Admitting: Emergency Medicine

## 2013-07-02 DIAGNOSIS — K612 Anorectal abscess: Secondary | ICD-10-CM

## 2013-07-02 DIAGNOSIS — K648 Other hemorrhoids: Secondary | ICD-10-CM

## 2013-07-02 DIAGNOSIS — K611 Rectal abscess: Secondary | ICD-10-CM

## 2013-07-02 MED ORDER — HYDROCORTISONE ACETATE 25 MG RE SUPP: 25.0000 mg | Freq: Two times a day (BID) | RECTAL | Status: DC

## 2013-07-02 MED ORDER — HYDROCODONE-ACETAMINOPHEN 5-325 MG PO TABS
ORAL_TABLET | ORAL | Status: AC
  Filled 2013-07-02: qty 2

## 2013-07-02 MED ORDER — HYDROCODONE-ACETAMINOPHEN 5-325 MG PO TABS
2.0000 | ORAL_TABLET | Freq: Once | ORAL | Status: AC
  Administered 2013-07-02: 2 via ORAL

## 2013-07-02 MED ORDER — AMOXICILLIN-POT CLAVULANATE 875-125 MG PO TABS: 1.0000 | ORAL_TABLET | Freq: Two times a day (BID) | ORAL | Status: DC

## 2013-07-02 NOTE — ED Provider Notes (Signed)
Chief Complaint:   Chief Complaint  Patient presents with  . Hemorrhoids    History of Present Illness:    Cesar Knight is a 57 year old male who's had a four-day history of anal pain. He denies any bleeding. He has been constipated. He notes pain with urination, but the pain is referred to the anus. He has no decrease in his stream or blood in the urine. He has a history of internal hemorrhoids in the past. He had a colonoscopy about 4 years ago. He denies any history of perianal abscesses. He has had no fever or chills.  Review of Systems:  Other than noted above, the patient denies any of the following symptoms: Constitutional:  No fever, chills, fatigue, weight loss or anorexia. Lungs:  No cough or shortness of breath. Heart:  No chest pain, palpitations, syncope or edema.  No cardiac history. Abdomen:  No nausea, vomiting, hematememesis, melena, constipation, or diarrhea. GU:  No dysuria, frequency, urgency, or hematuria.   Skin:  No rash or itching.  PMFSH:  Past medical history, family history, social history, meds, and allergies were reviewed along with nurse's notes.  No prior abdominal surgeries or history of GI problems.  No use of NSAIDs or aspirin.  No excessive  alcohol intake.   Physical Exam:   Vital signs:  BP 169/88  Pulse 97  Temp(Src) 98.4 F (36.9 C) (Oral)  Resp 18  SpO2 100% Gen:  Alert, oriented, in no distress. Lungs:  Breath sounds clear and equal bilaterally.  No wheezes, rales or rhonchi. Heart:  Regular rhythm.  No gallops or murmers.   Abdomen:  Soft, nontender, no organomegaly or mass, bowel sounds are normal. Skin:  Clear, warm and dry.  No rash.  Procedure Note:  Verbal informed consent was obtained from the patient.  Risks and benefits were outlined with the patient.  Patient understands and accepts these risks.  Identity of the patient was confirmed verbally and by armband.    Procedure was performed as followed:  External exam of the anus was  unremarkable with no external hemorrhoids. There was tenderness to palpation just to the right of the anus. There was no abscess, but there was a little bit of puffiness in the area. There was no fluctuance. Anoscopic exam was very uncomfortable to the patient produced severe pain, out of proportion to the findings on exam. He has a rosette of internal hemorrhoids which were not bleeding, and one large, pedunculated hemorrhoid arising from the right side, this was not bleeding or ulcerated. After the endoscopic exam was done, the patient stated he was in so much pain that he did not think he tolerated digital exam, so he declined to have this done.  Patient tolerated the procedure well without any immediate complications except for discomfort and pain.  Course in Urgent Care Center:   He was given Norco 5/325 2 for pain. His wife will be driving him home.   Assessment:  The primary encounter diagnosis was Internal hemorrhoids. A diagnosis of Peri-rectal abscess was also pertinent to this visit.  His degree of pain is out of proportion to findings on anoscopic examination which leads me to think he may have a deep perianal abscess. This was not sufficiently close to the surface to allow me to do an incision and drainage. I have asked him to use daily sitz baths, stool softeners, and to followup with his gastroenterologist early next week. In the meantime we'll cover with Augmentin and Anusol-HC suppositories.  She has some Percocet at home and he can take.  Plan:   1.  Meds:  The following meds were prescribed:   Discharge Medication List as of 07/02/2013 12:47 PM    START taking these medications   Details  amoxicillin-clavulanate (AUGMENTIN) 875-125 MG per tablet Take 1 tablet by mouth 2 (two) times daily., Starting 07/02/2013, Until Discontinued, Normal    hydrocortisone (ANUSOL-HC) 25 MG suppository Place 1 suppository (25 mg total) rectally 2 (two) times daily., Starting 07/02/2013, Until  Discontinued, Normal        2.  Patient Education/Counseling:  The patient was given appropriate handouts, self care instructions, and instructed in symptomatic relief.  He was informed about his elevated blood pressure and asked to have this checked as well.  3.  Follow up:  The patient was told to follow up if no better in 3 to 4 days, if becoming worse in any way, and given some red flag symptoms such as any evidence of GI bleeding or fever which would prompt immediate return.  Follow up with his gastroenterologist next week.      Reuben Likes, MD 07/02/13 [phone]

## 2013-07-02 NOTE — ED Notes (Signed)
C/o constipation and rectal  rectal pain for 4 days.  States has hx of "high" hemorrhoids.  Denies rectal bleeding.  Last bm was 2 days ago- reports it was hard.

## 2013-07-05 ENCOUNTER — Encounter (HOSPITAL_COMMUNITY): Payer: Self-pay | Admitting: Pharmacy Technician

## 2013-07-06 ENCOUNTER — Other Ambulatory Visit (HOSPITAL_COMMUNITY): Payer: Self-pay | Admitting: *Deleted

## 2013-07-06 NOTE — Pre-Procedure Instructions (Signed)
ESTILL LLERENA  07/06/2013   Your procedure is scheduled on:  Wednesday, July 13, 2013 at 9:30 AM.   Report to [facility] Entrance "A" Admitting Office at 7:30 AM.   Call this number if you have problems the morning of surgery: [phone]   Remember:   Do not eat food or drink liquids after midnight Tuesday, 07/12/13.   Take these medicines the morning of surgery with A SIP OF WATER: amoxicillin-clavulanate (AUGMENTIN), buPROPion (WELLBUTRIN), cyclobenzaprine (FLEXERIL) - if needed, esomeprazole (NEXIUM) - if needed, fexofenadine (ALLEGRA) - if needed, fluticasone (FLONASE) - if needed, oxyCODONE-acetaminophen (PERCOCET) - if needed.  Stop all Vitamins as of today.     Do not wear jewelry.  Do not wear lotions, powders, or cologne. You may wear deodorant.  Men may shave face and neck.  Do not bring valuables to the hospital.  [facility] is not responsible  for any belongings or valuables.               Contacts, dentures or bridgework may not be worn into surgery.  Leave suitcase in the car. After surgery it may be brought to your room.  For patients admitted to the hospital, discharge time is determined by your   treatment team.                 Special Instructions: Shower using CHG 2 nights before surgery and the night before surgery.  If you shower the day of surgery use CHG.  Use special wash - you have one bottle of CHG for all showers.  You should use approximately 1/3 of the bottle for each shower.   Please read over the following fact sheets that you were given: Pain Booklet, Coughing and Deep Breathing, MRSA Information and Surgical Site Infection Prevention

## 2013-07-07 ENCOUNTER — Encounter (HOSPITAL_COMMUNITY): Payer: Self-pay

## 2013-07-07 ENCOUNTER — Encounter (HOSPITAL_COMMUNITY)
Admission: RE | Admit: 2013-07-07 | Discharge: 2013-07-07 | Disposition: A | Discharge status: Home / Self Care | Payer: BC Managed Care – PPO | Source: Ambulatory Visit | Attending: Neurosurgery | Admitting: Neurosurgery

## 2013-07-07 DIAGNOSIS — Z01818 Encounter for other preprocedural examination: Secondary | ICD-10-CM | POA: Insufficient documentation

## 2013-07-07 DIAGNOSIS — Z01812 Encounter for preprocedural laboratory examination: Secondary | ICD-10-CM | POA: Insufficient documentation

## 2013-07-07 HISTORY — DX: Sleep apnea, unspecified: G47.30

## 2013-07-07 LAB — CBC
Hemoglobin: 13.7 g/dL (ref 13.0–17.0)
MCV: 89.8 fL (ref 78.0–100.0)
Platelets: 216 10*3/uL (ref 150–400)
RDW: 13.4 % (ref 11.5–15.5)
WBC: 4.3 10*3/uL (ref 4.0–10.5)

## 2013-07-07 LAB — BASIC METABOLIC PANEL
BUN: 10 mg/dL (ref 6–23)
CO2: 30 mEq/L (ref 19–32)
Calcium: 9.4 mg/dL (ref 8.4–10.5)
Creatinine, Ser: 0.99 mg/dL (ref 0.50–1.35)
GFR calc Af Amer: >90 mL/min (ref >90)

## 2013-07-07 LAB — SURGICAL PCR SCREEN: MRSA, PCR: NEGATIVE

## 2013-07-07 NOTE — Progress Notes (Signed)
Dr Lonie Peak office made aware that orders in Epic need to be signed.

## 2013-07-13 ENCOUNTER — Encounter (HOSPITAL_COMMUNITY): Payer: BC Managed Care – PPO | Admitting: Certified Registered Nurse Anesthetist

## 2013-07-13 ENCOUNTER — Encounter (HOSPITAL_COMMUNITY)
Admission: RE | Disposition: A | Discharge status: Home / Self Care | Payer: Self-pay | Source: Ambulatory Visit | Attending: Neurosurgery

## 2013-07-13 ENCOUNTER — Inpatient Hospital Stay (HOSPITAL_COMMUNITY): Payer: BC Managed Care – PPO

## 2013-07-13 ENCOUNTER — Encounter (HOSPITAL_COMMUNITY): Payer: Self-pay | Admitting: *Deleted

## 2013-07-13 ENCOUNTER — Inpatient Hospital Stay (HOSPITAL_COMMUNITY)
Admission: RE | Admit: 2013-07-13 | Discharge: 2013-07-14 | DRG: 472 | Disposition: A | Discharge status: Home / Self Care | Payer: BC Managed Care – PPO | Source: Ambulatory Visit | Attending: Neurosurgery | Admitting: Neurosurgery

## 2013-07-13 ENCOUNTER — Inpatient Hospital Stay (HOSPITAL_COMMUNITY): Payer: BC Managed Care – PPO | Admitting: Certified Registered Nurse Anesthetist

## 2013-07-13 DIAGNOSIS — F329 Major depressive disorder, single episode, unspecified: Secondary | ICD-10-CM | POA: Diagnosis present

## 2013-07-13 DIAGNOSIS — Z888 Allergy status to other drugs, medicaments and biological substances status: Secondary | ICD-10-CM

## 2013-07-13 DIAGNOSIS — Z833 Family history of diabetes mellitus: Secondary | ICD-10-CM

## 2013-07-13 DIAGNOSIS — F4310 Post-traumatic stress disorder, unspecified: Secondary | ICD-10-CM | POA: Diagnosis present

## 2013-07-13 DIAGNOSIS — Z79899 Other long term (current) drug therapy: Secondary | ICD-10-CM

## 2013-07-13 DIAGNOSIS — K219 Gastro-esophageal reflux disease without esophagitis: Secondary | ICD-10-CM | POA: Diagnosis present

## 2013-07-13 DIAGNOSIS — M4802 Spinal stenosis, cervical region: Secondary | ICD-10-CM | POA: Diagnosis present

## 2013-07-13 DIAGNOSIS — Z87891 Personal history of nicotine dependence: Secondary | ICD-10-CM

## 2013-07-13 DIAGNOSIS — M47812 Spondylosis without myelopathy or radiculopathy, cervical region: Principal | ICD-10-CM | POA: Diagnosis present

## 2013-07-13 DIAGNOSIS — Z8249 Family history of ischemic heart disease and other diseases of the circulatory system: Secondary | ICD-10-CM

## 2013-07-13 DIAGNOSIS — I10 Essential (primary) hypertension: Secondary | ICD-10-CM | POA: Diagnosis present

## 2013-07-13 DIAGNOSIS — M5030 Other cervical disc degeneration, unspecified cervical region: Secondary | ICD-10-CM | POA: Diagnosis present

## 2013-07-13 DIAGNOSIS — F3289 Other specified depressive episodes: Secondary | ICD-10-CM | POA: Diagnosis present

## 2013-07-13 DIAGNOSIS — M4000 Postural kyphosis, site unspecified: Secondary | ICD-10-CM | POA: Diagnosis present

## 2013-07-13 DIAGNOSIS — G959 Disease of spinal cord, unspecified: Secondary | ICD-10-CM | POA: Diagnosis present

## 2013-07-13 HISTORY — PX: ANTERIOR CERVICAL DECOMP/DISCECTOMY FUSION: SHX1161

## 2013-07-13 SURGERY — ANTERIOR CERVICAL DECOMPRESSION/DISCECTOMY FUSION 3 LEVELS
Anesthesia: General | Site: Neck

## 2013-07-13 MED ORDER — SODIUM CHLORIDE 0.9 % IR SOLN
Status: DC | PRN
  Administered 2013-07-13: 10:00:00

## 2013-07-13 MED ORDER — VITAMIN B-12 1000 MCG PO TABS
1000.0000 ug | ORAL_TABLET | Freq: Every day | ORAL | Status: DC
Start: 2013-07-14 — End: 2013-07-14
  Filled 2013-07-13: qty 1

## 2013-07-13 MED ORDER — ALUM & MAG HYDROXIDE-SIMETH 200-200-20 MG/5ML PO SUSP: 30.0000 mL | Freq: Four times a day (QID) | ORAL | Status: DC | PRN

## 2013-07-13 MED ORDER — ATORVASTATIN CALCIUM 40 MG PO TABS
40.0000 mg | ORAL_TABLET | Freq: Every day | ORAL | Status: DC
  Administered 2013-07-13: 40 mg via ORAL
  Filled 2013-07-13 (×2): qty 1

## 2013-07-13 MED ORDER — ROCURONIUM BROMIDE 100 MG/10ML IV SOLN
INTRAVENOUS | Status: DC | PRN
  Administered 2013-07-13: 50 mg via INTRAVENOUS

## 2013-07-13 MED ORDER — BUPROPION HCL 100 MG PO TABS
100.0000 mg | ORAL_TABLET | Freq: Two times a day (BID) | ORAL | Status: DC
  Administered 2013-07-13: 100 mg via ORAL
  Filled 2013-07-13 (×4): qty 1

## 2013-07-13 MED ORDER — TRAZODONE HCL 100 MG PO TABS
100.0000 mg | ORAL_TABLET | Freq: Every day | ORAL | Status: DC
  Administered 2013-07-13: 100 mg via ORAL
  Filled 2013-07-13 (×3): qty 1

## 2013-07-13 MED ORDER — HYDROCORTISONE ACETATE 25 MG RE SUPP
25.0000 mg | Freq: Two times a day (BID) | RECTAL | Status: DC
  Filled 2013-07-13 (×3): qty 1

## 2013-07-13 MED ORDER — VANCOMYCIN HCL IN DEXTROSE 1-5 GM/200ML-% IV SOLN
INTRAVENOUS | Status: AC
  Administered 2013-07-13: 1000 mg via INTRAVENOUS
  Filled 2013-07-13: qty 200

## 2013-07-13 MED ORDER — HYDROMORPHONE HCL PF 1 MG/ML IJ SOLN
INTRAMUSCULAR | Status: AC
  Filled 2013-07-13: qty 1

## 2013-07-13 MED ORDER — NEOSTIGMINE METHYLSULFATE 1 MG/ML IJ SOLN
INTRAMUSCULAR | Status: DC | PRN
  Administered 2013-07-13: 4 mg via INTRAVENOUS

## 2013-07-13 MED ORDER — ONDANSETRON HCL 4 MG/2ML IJ SOLN: 4.0000 mg | INTRAMUSCULAR | Status: DC | PRN

## 2013-07-13 MED ORDER — MIDAZOLAM HCL 5 MG/5ML IJ SOLN
INTRAMUSCULAR | Status: DC | PRN
  Administered 2013-07-13 (×2): 1 mg via INTRAVENOUS

## 2013-07-13 MED ORDER — DOCUSATE SODIUM 100 MG PO CAPS
100.0000 mg | ORAL_CAPSULE | Freq: Two times a day (BID) | ORAL | Status: DC
  Administered 2013-07-13: 100 mg via ORAL
  Filled 2013-07-13: qty 1

## 2013-07-13 MED ORDER — LACTATED RINGERS IV SOLN
INTRAVENOUS | Status: DC | PRN
  Administered 2013-07-13 (×2): via INTRAVENOUS

## 2013-07-13 MED ORDER — ESMOLOL HCL 10 MG/ML IV SOLN
INTRAVENOUS | Status: DC | PRN
  Administered 2013-07-13 (×4): 10 mg via INTRAVENOUS

## 2013-07-13 MED ORDER — PHENOL 1.4 % MT LIQD: 1.0000 | OROMUCOSAL | Status: DC | PRN

## 2013-07-13 MED ORDER — LIDOCAINE HCL (CARDIAC) 20 MG/ML IV SOLN
INTRAVENOUS | Status: DC | PRN
  Administered 2013-07-13: 60 mg via INTRAVENOUS
  Administered 2013-07-13: 10 mg via INTRAVENOUS

## 2013-07-13 MED ORDER — CYCLOBENZAPRINE HCL 10 MG PO TABS
10.0000 mg | ORAL_TABLET | Freq: Three times a day (TID) | ORAL | Status: DC | PRN
  Administered 2013-07-13 (×2): 10 mg via ORAL
  Filled 2013-07-13 (×2): qty 1

## 2013-07-13 MED ORDER — HYDROCHLOROTHIAZIDE 25 MG PO TABS
25.0000 mg | ORAL_TABLET | Freq: Every day | ORAL | Status: DC
  Filled 2013-07-13: qty 1

## 2013-07-13 MED ORDER — ACETAMINOPHEN 650 MG RE SUPP: 650.0000 mg | RECTAL | Status: DC | PRN

## 2013-07-13 MED ORDER — OXYCODONE HCL 5 MG PO TABS
15.0000 mg | ORAL_TABLET | ORAL | Status: DC | PRN
Start: 2013-07-13 — End: 2013-07-14
  Administered 2013-07-13: 15 mg via ORAL
  Filled 2013-07-13: qty 3

## 2013-07-13 MED ORDER — DEXAMETHASONE SODIUM PHOSPHATE 4 MG/ML IJ SOLN
INTRAMUSCULAR | Status: DC | PRN
  Administered 2013-07-13: 10 mg via INTRAVENOUS

## 2013-07-13 MED ORDER — ONDANSETRON HCL 4 MG/2ML IJ SOLN
INTRAMUSCULAR | Status: DC | PRN
  Administered 2013-07-13: 4 mg via INTRAVENOUS

## 2013-07-13 MED ORDER — THROMBIN 20000 UNITS EX SOLR
CUTANEOUS | Status: DC | PRN
  Administered 2013-07-13: 11:00:00 via TOPICAL

## 2013-07-13 MED ORDER — SENNA 8.6 MG PO TABS: 1.0000 | ORAL_TABLET | Freq: Every evening | ORAL | Status: DC | PRN

## 2013-07-13 MED ORDER — HYDROMORPHONE HCL PF 1 MG/ML IJ SOLN
0.5000 mg | INTRAMUSCULAR | Status: DC | PRN
  Administered 2013-07-13 (×2): 1 mg via INTRAVENOUS
  Filled 2013-07-13 (×2): qty 1

## 2013-07-13 MED ORDER — PROPOFOL 10 MG/ML IV BOLUS
INTRAVENOUS | Status: DC | PRN
  Administered 2013-07-13: 200 mg via INTRAVENOUS

## 2013-07-13 MED ORDER — SODIUM CHLORIDE 0.9 % IJ SOLN: 3.0000 mL | Freq: Two times a day (BID) | INTRAMUSCULAR | Status: DC

## 2013-07-13 MED ORDER — SUCCINYLCHOLINE CHLORIDE 20 MG/ML IJ SOLN
INTRAMUSCULAR | Status: DC | PRN
  Administered 2013-07-13: 100 mg via INTRAVENOUS

## 2013-07-13 MED ORDER — SODIUM CHLORIDE 0.9 % IV SOLN: 250.0000 mL | INTRAVENOUS | Status: DC

## 2013-07-13 MED ORDER — HYDROMORPHONE HCL PF 1 MG/ML IJ SOLN
INTRAMUSCULAR | Status: DC | PRN
  Administered 2013-07-13 (×2): 0.5 mg via INTRAVENOUS

## 2013-07-13 MED ORDER — PANTOPRAZOLE SODIUM 40 MG PO TBEC: 40.0000 mg | DELAYED_RELEASE_TABLET | Freq: Every day | ORAL | Status: DC

## 2013-07-13 MED ORDER — OXYCODONE-ACETAMINOPHEN 5-325 MG PO TABS
1.0000 | ORAL_TABLET | ORAL | Status: DC | PRN
  Administered 2013-07-13: 2 via ORAL
  Filled 2013-07-13: qty 2

## 2013-07-13 MED ORDER — HYDROCODONE-ACETAMINOPHEN 5-325 MG PO TABS
1.0000 | ORAL_TABLET | ORAL | Status: DC | PRN
  Administered 2013-07-13 – 2013-07-14 (×3): 2 via ORAL
  Filled 2013-07-13 (×3): qty 2

## 2013-07-13 MED ORDER — DEXAMETHASONE SODIUM PHOSPHATE 10 MG/ML IJ SOLN
10.0000 mg | Freq: Four times a day (QID) | INTRAMUSCULAR | Status: DC
  Administered 2013-07-13 – 2013-07-14 (×3): 10 mg via INTRAVENOUS
  Filled 2013-07-13 (×5): qty 1

## 2013-07-13 MED ORDER — HYDROMORPHONE HCL PF 1 MG/ML IJ SOLN
0.2500 mg | INTRAMUSCULAR | Status: DC | PRN
  Administered 2013-07-13 (×4): 0.5 mg via INTRAVENOUS
  Administered 2013-07-13: 5 mg via INTRAVENOUS

## 2013-07-13 MED ORDER — ACETAMINOPHEN 325 MG PO TABS: 650.0000 mg | ORAL_TABLET | ORAL | Status: DC | PRN

## 2013-07-13 MED ORDER — LACTATED RINGERS IV SOLN
INTRAVENOUS | Status: DC
  Administered 2013-07-13: 09:00:00 via INTRAVENOUS

## 2013-07-13 MED ORDER — VANCOMYCIN HCL IN DEXTROSE 1-5 GM/200ML-% IV SOLN
1000.0000 mg | Freq: Once | INTRAVENOUS | Status: AC
  Administered 2013-07-13: 1000 mg via INTRAVENOUS
  Filled 2013-07-13 (×2): qty 200

## 2013-07-13 MED ORDER — CEFAZOLIN SODIUM 1-5 GM-% IV SOLN: 1.0000 g | Freq: Three times a day (TID) | INTRAVENOUS | Status: DC

## 2013-07-13 MED ORDER — SODIUM CHLORIDE 0.9 % IJ SOLN: 3.0000 mL | INTRAMUSCULAR | Status: DC | PRN

## 2013-07-13 MED ORDER — LORATADINE 10 MG PO TABS
10.0000 mg | ORAL_TABLET | Freq: Every day | ORAL | Status: DC
  Filled 2013-07-13: qty 1

## 2013-07-13 MED ORDER — POLYETHYLENE GLYCOL 3350 17 G PO PACK
17.0000 g | PACK | Freq: Every day | ORAL | Status: DC | PRN
  Filled 2013-07-13: qty 1

## 2013-07-13 MED ORDER — 0.9 % SODIUM CHLORIDE (POUR BTL) OPTIME
TOPICAL | Status: DC | PRN
  Administered 2013-07-13: 1000 mL

## 2013-07-13 MED ORDER — ARTIFICIAL TEARS OP OINT
TOPICAL_OINTMENT | OPHTHALMIC | Status: DC | PRN
  Administered 2013-07-13: 1 via OPHTHALMIC

## 2013-07-13 MED ORDER — OXYCODONE-ACETAMINOPHEN 5-325 MG PO TABS: 1.5000 | ORAL_TABLET | Freq: Four times a day (QID) | ORAL | Status: DC | PRN

## 2013-07-13 MED ORDER — FENTANYL CITRATE 0.05 MG/ML IJ SOLN
INTRAMUSCULAR | Status: DC | PRN
  Administered 2013-07-13: 100 ug via INTRAVENOUS
  Administered 2013-07-13: 150 ug via INTRAVENOUS
  Administered 2013-07-13 (×3): 50 ug via INTRAVENOUS
  Administered 2013-07-13: 100 ug via INTRAVENOUS

## 2013-07-13 MED ORDER — GLYCOPYRROLATE 0.2 MG/ML IJ SOLN
INTRAMUSCULAR | Status: DC | PRN
  Administered 2013-07-13: 0.6 mg via INTRAVENOUS

## 2013-07-13 MED ORDER — ZOLPIDEM TARTRATE 5 MG PO TABS: 5.0000 mg | ORAL_TABLET | Freq: Every evening | ORAL | Status: DC | PRN

## 2013-07-13 MED ORDER — CYCLOBENZAPRINE HCL 10 MG PO TABS
10.0000 mg | ORAL_TABLET | Freq: Three times a day (TID) | ORAL | Status: DC | PRN
  Filled 2013-07-13: qty 1

## 2013-07-13 MED ORDER — FLUTICASONE PROPIONATE 50 MCG/ACT NA SUSP: 2.0000 | Freq: Every day | NASAL | Status: DC | PRN

## 2013-07-13 MED ORDER — HYDROMORPHONE HCL PF 1 MG/ML IJ SOLN: 0.2500 mg | INTRAMUSCULAR | Status: DC | PRN

## 2013-07-13 MED ORDER — MENTHOL 3 MG MT LOZG: 1.0000 | LOZENGE | OROMUCOSAL | Status: DC | PRN

## 2013-07-13 MED ORDER — GELATIN ABSORBABLE MT POWD
OROMUCOSAL | Status: DC | PRN
  Administered 2013-07-13 (×2): via TOPICAL

## 2013-07-13 SURGICAL SUPPLY — 67 items
BAG DECANTER FOR FLEXI CONT (MISCELLANEOUS) ×2 IMPLANT
BENZOIN TINCTURE PRP APPL 2/3 (GAUZE/BANDAGES/DRESSINGS) ×2 IMPLANT
BIT DRILL 13 (BIT) ×2 IMPLANT
BRUSH SCRUB EZ PLAIN DRY (MISCELLANEOUS) ×2 IMPLANT
BUR MATCHSTICK NEURO 3.0 LAGG (BURR) ×2 IMPLANT
CANISTER SUCT 3000ML (MISCELLANEOUS) ×2 IMPLANT
CONT SPEC 4OZ CLIKSEAL STRL BL (MISCELLANEOUS) ×2 IMPLANT
DECANTER SPIKE VIAL GLASS SM (MISCELLANEOUS) ×2 IMPLANT
DERMABOND ADHESIVE PROPEN (GAUZE/BANDAGES/DRESSINGS) ×1
DERMABOND ADVANCED (GAUZE/BANDAGES/DRESSINGS) ×1
DERMABOND ADVANCED .7 DNX12 (GAUZE/BANDAGES/DRESSINGS) ×1 IMPLANT
DERMABOND ADVANCED .7 DNX6 (GAUZE/BANDAGES/DRESSINGS) ×1 IMPLANT
DRAIN SNY WOU 7FLT (WOUND CARE) ×2 IMPLANT
DRAPE C-ARM 42X72 X-RAY (DRAPES) ×4 IMPLANT
DRAPE LAPAROTOMY 100X72 PEDS (DRAPES) ×2 IMPLANT
DRAPE MICROSCOPE ZEISS OPMI (DRAPES) ×2 IMPLANT
DRAPE POUCH INSTRU U-SHP 10X18 (DRAPES) ×2 IMPLANT
DRSG OPSITE 4X5.5 SM (GAUZE/BANDAGES/DRESSINGS) ×2 IMPLANT
DRSG OPSITE POSTOP 3X4 (GAUZE/BANDAGES/DRESSINGS) ×2 IMPLANT
DRSG OPSITE POSTOP 4X6 (GAUZE/BANDAGES/DRESSINGS) ×2 IMPLANT
DURAPREP 6ML APPLICATOR 50/CS (WOUND CARE) ×2 IMPLANT
ELECT COATED BLADE 2.86 ST (ELECTRODE) ×2 IMPLANT
ELECT REM PT RETURN 9FT ADLT (ELECTROSURGICAL) ×2
ELECTRODE REM PT RTRN 9FT ADLT (ELECTROSURGICAL) ×1 IMPLANT
EVACUATOR SILICONE 100CC (DRAIN) ×2 IMPLANT
GAUZE SPONGE 4X4 16PLY XRAY LF (GAUZE/BANDAGES/DRESSINGS) IMPLANT
GLOVE BIO SURGEON STRL SZ8 (GLOVE) ×2 IMPLANT
GLOVE BIOGEL PI IND STRL 7.0 (GLOVE) ×4 IMPLANT
GLOVE BIOGEL PI INDICATOR 7.0 (GLOVE) ×4
GLOVE ECLIPSE 6.5 STRL STRAW (GLOVE) ×2 IMPLANT
GLOVE EXAM NITRILE LRG STRL (GLOVE) IMPLANT
GLOVE EXAM NITRILE MD LF STRL (GLOVE) IMPLANT
GLOVE EXAM NITRILE XL STR (GLOVE) IMPLANT
GLOVE EXAM NITRILE XS STR PU (GLOVE) IMPLANT
GLOVE INDICATOR 8.5 STRL (GLOVE) ×2 IMPLANT
GLOVE SURG SS PI 7.0 STRL IVOR (GLOVE) ×8 IMPLANT
GOWN BRE IMP SLV AUR LG STRL (GOWN DISPOSABLE) ×2 IMPLANT
GOWN BRE IMP SLV AUR XL STRL (GOWN DISPOSABLE) ×6 IMPLANT
GOWN STRL REIN 2XL LVL4 (GOWN DISPOSABLE) IMPLANT
HEAD HALTER (SOFTGOODS) ×2 IMPLANT
HEMOSTAT POWDER KIT SURGIFOAM (HEMOSTASIS) IMPLANT
HEMOSTAT POWDER SURGIFOAM 1G (HEMOSTASIS) ×4 IMPLANT
KIT BASIN OR (CUSTOM PROCEDURE TRAY) ×2 IMPLANT
KIT ROOM TURNOVER OR (KITS) ×2 IMPLANT
NEEDLE SPNL 20GX3.5 QUINCKE YW (NEEDLE) ×2 IMPLANT
NS IRRIG 1000ML POUR BTL (IV SOLUTION) ×2 IMPLANT
PACK LAMINECTOMY NEURO (CUSTOM PROCEDURE TRAY) ×2 IMPLANT
PLATE 3 57.5XLCK NS SPNE CVD (Plate) ×1 IMPLANT
PLATE 3 ATLANTIS TRANS (Plate) ×1 IMPLANT
PUTTY BONE DBX 2.5 MIS (Bone Implant) ×2 IMPLANT
RUBBERBAND STERILE (MISCELLANEOUS) ×4 IMPLANT
SCREW ST 15X4XST FXANG NS (Screw) ×8 IMPLANT
SCREW ST FIX 4 ATL (Screw) ×8 IMPLANT
SPACER COLONIAL 6X14X12 (Spacer) ×2 IMPLANT
SPACER COLONIAL 7X14X12 (Spacer) ×4 IMPLANT
SPONGE GAUZE 4X4 12PLY (GAUZE/BANDAGES/DRESSINGS) ×2 IMPLANT
SPONGE INTESTINAL PEANUT (DISPOSABLE) ×2 IMPLANT
SPONGE SURGIFOAM ABS GEL 100 (HEMOSTASIS) ×2 IMPLANT
STRIP CLOSURE SKIN 1/2X4 (GAUZE/BANDAGES/DRESSINGS) ×2 IMPLANT
SUT VIC AB 3-0 SH 8-18 (SUTURE) ×2 IMPLANT
SUT VICRYL 4-0 PS2 18IN ABS (SUTURE) ×2 IMPLANT
SYR 20ML ECCENTRIC (SYRINGE) ×2 IMPLANT
TAPE CLOTH 4X10 WHT NS (GAUZE/BANDAGES/DRESSINGS) IMPLANT
TOWEL OR 17X24 6PK STRL BLUE (TOWEL DISPOSABLE) ×2 IMPLANT
TOWEL OR 17X26 10 PK STRL BLUE (TOWEL DISPOSABLE) ×2 IMPLANT
TRAP SPECIMEN MUCOUS 40CC (MISCELLANEOUS) ×2 IMPLANT
WATER STERILE IRR 1000ML POUR (IV SOLUTION) ×2 IMPLANT

## 2013-07-13 NOTE — Transfer of Care (Signed)
Immediate Anesthesia Transfer of Care Note  Patient: Cesar Knight  Procedure(s) Performed: Procedure(s) with comments: ANTERIOR CERVICAL DECOMPRESSION/DISCECTOMY FUSION 3 LEVELS (N/A) - ANTERIOR CERVICAL DECOMPRESSION/DISCECTOMY FUSION 3 LEVELS  Patient Location: PACU  Anesthesia Type:General  Level of Consciousness: patient cooperative and responds to stimulation  Airway & Oxygen Therapy: Patient Spontanous Breathing and Patient connected to nasal cannula oxygen  Post-op Assessment: Report given to PACU RN, Post -op Vital signs reviewed and stable and Patient moving all extremities X 4  Post vital signs: Reviewed and stable  Complications: No apparent anesthesia complications

## 2013-07-13 NOTE — Plan of Care (Signed)
Pt did IS and got up to 2000 ml. Told to do 10 times every 2 hours, v/u.

## 2013-07-13 NOTE — H&P (Signed)
Cesar Knight is an 57 y.o. male.   Chief Complaint: Neck right shoulder and arm pain HPI: Patient is a very pleasant 57 year old some is a progress worsening neck pain and pain in both arms predominantly right shoulder and arm with numbness tingling in his hand and fingers. This been refractory to all forms of conservative treatment physical therapy anti-inflammatories and time workup has shown severe cervical spondylosis with stenosis spinal cord compression and 5 foraminal stenosis predominantly at C6-7 and C5-6 but also kyphosis motor changes and stenosis at C4-5. Due to his failure conservative treatment progression of clinical syndrome and imaging findings I recommended anterior cervical discectomy fusion at C4-5, C5-6, C6-7 I extensively reviewed the risks and benefits of the operation the patient as well as perioperative course expectations about alternatives of surgery he understood and agreed to proceed forward.  Past Medical History  Diagnosis Date  . Depression   . GERD (gastroesophageal reflux disease)     per Dr. Russella Dar  . Hypertension   . Allergy   . Headache(784.0)   . Insomnia   . Subdural hematoma 1997  . Erectile dysfunction   . Hemorrhoids   . Low back pain   . Osteoarthritis   . Post traumatic stress disorder (PTSD)     from emotional trauma during serivce in the [employer]  . Complication of anesthesia     difficulty urinating   . Family history of anesthesia complication     Mother reports that it takes a while for her to wake up  . Diverticulitis   . Bipartite patella   . Sleep apnea     years ago in [place]    Past Surgical History  Procedure Laterality Date  . Burr hole for subdural hematoma  1997  . Lumbar laminectomy  2000  . Vasectomy    . Knee arthroscopy  2005    right knee, repeat right knee arthoscopy 01/29/10 per Dr. Dorene Grebe  . Ulnar nerve transposition    . Cataract extraction Right 1985  . Eye surgery    . Colonoscopy      Family History  Problem  Relation Age of Onset  . Alcohol abuse    . Cancer      colon first degree relative  . Diabetes      first degree relative  . Hyperlipidemia      family history  . Hypertension      family history  . Mental illness      family history  . Sudden death      family history  . Heart disease      family history   Social History:  reports that he quit smoking about 10 years ago. His smoking use included Cigarettes. He has a 15 pack-year smoking history. He has never used smokeless tobacco. He reports that he drinks about 2.0 ounces of alcohol per week. He reports that he does not use illicit drugs.  Allergies:  Allergies  Allergen Reactions  . Avelox [Moxifloxacin Hcl In Nacl] Anaphylaxis  . Cefdinir     REACTION: itching, swelling  . Prochlorperazine Edisylate     REACTION: "Flighty" compazine    Medications Prior to Admission  Medication Sig Dispense Refill  . amoxicillin-clavulanate (AUGMENTIN) 875-125 MG per tablet Take 1 tablet by mouth 2 (two) times daily.  20 tablet  0  . buPROPion (WELLBUTRIN) 100 MG tablet Take 100 mg by mouth 2 (two) times daily.      . cyclobenzaprine (FLEXERIL) 10  MG tablet Take 1 tablet (10 mg total) by mouth 3 (three) times daily as needed for muscle spasms.  80 tablet  1  . esomeprazole (NEXIUM) 40 MG capsule Take 40 mg by mouth 2 (two) times daily as needed. For heartburn      . fexofenadine (ALLEGRA) 180 MG tablet Take 180 mg by mouth daily as needed for allergies.       . fluticasone (FLONASE) 50 MCG/ACT nasal spray Place 2 sprays into the nose daily as needed for rhinitis.       . hydrochlorothiazide 25 MG tablet Take 25 mg by mouth daily.       Marland Kitchen oxyCODONE-acetaminophen (PERCOCET) 7.5-325 MG per tablet Take 1-2 tablets by mouth every 6 (six) hours as needed. For pain      . polyethylene glycol (MIRALAX / GLYCOLAX) packet Take 17 g by mouth daily as needed (for daily soft stools).      . senna (SENOKOT) 8.6 MG TABS tablet Take 1 tablet by mouth  at bedtime as needed for mild constipation.      . simvastatin (ZOCOR) 80 MG tablet Take 40 mg by mouth 2 (two) times daily.      . traZODone (DESYREL) 100 MG tablet Take 100 mg by mouth at bedtime.      . vitamin B-12 (CYANOCOBALAMIN) 1000 MCG tablet Take 1,000 mcg by mouth daily.      . hydrocortisone (ANUSOL-HC) 25 MG suppository Place 1 suppository (25 mg total) rectally 2 (two) times daily.  12 suppository  0  . sildenafil (VIAGRA) 100 MG tablet Take 100 mg by mouth daily as needed for erectile dysfunction.        No results found for this or any previous visit (from the past 48 hour(s)). No results found.  Review of Systems  Constitutional: Negative.   Eyes: Negative.   Respiratory: Negative.   Cardiovascular: Negative.   Gastrointestinal: Negative.   Genitourinary: Negative.   Musculoskeletal: Positive for joint pain, myalgias and neck pain.  Skin: Negative.   Neurological: Positive for tingling and sensory change.  Endo/Heme/Allergies: Negative.   Psychiatric/Behavioral: Negative.     Blood pressure 143/79, pulse 94, temperature 97.9 F (36.6 C), temperature source Oral, resp. rate 18, SpO2 98.00%. Physical Exam  Constitutional: He is oriented to person, place, and time. He appears well-developed and well-nourished.  HENT:  Head: Normocephalic and atraumatic.  Eyes: Pupils are equal, round, and reactive to light.  Neck: Normal range of motion.  Cardiovascular: Normal rate.   Respiratory: Effort normal.  GI: Soft.  Neurological: He is alert and oriented to person, place, and time. He has normal strength. GCS eye subscore is 4. GCS verbal subscore is 5. GCS motor subscore is 6.  Reflex Scores:      Tricep reflexes are 2+ on the right side and 2+ on the left side.      Bicep reflexes are 2+ on the right side and 2+ on the left side.      Brachioradialis reflexes are 2+ on the right side and 2+ on the left side.      Patellar reflexes are 2+ on the right side and 2+ on  the left side.      Achilles reflexes are 2+ on the right side and 2+ on the left side. Strength is 5 out of 5 in his deltoids, biceps, triceps wrist flexion, wrist extension, and intrinsics.  Skin: Skin is warm and dry.     Assessment/Plan 57 year old gentleman presents for  an ACDF at C4-5, C5-6, C6-7.  Alauna Hayden P 07/13/2013, 10:13 AM

## 2013-07-13 NOTE — Plan of Care (Signed)
On call MD paged per pt request. Pt would like ambien for sleep tonight. Waiting for call back.

## 2013-07-13 NOTE — Progress Notes (Signed)
Orthopedic Tech Progress Note Patient Details:  Cesar Knight 1956/06/07 [id number]  Ortho Devices Type of Ortho Device: Soft collar Ortho Device/Splint Location: neck Ortho Device/Splint Interventions: Ordered;Application   [name] 07/13/2013, 5:26 PM

## 2013-07-13 NOTE — Preoperative (Signed)
Beta Blockers   Reason not to administer Beta Blockers:Not Applicable 

## 2013-07-13 NOTE — Anesthesia Postprocedure Evaluation (Signed)
  Anesthesia Post-op Note  Patient: Cesar Knight  Procedure(s) Performed: Procedure(s) with comments: ANTERIOR CERVICAL DECOMPRESSION/DISCECTOMY FUSION 3 LEVELS (N/A) - ANTERIOR CERVICAL DECOMPRESSION/DISCECTOMY FUSION 3 LEVELS  Patient Location: PACU  Anesthesia Type:General  Level of Consciousness: awake  Airway and Oxygen Therapy: Patient Spontanous Breathing  Post-op Pain: mild  Post-op Assessment: Post-op Vital signs reviewed  Post-op Vital Signs: Reviewed  Complications: No apparent anesthesia complications

## 2013-07-13 NOTE — Anesthesia Preprocedure Evaluation (Addendum)
Anesthesia Evaluation  Patient identified by MRN, date of birth, ID band Patient awake, Patient confused and Patient unresponsive    Reviewed: Allergy & Precautions, H&P , NPO status , Patient's Chart, lab work & pertinent test results  Airway Mallampati: II TM Distance: >3 FB Neck ROM: Limited    Dental  (+) Teeth Intact and Dental Advisory Given   Pulmonary sleep apnea , former smoker,  breath sounds clear to auscultation        Cardiovascular hypertension, Pt. on medications Rhythm:Regular Rate:Normal     Neuro/Psych  Headaches, Anxiety    GI/Hepatic Neg liver ROS, GERD-  Medicated and Controlled,  Endo/Other  negative endocrine ROS  Renal/GU negative Renal ROS     Musculoskeletal   Abdominal Normal abdominal exam  (+)   Peds  Hematology negative hematology ROS (+)   Anesthesia Other Findings   Reproductive/Obstetrics                         Anesthesia Physical Anesthesia Plan  ASA: III  Anesthesia Plan: General   Post-op Pain Management:    Induction: Intravenous  Airway Management Planned: Oral ETT and Video Laryngoscope Planned  Additional Equipment:   Intra-op Plan:   Post-operative Plan: Extubation in OR  Informed Consent: I have reviewed the patients History and Physical, chart, labs and discussed the procedure including the risks, benefits and alternatives for the proposed anesthesia with the patient or authorized representative who has indicated his/her understanding and acceptance.   Dental advisory given  Plan Discussed with: CRNA, Anesthesiologist and Surgeon  Anesthesia Plan Comments:       Anesthesia Quick Evaluation

## 2013-07-13 NOTE — Op Note (Signed)
Preoperative diagnosis: Cervical spondylosis with stenosis and right-sided C5, C6, and C7 radiculopathies  Postoperative diagnosis: Same  Procedure: Anterior cervical discectomies and fusion at C4-5, C5-6, and C6-7 using globus peek cages packed with locally harvested autograft mixed with DBX and Atlantis translational plating system with 15 mm fixed angle screws  Surgeon: Jillyn Hidden Onesha Krebbs  System: Coletta Memos  Anesthesia: Gen.  EBL: Minimal  History of present illness: Patient is a very pleasant 57 year old gentleman is a progress worsening neck and right shoulder pain radiating into his deltoids and benefits have numbness in his first fingers of his right hand. Workup revealed severe cervical spondylosis with degenerative disc disease Modic changes spondylitic compression of the spinal cord and both C5, C6, and C7 nerve roots. Due to patient's failure conservative treatment imaging findings and progression of clinical syndrome I recommended anterior cervical discectomies and fusion at C4-5, C5-6, C6-7 I extensively reviewed the risks and benefits of the operation the patient as well as perioperative course and expectations of outcome and alternatives to surgery and he understood and agreed to proceed forward.  Operative procedure: Patient brought into the or was induced under general anesthesia positioned supine the neck in slight extension in 5 pounds of halter traction the right side his neck was prepped and draped in routine sterile fashion. Preoperative x-ray localize the appropriate level so a curvilinear incision was made just up midline to the interbody the sternomastoid and the superficial layer of the platysmas dissected and divided longitudinally the avascular plane to sternomastoid and strap as was developed down through the fascia precautions doesn't [place]. Interoperative x-ray confirmed at the fascia proper level so the lungs close affected laterally and self-retaining retractor was  placed. Annulotomy is were extended with a 15 blade scalpel large anterior aspect of a 3 minute Kerrison punch was marked degeneration and collapse probably about 56,67 these were all drilled down capturing the bone shavings in a mucous trap at all 3 disc space levels and all endplates were scraped with a BA curette and identify the cartilaginous endplate. [place] illumination first working at [employer] a markedly collapsed and this was further drilled down aggressive abutting both endplates of of the fascia the PLL there was marked spondylosis the posterior aspect vertebral bodies the template at C6 and C7 these were all aggressively under been decompress the central canal marking laterally there was a very large spurs coming off the uncinate process causing stenosis of the C7 root nerve roots bilaterally these were aggressively and bitten away skeletonized the C7 nerve root flush with pedicle. At the discectomy there is no further stenosis either centrally or foraminally this was then packed with Gelfoam to second C5-6 in a similar fashion C5-6 was further drilled down aggressive and viable then placed with a large posterior spur is, predominantly the C5 vertebral body uncinate processes were also again aggressively under been decompress and C6 nerve roots flush with pedicle. At C4-5 this was also decompressed in a similar fashion. After all endplates were prepared bowl 3 disc spaces were copiously irrigated meticulous in a stasis was maintained a size 6 cages packed with local autograft mixed DBX and inserted C4-5 in size 7 cages also packed with local autograft mixed DBX inserted C5-6 and C6-7. Atlantis in place the plate was sized selected and inserted all screws excellent purchase locking mechanisms were engaged posterior fluoroscopy confirmed good position of the implants the wounds and copious irrigated meticulous hemostasis was maintained a J-P drain was placed and the wounds  closed in layers with  after Vicryl in the platysma and a running 4 subcuticular benzoin benzoin Steri-Strips and Dermabond were all applied patient recovered in stable condition.

## 2013-07-14 ENCOUNTER — Encounter (HOSPITAL_COMMUNITY): Payer: Self-pay | Admitting: Neurosurgery

## 2013-07-14 MED ORDER — OXYCODONE HCL 15 MG PO TABS: 15.0000 mg | ORAL_TABLET | ORAL | Status: DC | PRN

## 2013-07-14 NOTE — Progress Notes (Signed)
Utilization review completed. Sparrow [place], RN, BSN. 

## 2013-07-14 NOTE — Evaluation (Signed)
Physical Therapy Evaluation Patient Details Name: Cesar Knight MRN: [id number] DOB: 1955/09/09 Today's Date: 07/14/2013 Time: 4098-1191 PT Time Calculation (min): 12 min  PT Assessment / Plan / Recommendation History of Present Illness  pt presents with C4-7 ACDF.    Clinical Impression  Pt moving great and feels comfortable going home today.  Pt demos independence with all mobility tasks.  No further PT needs at this time.      PT Assessment  Patent does not need any further PT services    Follow Up Recommendations  No PT follow up    Does the patient have the potential to tolerate intense rehabilitation      Barriers to Discharge        Equipment Recommendations  None recommended by PT    Recommendations for Other Services     Frequency      Precautions / Restrictions Precautions Precautions: Cervical Precaution Comments: Gave precautions sheet and reviewed.   Required Braces or Orthoses: Cervical Brace Cervical Brace: Soft collar (with mobility) Restrictions Weight Bearing Restrictions: No   Pertinent Vitals/Pain 4/10 in neck.  Pt indicates premedicated.        Mobility  Bed Mobility Bed Mobility: Not assessed Transfers Transfers: Sit to Stand;Stand to Sit Sit to Stand: 7: Independent;From bed Stand to Sit: 7: Independent;To bed Ambulation/Gait Ambulation/Gait Assistance: 7: Independent Ambulation Distance (Feet): 200 Feet Assistive device: None Gait Pattern: Within Functional Limits Stairs: Yes Stairs Assistance: 4: Min assist Stairs Assistance Details (indicate cue type and reason): cues for use of familymemeber and cane as pt does not have rails at home.   Stair Management Technique: No rails;Step to pattern;Forwards Number of Stairs: 2 Wheelchair Mobility Wheelchair Mobility: No    Exercises     PT Diagnosis:    PT Problem List:   PT Treatment Interventions:       PT Goals(Current goals can be found in the care plan section)    Visit  Information  Last PT Received On: 07/14/13 Assistance Needed: +1 History of Present Illness: pt presents with C4-7 ACDF.         Prior Functioning  Home Living Family/patient expects to be discharged to:: Private residence Living Arrangements: Spouse/significant other;Children Available Help at Discharge: Family;Available 24 hours/day Type of Home: House Home Access: Stairs to enter [employer] of Steps: 2 Entrance Stairs-Rails: None Home Layout: One level Home Equipment: Walker - 2 wheels;Cane - single point;Bedside commode Prior Function Level of Independence: Independent with assistive device(s) Comments: pt indicates he has been using his cane since back surgery 4 months ago.   Communication Communication: No difficulties Dominant Hand: Right    Cognition  Cognition Arousal/Alertness: Awake/alert Behavior During Therapy: WFL for tasks assessed/performed Overall Cognitive Status: Within Functional Limits for tasks assessed    Extremity/Trunk Assessment Upper Extremity Assessment Upper Extremity Assessment: Overall WFL for tasks assessed Lower Extremity Assessment Lower Extremity Assessment: Overall WFL for tasks assessed Cervical / Trunk Assessment Cervical / Trunk Assessment: Normal   Balance Balance Balance Assessed: No  End of Session PT - End of Session Equipment Utilized During Treatment: Gait belt;Cervical collar Activity Tolerance: Patient tolerated treatment well Patient left: in bed;with call bell/phone within reach;with family/visitor present (sitting EOB) Nurse Communication: Mobility status  GP     RitenourAlison Murray, PT [phone] 07/14/2013, 8:20 AM

## 2013-07-14 NOTE — Discharge Summary (Signed)
Physician Discharge Summary  Patient ID: Cesar Knight MRN: [id number] DOB/AGE: 08-11-1955 57 y.o.  Admit date: 07/13/2013 Discharge date: 07/14/2013  Admission Diagnoses: Cervical spondylosis with stenosis C4-5 C5-6 C6-7  Discharge Diagnoses: Same Active Problems:   Spinal stenosis in cervical region   Discharged Condition: good  Hospital Course: Patient admitted hospital underwent an ACDF at C4-5 C5-6 C6-7 postoperatively patient did very well went to the recover and then the floor on the floor was convalescing well ambulating and voiding spontaneously tolerating a regular diet and was stable enough for discharge home. Patient will be scheduled followup approximately 2 weeks  Consults: Significant Diagnostic Studies: Treatments: ACDF C4-5 C5-6 C6-7 Discharge Exam: Blood pressure 135/73, pulse 101, temperature 98.1 F (36.7 C), temperature source Oral, resp. rate 18, height 6\' 2"  (1.88 m), weight 107.049 kg (236 lb), SpO2 94.00%. Strength out of 5 wound clean and dry  Disposition: Home     Medication List    TAKE these medications       ALLEGRA 180 MG tablet  Generic drug:  fexofenadine  Take 180 mg by mouth daily as needed for allergies.     buPROPion 100 MG tablet  Commonly known as:  WELLBUTRIN  Take 100 mg by mouth 2 (two) times daily.     cyclobenzaprine 10 MG tablet  Commonly known as:  FLEXERIL  Take 1 tablet (10 mg total) by mouth 3 (three) times daily as needed for muscle spasms.     fluticasone 50 MCG/ACT nasal spray  Commonly known as:  FLONASE  Place 2 sprays into the nose daily as needed for rhinitis.     hydrochlorothiazide 25 MG tablet  Commonly known as:  HYDRODIURIL  Take 25 mg by mouth daily.     hydrocortisone 25 MG suppository  Commonly known as:  ANUSOL-HC  Place 1 suppository (25 mg total) rectally 2 (two) times daily.     NEXIUM 40 MG capsule  Generic drug:  esomeprazole  Take 40 mg by mouth 2 (two) times daily as needed. For  heartburn     oxyCODONE 15 MG immediate release tablet  Commonly known as:  ROXICODONE  Take 1 tablet (15 mg total) by mouth every 3 (three) hours as needed for severe pain.     oxyCODONE-acetaminophen 7.5-325 MG per tablet  Commonly known as:  PERCOCET  Take 1-2 tablets by mouth every 6 (six) hours as needed. For pain     polyethylene glycol packet  Commonly known as:  MIRALAX / GLYCOLAX  Take 17 g by mouth daily as needed (for daily soft stools).     senna 8.6 MG Tabs tablet  Commonly known as:  SENOKOT  Take 1 tablet by mouth at bedtime as needed for mild constipation.     sildenafil 100 MG tablet  Commonly known as:  VIAGRA  Take 100 mg by mouth daily as needed for erectile dysfunction.     simvastatin 80 MG tablet  Commonly known as:  ZOCOR  Take 40 mg by mouth 2 (two) times daily.     traZODone 100 MG tablet  Commonly known as:  DESYREL  Take 100 mg by mouth at bedtime.     vitamin B-12 1000 MCG tablet  Commonly known as:  CYANOCOBALAMIN  Take 1,000 mcg by mouth daily.      ASK your doctor about these medications       amoxicillin-clavulanate 875-125 MG per tablet  Commonly known as:  AUGMENTIN  Take 1 tablet by mouth 2 (  two) times daily.           Follow-up Information   Follow up with [facility] P, MD.   Specialty:  Neurosurgery   Contact information:   [address] [place] [address] [phone]       Signed: Marysa Wessner P 07/14/2013, 7:06 AM

## 2013-07-14 NOTE — Progress Notes (Signed)
Patient ID: Cesar Knight, male   DOB: 1955/08/08, 57 y.o.   MRN: [id number] Doing well no arm pain neck is sore throat is sore but stable for discharge wound is clean and flat

## 2013-07-19 ENCOUNTER — Emergency Department (HOSPITAL_COMMUNITY)
Admission: EM | Admit: 2013-07-19 | Discharge: 2013-07-19 | Disposition: A | Discharge status: Home / Self Care | Payer: BC Managed Care – PPO | Attending: Emergency Medicine | Admitting: Emergency Medicine

## 2013-07-19 ENCOUNTER — Emergency Department (HOSPITAL_COMMUNITY): Payer: BC Managed Care – PPO

## 2013-07-19 ENCOUNTER — Encounter (HOSPITAL_COMMUNITY): Payer: Self-pay | Admitting: Emergency Medicine

## 2013-07-19 DIAGNOSIS — Z87891 Personal history of nicotine dependence: Secondary | ICD-10-CM | POA: Insufficient documentation

## 2013-07-19 DIAGNOSIS — R05 Cough: Secondary | ICD-10-CM

## 2013-07-19 DIAGNOSIS — F4310 Post-traumatic stress disorder, unspecified: Secondary | ICD-10-CM | POA: Insufficient documentation

## 2013-07-19 DIAGNOSIS — Z9889 Other specified postprocedural states: Secondary | ICD-10-CM | POA: Insufficient documentation

## 2013-07-19 DIAGNOSIS — Z792 Long term (current) use of antibiotics: Secondary | ICD-10-CM | POA: Insufficient documentation

## 2013-07-19 DIAGNOSIS — K219 Gastro-esophageal reflux disease without esophagitis: Secondary | ICD-10-CM | POA: Insufficient documentation

## 2013-07-19 DIAGNOSIS — F329 Major depressive disorder, single episode, unspecified: Secondary | ICD-10-CM | POA: Insufficient documentation

## 2013-07-19 DIAGNOSIS — Z87448 Personal history of other diseases of urinary system: Secondary | ICD-10-CM | POA: Insufficient documentation

## 2013-07-19 DIAGNOSIS — Z79899 Other long term (current) drug therapy: Secondary | ICD-10-CM | POA: Insufficient documentation

## 2013-07-19 DIAGNOSIS — F3289 Other specified depressive episodes: Secondary | ICD-10-CM | POA: Insufficient documentation

## 2013-07-19 DIAGNOSIS — IMO0002 Reserved for concepts with insufficient information to code with codable children: Secondary | ICD-10-CM | POA: Insufficient documentation

## 2013-07-19 DIAGNOSIS — I10 Essential (primary) hypertension: Secondary | ICD-10-CM | POA: Insufficient documentation

## 2013-07-19 DIAGNOSIS — J069 Acute upper respiratory infection, unspecified: Secondary | ICD-10-CM | POA: Insufficient documentation

## 2013-07-19 DIAGNOSIS — M1990 Unspecified osteoarthritis, unspecified site: Secondary | ICD-10-CM | POA: Insufficient documentation

## 2013-07-19 NOTE — ED Notes (Signed)
Pt post cervical surgery on 17th of December.  Pt states that last Friday morning with chest tightness and on Saturday felt like he had increased phlegm.  Pt reports sob.

## 2013-07-19 NOTE — ED Provider Notes (Signed)
CSN: [id number]     Arrival date & time 07/19/13  1132 History   First MD Initiated Contact with Patient 07/19/13 1212     Chief Complaint  Patient presents with  . Chest Pain   (Consider location/radiation/quality/duration/timing/severity/associated sxs/prior Treatment) Patient is a 57 y.o. male presenting with cough.  Cough Cough characteristics:  Productive Sputum characteristics:  Green Severity:  Moderate Onset quality:  Gradual Duration:  1 week Timing:  Constant Progression:  Unchanged Chronicity:  New Context comment:  Recent surgery Relieved by: mucinex. Worsened by:  Nothing tried Associated symptoms: chest pain (right sided, predominantly) and sore throat   Associated symptoms: no fever and no sinus congestion     Past Medical History  Diagnosis Date  . Depression   . GERD (gastroesophageal reflux disease)     per Dr. Russella Dar  . Hypertension   . Allergy   . Headache(784.0)   . Insomnia   . Subdural hematoma 1997  . Erectile dysfunction   . Hemorrhoids   . Low back pain   . Osteoarthritis   . Post traumatic stress disorder (PTSD)     from emotional trauma during serivce in the [employer]  . Complication of anesthesia     difficulty urinating   . Family history of anesthesia complication     Mother reports that it takes a while for her to wake up  . Diverticulitis   . Bipartite patella   . Sleep apnea     years ago in [place]   Past Surgical History  Procedure Laterality Date  . Burr hole for subdural hematoma  1997  . Lumbar laminectomy  2000  . Vasectomy    . Knee arthroscopy  2005    right knee, repeat right knee arthoscopy 01/29/10 per Dr. Dorene Grebe  . Ulnar nerve transposition    . Cataract extraction Right 1985  . Eye surgery    . Colonoscopy    . Anterior cervical decomp/discectomy fusion N/A 07/13/2013    Procedure: ANTERIOR CERVICAL DECOMPRESSION/DISCECTOMY FUSION 3 LEVELS;  Surgeon: Mariam Dollar, MD;  Location: [facility];  Service: Neurosurgery;   Laterality: N/A;  ANTERIOR CERVICAL DECOMPRESSION/DISCECTOMY FUSION 3 LEVELS   Family History  Problem Relation Age of Onset  . Alcohol abuse    . Cancer      colon first degree relative  . Diabetes      first degree relative  . Hyperlipidemia      family history  . Hypertension      family history  . Mental illness      family history  . Sudden death      family history  . Heart disease      family history   History  Substance Use Topics  . Smoking status: Former Smoker -- 0.50 packs/day for 30 years    Types: Cigarettes    Quit date: 09/27/2002  . Smokeless tobacco: Never Used  . Alcohol Use: 2.0 oz/week    4 drink(s) per week     Comment: Socially, reports has not had alcohol for a while    Review of Systems  Constitutional: Negative for fever.  HENT: Positive for sore throat. Negative for congestion.   Respiratory: Positive for cough.   Cardiovascular: Positive for chest pain (right sided, predominantly).  Gastrointestinal: Negative for nausea, vomiting, abdominal pain and diarrhea.  All other systems reviewed and are negative.    Allergies  Avelox; Cefdinir; and Prochlorperazine edisylate  Home Medications   Current Outpatient  Rx  Name  Route  Sig  Dispense  Refill  . amoxicillin-clavulanate (AUGMENTIN) 875-125 MG per tablet   Oral   Take 1 tablet by mouth 2 (two) times daily.   20 tablet   0   . buPROPion (WELLBUTRIN) 100 MG tablet   Oral   Take 100 mg by mouth 2 (two) times daily.         . cyclobenzaprine (FLEXERIL) 10 MG tablet   Oral   Take 1 tablet (10 mg total) by mouth 3 (three) times daily as needed for muscle spasms.   80 tablet   1   . esomeprazole (NEXIUM) 40 MG capsule   Oral   Take 40 mg by mouth 2 (two) times daily as needed. For heartburn         . fexofenadine (ALLEGRA) 180 MG tablet   Oral   Take 180 mg by mouth daily as needed for allergies.          . fluticasone (FLONASE) 50 MCG/ACT nasal spray   Nasal   Place 2  sprays into the nose daily as needed for rhinitis.          . hydrochlorothiazide 25 MG tablet   Oral   Take 25 mg by mouth daily.          . hydrocortisone (ANUSOL-HC) 25 MG suppository   Rectal   Place 1 suppository (25 mg total) rectally 2 (two) times daily.   12 suppository   0   . oxyCODONE (ROXICODONE) 15 MG immediate release tablet   Oral   Take 1 tablet (15 mg total) by mouth every 3 (three) hours as needed for severe pain.   80 tablet   0   . oxyCODONE-acetaminophen (PERCOCET) 7.5-325 MG per tablet   Oral   Take 1-2 tablets by mouth every 6 (six) hours as needed. For pain         . polyethylene glycol (MIRALAX / GLYCOLAX) packet   Oral   Take 17 g by mouth daily as needed (for daily soft stools).         . senna (SENOKOT) 8.6 MG TABS tablet   Oral   Take 1 tablet by mouth at bedtime as needed for mild constipation.         . sildenafil (VIAGRA) 100 MG tablet   Oral   Take 100 mg by mouth daily as needed for erectile dysfunction.         . simvastatin (ZOCOR) 80 MG tablet   Oral   Take 40 mg by mouth 2 (two) times daily.         . traZODone (DESYREL) 100 MG tablet   Oral   Take 100 mg by mouth at bedtime.         . vitamin B-12 (CYANOCOBALAMIN) 1000 MCG tablet   Oral   Take 1,000 mcg by mouth daily.          BP 171/86  Pulse 105  Temp(Src) 98.3 F (36.8 C) (Oral)  Resp 18  SpO2 95% Physical Exam  Nursing note and vitals reviewed. Constitutional: He is oriented to person, place, and time. He appears well-developed and well-nourished. No distress.  HENT:  Head: Normocephalic and atraumatic.  Mouth/Throat: Oropharynx is clear and moist.  Eyes: Conjunctivae are normal. Pupils are equal, round, and reactive to light. No scleral icterus.  Neck: Neck supple.  Surgical steri strips are c/d/i.  No neck swelling.  Minimal tenderness.    Cardiovascular: Normal rate,  regular rhythm, normal heart sounds and intact distal pulses.   No murmur  heard. Pulmonary/Chest: Effort normal and breath sounds normal. No stridor. No respiratory distress. He has no wheezes. He has no rales.  Abdominal: Soft. He exhibits no distension. There is no tenderness.  Musculoskeletal: Normal range of motion. He exhibits no edema.  Neurological: He is alert and oriented to person, place, and time.  Skin: Skin is warm and dry. No rash noted.  Psychiatric: He has a normal mood and affect. His behavior is normal.    ED Course  Procedures (including critical care time) Labs Review Labs Reviewed - No data to display Imaging Review Dg Chest 2 View  07/19/2013   CLINICAL DATA:  Chest pain, history hypertension  EXAM: CHEST  2 VIEW  COMPARISON:  03/24/2013  FINDINGS: Upper-normal size of cardiac silhouette.  Mediastinal contours and pulmonary vascularity normal.  Lungs clear.  No pleural effusion or pneumothorax.  Prior cervical spine fusion.  Old right rib fractures.  IMPRESSION: No acute abnormalities.   Electronically Signed   By: Ulyses Southward M.D.   On: 07/19/2013 12:47  All radiology studies independently viewed by me.     EKG Interpretation    Date/Time:  Tuesday July 19 2013 11:41:26 EST Ventricular Rate:  109 PR Interval:  122 QRS Duration: 86 QT Interval:  336 QTC Calculation: 452 R Axis:   8 Text Interpretation:  Sinus tachycardia SINCE LAST TRACING HEART RATE HAS INCREASED Confirmed by [facility]  MD, TREY ([id number]) on 07/19/2013 12:25:32 PM            MDM   1. Acute URI   2. Cough    57 yo male with recent cervical spine surgery about 1 week ago presenting with cough productive of green sputum.  Reports some chest "congestion" and mild SOB.  He is well appearing, not distressed.  Lungs are clear and CXR is clear.  His exam and imaging are not concerning for aspiration pneumonia at this time.  Advised supportive treatment and monitoring for signs of pneumonia such as fevers and worsening of his symptoms.    He also complains of some  difficulty swallowing last week, which resolved.  Likely an expected sequelae of his surgery.  He can swallow fine now.     Candyce Churn, MD 07/19/13 [phone]

## 2013-09-19 ENCOUNTER — Ambulatory Visit (INDEPENDENT_AMBULATORY_CARE_PROVIDER_SITE_OTHER): Payer: BC Managed Care – PPO | Admitting: Family Medicine

## 2013-09-19 ENCOUNTER — Encounter: Payer: Self-pay | Admitting: Family Medicine

## 2013-09-19 VITALS — BP 140/84 | HR 117 | Temp 97.8°F | Ht 74.0 in | Wt 248.0 lb

## 2013-09-19 DIAGNOSIS — I10 Essential (primary) hypertension: Secondary | ICD-10-CM

## 2013-09-19 DIAGNOSIS — R609 Edema, unspecified: Secondary | ICD-10-CM

## 2013-09-19 DIAGNOSIS — R600 Localized edema: Secondary | ICD-10-CM

## 2013-09-19 LAB — HEPATIC FUNCTION PANEL
ALK PHOS: 74 U/L (ref 39–117)
ALT: 45 U/L (ref 0–53)
AST: 30 U/L (ref 0–37)
Albumin: 4 g/dL (ref 3.5–5.2)
BILIRUBIN DIRECT: 0 mg/dL (ref 0.0–0.3)
TOTAL PROTEIN: 6.8 g/dL (ref 6.0–8.3)
Total Bilirubin: 0.6 mg/dL (ref 0.3–1.2)

## 2013-09-19 LAB — BASIC METABOLIC PANEL
BUN: 10 mg/dL (ref 6–23)
CHLORIDE: 98 meq/L (ref 96–112)
CO2: 31 meq/L (ref 19–32)
Calcium: 10.3 mg/dL (ref 8.4–10.5)
Creatinine, Ser: 1 mg/dL (ref 0.4–1.5)
GFR: 102 mL/min (ref >60.00)
Glucose, Bld: 122 mg/dL — ABNORMAL HIGH (ref 70–99)
Potassium: 4.2 mEq/L (ref 3.5–5.1)
SODIUM: 136 meq/L (ref 135–145)

## 2013-09-19 MED ORDER — OXYCODONE HCL 15 MG PO TABS: 15.0000 mg | ORAL_TABLET | Freq: Four times a day (QID) | ORAL | Status: DC | PRN

## 2013-09-19 MED ORDER — FUROSEMIDE 40 MG PO TABS
40.0000 mg | ORAL_TABLET | Freq: Every day | ORAL | Status: DC
Start: 2013-09-19 — End: 2014-02-21

## 2013-09-19 NOTE — Progress Notes (Signed)
   Subjective:    Patient ID: Cesar Knight, male    DOB: 09/07/1955, 58 y.o.   MRN: [id number]  HPI Here for swelling in the lower legs and feet for the past few weeks. No chest pain or SOB. He recently had cervical spine surgery and this went well. He had a normal EKG preoperatively.    Review of Systems  Constitutional: Negative.   Respiratory: Negative.   Cardiovascular: Positive for leg swelling. Negative for chest pain and palpitations.       Objective:   Physical Exam  Constitutional: He appears well-developed and well-nourished.  Cardiovascular: Normal rate, regular rhythm, normal heart sounds and intact distal pulses.   Pulmonary/Chest: Effort normal and breath sounds normal.  Musculoskeletal:  1+ edema in both lower legs           Assessment & Plan:  His fluid retention is probably due to inactivity after his recent surgery and due to steroid use prior to the surgery. We will get a BMET today and set up an ECHO soon.  Try Lasix 40 mg daily.

## 2013-09-19 NOTE — Progress Notes (Signed)
Pre visit review using our clinic review tool, if applicable. No additional management support is needed unless otherwise documented below in the visit note. 

## 2013-09-20 ENCOUNTER — Telehealth: Payer: Self-pay | Admitting: Family Medicine

## 2013-09-20 NOTE — Telephone Encounter (Signed)
Relevant patient education assigned to patient using Emmi. ° °

## 2013-10-13 ENCOUNTER — Encounter: Payer: Self-pay | Admitting: Cardiology

## 2013-10-13 ENCOUNTER — Ambulatory Visit (HOSPITAL_COMMUNITY): Payer: BC Managed Care – PPO | Attending: Cardiology | Admitting: Radiology

## 2013-10-13 DIAGNOSIS — I359 Nonrheumatic aortic valve disorder, unspecified: Secondary | ICD-10-CM | POA: Insufficient documentation

## 2013-10-13 DIAGNOSIS — I10 Essential (primary) hypertension: Secondary | ICD-10-CM | POA: Insufficient documentation

## 2013-10-13 DIAGNOSIS — E669 Obesity, unspecified: Secondary | ICD-10-CM | POA: Insufficient documentation

## 2013-10-13 DIAGNOSIS — Z8249 Family history of ischemic heart disease and other diseases of the circulatory system: Secondary | ICD-10-CM | POA: Insufficient documentation

## 2013-10-13 DIAGNOSIS — R600 Localized edema: Secondary | ICD-10-CM

## 2013-10-13 DIAGNOSIS — R609 Edema, unspecified: Secondary | ICD-10-CM

## 2013-10-13 DIAGNOSIS — Z6831 Body mass index (BMI) 31.0-31.9, adult: Secondary | ICD-10-CM | POA: Insufficient documentation

## 2013-10-13 DIAGNOSIS — R0989 Other specified symptoms and signs involving the circulatory and respiratory systems: Secondary | ICD-10-CM | POA: Insufficient documentation

## 2013-10-13 DIAGNOSIS — R0602 Shortness of breath: Secondary | ICD-10-CM

## 2013-10-13 DIAGNOSIS — R0609 Other forms of dyspnea: Secondary | ICD-10-CM | POA: Insufficient documentation

## 2013-10-13 NOTE — Progress Notes (Signed)
Echocardiogram performed.  

## 2013-10-14 ENCOUNTER — Telehealth: Payer: Self-pay | Admitting: Family Medicine

## 2013-10-14 NOTE — Telephone Encounter (Signed)
Normal, see the report

## 2013-10-14 NOTE — Telephone Encounter (Signed)
Result was released to MyChart.

## 2013-10-14 NOTE — Telephone Encounter (Signed)
Pt had echocardiogram done at  yesterday and would like results

## 2013-11-21 ENCOUNTER — Ambulatory Visit (INDEPENDENT_AMBULATORY_CARE_PROVIDER_SITE_OTHER): Payer: BC Managed Care – PPO | Admitting: Family Medicine

## 2013-11-21 ENCOUNTER — Encounter: Payer: Self-pay | Admitting: Family Medicine

## 2013-11-21 VITALS — BP 140/83 | HR 108 | Temp 98.8°F | Ht 74.0 in | Wt 240.0 lb

## 2013-11-21 DIAGNOSIS — R42 Dizziness and giddiness: Secondary | ICD-10-CM

## 2013-11-21 MED ORDER — MECLIZINE HCL 25 MG PO TABS: 25.0000 mg | ORAL_TABLET | ORAL | Status: DC | PRN

## 2013-11-21 NOTE — Progress Notes (Signed)
   Subjective:    Patient ID: Cesar Knight, male    DOB: April 12, 1956, 58 y.o.   MRN: [id number]  HPI Here for the sudden onset yesterday morning of dizziness. When he got out of bed he felt as if the room was spinning around him. This made it difficult to walk. This became worse last night and his BP went up to 190/105. No HA or chest pain or SOB. He felt nauseated but did not vomit. They called EMS last night and he checked out okay. His EKG was normal. Today he feels better and his BP is down, but he is still dizzy when he stand up or moves his head side to side.    Review of Systems  Constitutional: Negative.   Respiratory: Negative.   Cardiovascular: Negative.   Neurological: Positive for dizziness. Negative for tremors, seizures, syncope, facial asymmetry, speech difficulty, weakness, light-headedness, numbness and headaches.       Objective:   Physical Exam  Constitutional: He is oriented to person, place, and time. He appears well-developed and well-nourished.  HENT:  Head: Normocephalic and atraumatic.  Eyes: Conjunctivae and EOM are normal. Pupils are equal, round, and reactive to light.  Neck: Neck supple. No thyromegaly present.  Cardiovascular: Normal rate, regular rhythm, normal heart sounds and intact distal pulses.   Pulmonary/Chest: Effort normal and breath sounds normal.  Lymphadenopathy:    He has no cervical adenopathy.  Neurological: He is alert and oriented to person, place, and time. He has normal reflexes. No cranial nerve deficit. He exhibits normal muscle tone. Coordination normal.  He is mildly unsteady when walking without assistance. His wife holds his hand          Assessment & Plan:  New onset vertigo. He will try Meclizine prn, drink fluids and rest. Recheck prn

## 2013-12-15 ENCOUNTER — Telehealth: Payer: Self-pay | Admitting: Family Medicine

## 2013-12-15 MED ORDER — OXYCODONE HCL 15 MG PO TABS: 15.0000 mg | ORAL_TABLET | Freq: Four times a day (QID) | ORAL | Status: DC | PRN

## 2013-12-15 NOTE — Telephone Encounter (Signed)
done

## 2013-12-15 NOTE — Telephone Encounter (Signed)
Pt needs new rx oxycodone °

## 2013-12-16 NOTE — Telephone Encounter (Signed)
Script is ready for pick up and I left a voice message for pt. 

## 2014-02-02 ENCOUNTER — Encounter: Payer: Self-pay | Admitting: *Deleted

## 2014-02-03 ENCOUNTER — Other Ambulatory Visit: Payer: BC Managed Care – PPO

## 2014-02-15 ENCOUNTER — Telehealth: Payer: Self-pay | Admitting: Family Medicine

## 2014-02-15 NOTE — Telephone Encounter (Signed)
Pt request refill  °oxyCODONE (ROXICODONE) 15 MG immediate release tablet °

## 2014-02-16 MED ORDER — OXYCODONE HCL 15 MG PO TABS: 15.0000 mg | ORAL_TABLET | Freq: Four times a day (QID) | ORAL | Status: DC | PRN

## 2014-02-16 NOTE — Telephone Encounter (Signed)
done

## 2014-02-16 NOTE — Telephone Encounter (Signed)
Script is ready for pick up and I left a voice message for pt. 

## 2014-02-20 ENCOUNTER — Other Ambulatory Visit (INDEPENDENT_AMBULATORY_CARE_PROVIDER_SITE_OTHER): Payer: BC Managed Care – PPO

## 2014-02-20 DIAGNOSIS — Z0000 Encounter for general adult medical examination without abnormal findings: Secondary | ICD-10-CM

## 2014-02-20 LAB — TSH: TSH: 3.09 u[IU]/mL (ref 0.35–4.50)

## 2014-02-20 LAB — POCT URINALYSIS DIPSTICK
BILIRUBIN UA: NEGATIVE
Blood, UA: NEGATIVE
GLUCOSE UA: NEGATIVE
KETONES UA: NEGATIVE
Leukocytes, UA: NEGATIVE
Nitrite, UA: NEGATIVE
SPEC GRAV UA: 1.02
Urobilinogen, UA: 0.2
pH, UA: 7

## 2014-02-20 LAB — LIPID PANEL
Cholesterol: 236 mg/dL — ABNORMAL HIGH (ref 0–200)
HDL: 39.4 mg/dL (ref >39.00)
LDL CALC: 165 mg/dL — AB (ref 0–99)
NonHDL: 196.6
Total CHOL/HDL Ratio: 6
Triglycerides: 157 mg/dL — ABNORMAL HIGH (ref 0.0–149.0)
VLDL: 31.4 mg/dL (ref 0.0–40.0)

## 2014-02-20 LAB — CBC WITH DIFFERENTIAL/PLATELET
BASOS ABS: 0 10*3/uL (ref 0.0–0.1)
Basophils Relative: 0.5 % (ref 0.0–3.0)
Eosinophils Absolute: 0.1 10*3/uL (ref 0.0–0.7)
Eosinophils Relative: 3 % (ref 0.0–5.0)
HCT: 43.1 % (ref 39.0–52.0)
Hemoglobin: 14.3 g/dL (ref 13.0–17.0)
LYMPHS PCT: 54 % — AB (ref 12.0–46.0)
Lymphs Abs: 2.7 10*3/uL (ref 0.7–4.0)
MCHC: 33.2 g/dL (ref 30.0–36.0)
MCV: 90.8 fl (ref 78.0–100.0)
Monocytes Absolute: 0.4 10*3/uL (ref 0.1–1.0)
Monocytes Relative: 8.2 % (ref 3.0–12.0)
NEUTROS ABS: 1.7 10*3/uL (ref 1.4–7.7)
NEUTROS PCT: 34.3 % — AB (ref 43.0–77.0)
Platelets: 191 10*3/uL (ref 150.0–400.0)
RBC: 4.75 Mil/uL (ref 4.22–5.81)
RDW: 14.7 % (ref 11.5–15.5)
WBC: 5 10*3/uL (ref 4.0–10.5)

## 2014-02-20 LAB — HEPATIC FUNCTION PANEL
ALT: 37 U/L (ref 0–53)
AST: 28 U/L (ref 0–37)
Albumin: 4 g/dL (ref 3.5–5.2)
Alkaline Phosphatase: 67 U/L (ref 39–117)
BILIRUBIN DIRECT: 0.1 mg/dL (ref 0.0–0.3)
Total Bilirubin: 0.7 mg/dL (ref 0.2–1.2)
Total Protein: 6.7 g/dL (ref 6.0–8.3)

## 2014-02-20 LAB — BASIC METABOLIC PANEL
BUN: 14 mg/dL (ref 6–23)
CHLORIDE: 100 meq/L (ref 96–112)
CO2: 32 mEq/L (ref 19–32)
CREATININE: 1.1 mg/dL (ref 0.4–1.5)
Calcium: 9.7 mg/dL (ref 8.4–10.5)
GFR: 92.95 mL/min (ref >60.00)
Glucose, Bld: 97 mg/dL (ref 70–99)
POTASSIUM: 4.9 meq/L (ref 3.5–5.1)
Sodium: 138 mEq/L (ref 135–145)

## 2014-02-20 LAB — PSA: PSA: 0.55 ng/mL (ref 0.10–4.00)

## 2014-02-21 ENCOUNTER — Ambulatory Visit (INDEPENDENT_AMBULATORY_CARE_PROVIDER_SITE_OTHER): Payer: BC Managed Care – PPO | Admitting: Family Medicine

## 2014-02-21 ENCOUNTER — Encounter: Payer: Self-pay | Admitting: Family Medicine

## 2014-02-21 VITALS — BP 144/91 | HR 98 | Temp 98.3°F | Ht 74.0 in | Wt 240.0 lb

## 2014-02-21 DIAGNOSIS — Z0000 Encounter for general adult medical examination without abnormal findings: Secondary | ICD-10-CM

## 2014-02-21 MED ORDER — HYDROCHLOROTHIAZIDE 25 MG PO TABS: 25.0000 mg | ORAL_TABLET | Freq: Every day | ORAL | Status: AC

## 2014-02-21 MED ORDER — PREGABALIN 100 MG PO CAPS: 100.0000 mg | ORAL_CAPSULE | Freq: Two times a day (BID) | ORAL | Status: DC

## 2014-02-21 NOTE — Progress Notes (Signed)
   Subjective:    Patient ID: Cesar LenisPerry W Coakley Sr., male    DOB: 1956-04-08, 58 y.o.   MRN: [id number]  HPI 58 yr old male for a cpx. He has a few items to discuss. He had several spinal surgeries in the past year and his pain levels are definitely improved, however he still has constant burning type pains that run down his arms or down his legs. These wax and wane. He tried Gabapentin prior to the surgeries but had to stop it due to sedation side effects. He asks if he can switch from lasix back to HCTZ because he has to urinate so often. His BP is stable. He [facility].    Review of Systems  Constitutional: Negative.   HENT: Negative.   Eyes: Negative.   Respiratory: Negative.   Cardiovascular: Negative.   Gastrointestinal: Negative.   Genitourinary: Negative.   Musculoskeletal: Negative.   Skin: Negative.   Neurological: Negative.   Psychiatric/Behavioral: Negative.        Objective:   Physical Exam  Constitutional: He is oriented to person, place, and time. He appears well-developed and well-nourished. No distress.  HENT:  Head: Normocephalic and atraumatic.  Right Ear: External ear normal.  Left Ear: External ear normal.  Nose: Nose normal.  Mouth/Throat: Oropharynx is clear and moist. No oropharyngeal exudate.  Eyes: Conjunctivae and EOM are normal. Pupils are equal, round, and reactive to light. Right eye exhibits no discharge. Left eye exhibits no discharge. No scleral icterus.  Neck: Neck supple. No JVD present. No tracheal deviation present. No thyromegaly present.  Cardiovascular: Normal rate, regular rhythm, normal heart sounds and intact distal pulses.  Exam reveals no gallop and no friction rub.   No murmur heard. Pulmonary/Chest: Effort normal and breath sounds normal. No respiratory distress. He has no wheezes. He has no rales. He exhibits no tenderness.  Abdominal: Soft. Bowel sounds are normal. He exhibits no distension and no  mass. There is no tenderness. There is no rebound and no guarding.  Genitourinary: Rectum normal, prostate normal and penis normal. Guaiac negative stool. No penile tenderness.  Musculoskeletal: Normal range of motion. He exhibits no edema and no tenderness.  Lymphadenopathy:    He has no cervical adenopathy.  Neurological: He is alert and oriented to person, place, and time. He has normal reflexes. No cranial nerve deficit. He exhibits normal muscle tone. Coordination normal.  Skin: Skin is warm and dry. No rash noted. He is not diaphoretic. No erythema. No pallor.  Psychiatric: He has a normal mood and affect. His behavior is normal. Judgment and thought content normal.          Assessment & Plan:  Well exam. Try Lyrica 100 mg bid. Switch from lasix to HCTZ 25 mg daily. I urged him to set up a colonoscopy since he is a year past due. He has been prescribed some Lipitor for his cholesterol but he has not started it yet. I urged him to do so.

## 2014-02-21 NOTE — Progress Notes (Signed)
Pre visit review using our clinic review tool, if applicable. No additional management support is needed unless otherwise documented below in the visit note. 

## 2014-03-01 ENCOUNTER — Encounter: Payer: Self-pay | Admitting: Family Medicine

## 2014-03-02 ENCOUNTER — Encounter: Payer: Self-pay | Admitting: Family Medicine

## 2014-03-03 MED ORDER — ATORVASTATIN CALCIUM 40 MG PO TABS: 40.0000 mg | ORAL_TABLET | Freq: Every day | ORAL | Status: AC

## 2014-03-03 NOTE — Telephone Encounter (Signed)
I sent script e-scribe and spoke with pt. 

## 2014-03-03 NOTE — Telephone Encounter (Signed)
We no longer use an 80 mg dose of Simvastatin due to side effects. We will change him to 40 mg of Lipitor daily. Call in one year supply

## 2014-03-03 NOTE — Telephone Encounter (Signed)
Duplicate note

## 2014-03-16 ENCOUNTER — Encounter: Payer: Self-pay | Admitting: Family Medicine

## 2014-03-17 ENCOUNTER — Other Ambulatory Visit: Payer: Self-pay | Admitting: Family Medicine

## 2014-03-17 MED ORDER — OXYCODONE HCL 15 MG PO TABS: 15.0000 mg | ORAL_TABLET | Freq: Four times a day (QID) | ORAL | Status: DC | PRN

## 2014-03-17 MED ORDER — PREGABALIN 100 MG PO CAPS: 100.0000 mg | ORAL_CAPSULE | Freq: Two times a day (BID) | ORAL | Status: DC

## 2014-03-17 NOTE — Telephone Encounter (Signed)
done

## 2014-04-26 ENCOUNTER — Encounter: Payer: Self-pay | Admitting: Family Medicine

## 2014-04-27 MED ORDER — OXYCODONE HCL 15 MG PO TABS: 15.0000 mg | ORAL_TABLET | Freq: Four times a day (QID) | ORAL | Status: DC | PRN

## 2014-04-27 NOTE — Telephone Encounter (Signed)
done

## 2014-05-03 ENCOUNTER — Encounter: Payer: Self-pay | Admitting: Gastroenterology

## 2014-06-02 ENCOUNTER — Other Ambulatory Visit: Payer: Self-pay | Admitting: Physician Assistant

## 2014-06-02 DIAGNOSIS — M545 Low back pain: Secondary | ICD-10-CM

## 2014-06-02 DIAGNOSIS — M542 Cervicalgia: Secondary | ICD-10-CM

## 2014-06-03 ENCOUNTER — Telehealth: Payer: Self-pay | Admitting: Family Medicine

## 2014-06-06 MED ORDER — OXYCODONE HCL 15 MG PO TABS: 15.0000 mg | ORAL_TABLET | Freq: Four times a day (QID) | ORAL | Status: DC | PRN

## 2014-06-06 NOTE — Telephone Encounter (Signed)
I sent pt a my chart message to let him know that script is ready for pick up here at office.

## 2014-06-06 NOTE — Telephone Encounter (Signed)
done

## 2014-06-11 ENCOUNTER — Ambulatory Visit
Admission: RE | Admit: 2014-06-11 | Discharge: 2014-06-11 | Disposition: A | Discharge status: Home / Self Care | Payer: BC Managed Care – PPO | Source: Ambulatory Visit | Attending: Physician Assistant | Admitting: Physician Assistant

## 2014-06-11 DIAGNOSIS — M542 Cervicalgia: Secondary | ICD-10-CM

## 2014-06-11 DIAGNOSIS — M545 Low back pain: Secondary | ICD-10-CM

## 2014-06-11 MED ORDER — GADOBENATE DIMEGLUMINE 529 MG/ML IV SOLN
20.0000 mL | Freq: Once | INTRAVENOUS | Status: AC | PRN
  Administered 2014-06-11: 20 mL via INTRAVENOUS

## 2014-06-30 ENCOUNTER — Ambulatory Visit (INDEPENDENT_AMBULATORY_CARE_PROVIDER_SITE_OTHER): Payer: BC Managed Care – PPO | Admitting: Family Medicine

## 2014-06-30 ENCOUNTER — Encounter: Payer: Self-pay | Admitting: Family Medicine

## 2014-06-30 VITALS — BP 160/90 | Temp 98.3°F | Wt 242.0 lb

## 2014-06-30 DIAGNOSIS — J0100 Acute maxillary sinusitis, unspecified: Secondary | ICD-10-CM

## 2014-06-30 MED ORDER — OXYCODONE HCL 15 MG PO TABS: 15.0000 mg | ORAL_TABLET | Freq: Four times a day (QID) | ORAL | Status: DC | PRN

## 2014-06-30 MED ORDER — CYCLOBENZAPRINE HCL 10 MG PO TABS: 10.0000 mg | ORAL_TABLET | Freq: Three times a day (TID) | ORAL | Status: AC | PRN

## 2014-06-30 MED ORDER — AZITHROMYCIN 250 MG PO TABS: ORAL_TABLET | ORAL | Status: DC

## 2014-06-30 NOTE — Progress Notes (Signed)
   Subjective:    Patient ID: Arnoldo LenisPerry W Valli Sr., male    DOB: [birth year], 58 y.o.   MRN: [id number]  HPI Here for 5 days of sinus pressure, PND, ear pain and ST. No cough or fever.    Review of Systems  Constitutional: Negative.   HENT: Positive for congestion, postnasal drip and sinus pressure.   Eyes: Negative.   Respiratory: Negative.        Objective:   Physical Exam  Constitutional: He appears well-developed and well-nourished.  HENT:  Right Ear: External ear normal.  Left Ear: External ear normal.  Nose: Nose normal.  Mouth/Throat: Oropharynx is clear and moist.  Eyes: Conjunctivae are normal.  Pulmonary/Chest: Effort normal and breath sounds normal.  Lymphadenopathy:    He has no cervical adenopathy.          Assessment & Plan:  Add Mucinex

## 2014-06-30 NOTE — Progress Notes (Signed)
Pre visit review using our clinic review tool, if applicable. No additional management support is needed unless otherwise documented below in the visit note. 

## 2014-07-31 ENCOUNTER — Encounter: Payer: Self-pay | Admitting: Family Medicine

## 2014-07-31 MED ORDER — OXYCODONE HCL 15 MG PO TABS: 15.0000 mg | ORAL_TABLET | Freq: Four times a day (QID) | ORAL | Status: DC | PRN

## 2014-07-31 NOTE — Telephone Encounter (Signed)
done

## 2014-08-03 ENCOUNTER — Encounter: Payer: Self-pay | Admitting: Family Medicine

## 2014-08-03 ENCOUNTER — Encounter: Payer: Self-pay | Admitting: Gastroenterology

## 2014-08-03 NOTE — Telephone Encounter (Signed)
Yes there are alternative meds. Make an OV to discuss these

## 2014-08-07 ENCOUNTER — Ambulatory Visit (INDEPENDENT_AMBULATORY_CARE_PROVIDER_SITE_OTHER): Payer: BLUE CROSS/BLUE SHIELD | Admitting: Family Medicine

## 2014-08-07 ENCOUNTER — Encounter: Payer: Self-pay | Admitting: Family Medicine

## 2014-08-07 VITALS — BP 150/93 | HR 85 | Temp 98.3°F | Ht 74.0 in | Wt 243.0 lb

## 2014-08-07 DIAGNOSIS — M5441 Lumbago with sciatica, right side: Secondary | ICD-10-CM

## 2014-08-07 DIAGNOSIS — M150 Primary generalized (osteo)arthritis: Secondary | ICD-10-CM

## 2014-08-07 DIAGNOSIS — M5442 Lumbago with sciatica, left side: Secondary | ICD-10-CM

## 2014-08-07 DIAGNOSIS — M159 Polyosteoarthritis, unspecified: Secondary | ICD-10-CM

## 2014-08-07 MED ORDER — HYDROMORPHONE HCL 4 MG PO TABS: 4.0000 mg | ORAL_TABLET | Freq: Four times a day (QID) | ORAL | Status: DC | PRN

## 2014-08-07 NOTE — Progress Notes (Signed)
   Subjective:    Patient ID: Cesar LenisPerry W Nicolaisen Sr., male    DOB: [birth year], 59 y.o.   MRN: [id number]  HPI Here to discuss pain management. He has been using oxycodone and this works better than hydrocodone, but both of them make him itch. Benadryl helps but he does not want tot take this all the time.    Review of Systems  Constitutional: Negative.   Musculoskeletal: Positive for back pain and arthralgias.       Objective:   Physical Exam  Constitutional: He appears well-developed and well-nourished.          Assessment & Plan:  Switch to Dilaudid to use prn pain. Get back into yoga, since this helped before.

## 2014-08-07 NOTE — Progress Notes (Signed)
Pre visit review using our clinic review tool, if applicable. No additional management support is needed unless otherwise documented below in the visit note. 

## 2014-08-08 NOTE — Telephone Encounter (Signed)
done

## 2014-08-31 ENCOUNTER — Ambulatory Visit (AMBULATORY_SURGERY_CENTER): Payer: Non-veteran care | Admitting: *Deleted

## 2014-08-31 VITALS — Ht 74.0 in | Wt 240.6 lb

## 2014-08-31 DIAGNOSIS — Z800 Family history of malignant neoplasm of digestive organs: Secondary | ICD-10-CM

## 2014-08-31 MED ORDER — MOVIPREP 100 G PO SOLR: 1.0000 | Freq: Once | ORAL | Status: DC

## 2014-08-31 NOTE — Progress Notes (Signed)
No egg or soy allergy No home 02 use No diet pills No issues with past sedation except difficulty urinating, no issues with intubation Pt declined emmi

## 2014-09-06 ENCOUNTER — Ambulatory Visit (AMBULATORY_SURGERY_CENTER): Payer: BLUE CROSS/BLUE SHIELD | Admitting: Gastroenterology

## 2014-09-06 ENCOUNTER — Encounter: Payer: Self-pay | Admitting: Gastroenterology

## 2014-09-06 VITALS — BP 159/88 | HR 82 | Temp 95.9°F | Resp 16 | Ht 74.0 in | Wt 240.0 lb

## 2014-09-06 DIAGNOSIS — Z8371 Family history of colonic polyps: Secondary | ICD-10-CM

## 2014-09-06 DIAGNOSIS — Z1211 Encounter for screening for malignant neoplasm of colon: Secondary | ICD-10-CM

## 2014-09-06 DIAGNOSIS — Z800 Family history of malignant neoplasm of digestive organs: Secondary | ICD-10-CM

## 2014-09-06 HISTORY — PX: COLONOSCOPY: SHX174

## 2014-09-06 MED ORDER — SODIUM CHLORIDE 0.9 % IV SOLN: 500.0000 mL | INTRAVENOUS | Status: DC

## 2014-09-06 NOTE — Progress Notes (Signed)
Stable to RR 

## 2014-09-06 NOTE — Op Note (Signed)
 [facility] [address] [place] [id number]   COLONOSCOPY PROCEDURE REPORT  PATIENT: Cesar Knight, Cesar Knight  MR#: [id number] BIRTHDATE: November 22, 1955 , 58  yrs. old GENDER: male ENDOSCOPIST: Meryl DareMalcolm T Undine Nealis, MD, [facility] PROCEDURE DATE:  09/06/2014 PROCEDURE:   Colonoscopy, screening First Screening Colonoscopy - Avg.  risk and is 50 yrs.  old or older - No.  Prior Negative Screening - Now for repeat screening. Less than 10 yrs Prior Negative Screening - Now for repeat screening.  Above average risk  History of Adenoma - Now for follow-up colonoscopy & has been > or = to 3 yrs.  N/A  Polyps Removed Today? No.  Polyps Removed Today? No.  Recommend repeat exam, <10 yrs? Polyps Removed Today? No.  Recommend repeat exam, <10 yrs? Yes.  Polyps Removed Today? No.  Recommend repeat exam, <10 yrs? Yes.  High risk (family or personal hx). ASA CLASS:   Class II INDICATIONS:patient's family history of colon cancer, distant relatives and patient's family history of colon polyps. MEDICATIONS: Monitored anesthesia care, Propofol 400 mg IV, and lidocaine 40 mg IV DESCRIPTION OF PROCEDURE:   After the risks benefits and alternatives of the procedure were thoroughly explained, informed consent was obtained.  The digital rectal exam revealed no abnormalities of the rectum.   The LB [id number]  endoscope was introduced through the anus and advanced to the cecum, which was identified by both the appendix and ileocecal valve. No adverse events experienced with a tortuous colon.   The quality of the prep was good, using MoviPrep  The instrument was then slowly withdrawn as the colon was fully examined.    COLON FINDINGS: There was mild diverticulosis noted in the ascending colon.   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   The examination was otherwise normal.  Retroflexed views revealed internal Grade I hemorrhoids. The time to cecum=4 minutes 09 seconds.  Withdrawal time=8  minutes 15 seconds.  The scope was withdrawn and the procedure completed. COMPLICATIONS: There were no immediate complications.  ENDOSCOPIC IMPRESSION: 1.   Mild diverticulosis in the ascending colon 2.   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   Grade l internal hemorrhoids  RECOMMENDATIONS: 1.  High fiber diet with liberal fluid intake. 2.  Repeat Colonoscopy in 5 years.  eSigned:  Meryl DareMalcolm T Cyara Devoto, MD, [facility] 09/06/2014 9:03 AM

## 2014-09-06 NOTE — Patient Instructions (Signed)
Diverticulosis and Hemorrhoids seen today. Try to follow high fiber diet with liberal fluid intake.  Handouts given on diverticulosis, hemorrhoids and high fiber diet. Repeat colonoscopy in 5 years. Thank you!   YOU HAD AN ENDOSCOPIC PROCEDURE TODAY AT THE [facility]: Refer to the procedure report that was given to you for any specific questions about what was found during the examination.  If the procedure report does not answer your questions, please call your gastroenterologist to clarify.  If you requested that your care partner not be given the details of your procedure findings, then the procedure report has been included in a sealed envelope for you to review at your convenience later.  YOU SHOULD EXPECT: Some feelings of bloating in the abdomen. Passage of more gas than usual.  Walking can help get rid of the air that was put into your GI tract during the procedure and reduce the bloating. If you had a lower endoscopy (such as a colonoscopy or flexible sigmoidoscopy) you may notice spotting of blood in your stool or on the toilet paper. If you underwent a bowel prep for your procedure, then you may not have a normal bowel movement for a few days.  DIET: Your first meal following the procedure should be a light meal and then it is ok to progress to your normal diet.  A half-sandwich or bowl of soup is an example of a good first meal.  Heavy or fried foods are harder to digest and may make you feel nauseous or bloated.  Likewise meals heavy in dairy and vegetables can cause extra gas to form and this can also increase the bloating.  Drink plenty of fluids but you should avoid alcoholic beverages for 24 hours.  ACTIVITY: Your care partner should take you home directly after the procedure.  You should plan to take it easy, moving slowly for the rest of the day.  You can resume normal activity the day after the procedure however you should NOT DRIVE or use heavy machinery for 24 hours  (because of the sedation medicines used during the test).    SYMPTOMS TO REPORT IMMEDIATELY: A gastroenterologist can be reached at any hour.  During normal business hours, 8:30 AM to 5:00 PM Monday through Friday, call [phone].  After hours and on weekends, please call the GI answering service at [phone] who will take a message and have the physician on call contact you.   Following lower endoscopy (colonoscopy or flexible sigmoidoscopy):  Excessive amounts of blood in the stool  Significant tenderness or worsening of abdominal pains  Swelling of the abdomen that is new, acute  Fever of 100F or higher  Following upper endoscopy (EGD)  Vomiting of blood or coffee ground material  New chest pain or pain under the shoulder blades  Painful or persistently difficult swallowing  New shortness of breath  Fever of 100F or higher  Black, tarry-looking stools  FOLLOW UP: If any biopsies were taken you will be contacted by phone or by letter within the next 1-3 weeks.  Call your gastroenterologist if you have not heard about the biopsies in 3 weeks.  Our staff will call the home number listed on your records the next business day following your procedure to check on you and address any questions or concerns that you may have at that time regarding the information given to you following your procedure. This is a courtesy call and so if there is no answer at the home  number and we have not heard from you through the emergency physician on call, we will assume that you have returned to your regular daily activities without incident.  SIGNATURES/CONFIDENTIALITY: You and/or your care partner have signed paperwork which will be entered into your electronic medical record.  These signatures attest to the fact that that the information above on your After Visit Summary has been reviewed and is understood.  Full responsibility of the confidentiality of this discharge information lies with you  and/or your care-partner.

## 2014-09-07 ENCOUNTER — Telehealth: Payer: Self-pay | Admitting: *Deleted

## 2014-09-07 NOTE — Telephone Encounter (Signed)
  Follow up Call-  Call back number 09/06/2014  Post procedure Call Back phone  # [phone]  Permission to leave phone message Yes     Patient questions:  Do you have a fever, pain , or abdominal swelling? No. Pain Score  0 *  Have you tolerated food without any problems? Yes.    Have you been able to return to your normal activities? Yes.    Do you have any questions about your discharge instructions: Diet   No. Medications  No. Follow up visit  No.  Do you have questions or concerns about your Care? No.  Actions: * If pain score is 4 or above: No action needed, pain <4.

## 2014-09-26 ENCOUNTER — Telehealth: Payer: Self-pay

## 2014-09-26 MED ORDER — PREGABALIN 100 MG PO CAPS: 100.0000 mg | ORAL_CAPSULE | Freq: Two times a day (BID) | ORAL | Status: DC

## 2014-09-26 NOTE — Telephone Encounter (Signed)
[facility]/[address] refill request for LYRICA 100MG  CAPSULES. Last filled 08/27/2014

## 2014-09-26 NOTE — Telephone Encounter (Signed)
rx called in

## 2014-09-26 NOTE — Telephone Encounter (Signed)
Call in #60 with 5 rf 

## 2014-09-28 ENCOUNTER — Other Ambulatory Visit: Payer: Self-pay | Admitting: Family Medicine

## 2014-09-28 ENCOUNTER — Other Ambulatory Visit: Payer: Self-pay | Admitting: *Deleted

## 2014-09-28 DIAGNOSIS — M25562 Pain in left knee: Principal | ICD-10-CM

## 2014-09-28 DIAGNOSIS — M25561 Pain in right knee: Secondary | ICD-10-CM

## 2014-10-11 ENCOUNTER — Telehealth: Payer: Self-pay | Admitting: Family Medicine

## 2014-10-11 ENCOUNTER — Encounter: Payer: Self-pay | Admitting: Family Medicine

## 2014-10-11 MED ORDER — DIAZEPAM 5 MG PO TABS: 5.0000 mg | ORAL_TABLET | Freq: Four times a day (QID) | ORAL | Status: AC | PRN

## 2014-10-11 MED ORDER — HYDROMORPHONE HCL 4 MG PO TABS: 4.0000 mg | ORAL_TABLET | Freq: Four times a day (QID) | ORAL | Status: DC | PRN

## 2014-10-11 NOTE — Telephone Encounter (Signed)
Call in Valium 5 mg to take every 6 hours prn anxiety, #30 with no rf 

## 2014-10-11 NOTE — Telephone Encounter (Signed)
Script is ready for pick up and I spoke with pt.  

## 2014-10-11 NOTE — Telephone Encounter (Signed)
done

## 2014-10-17 ENCOUNTER — Other Ambulatory Visit: Payer: Self-pay | Admitting: Orthopedic Surgery

## 2014-10-17 DIAGNOSIS — M25561 Pain in right knee: Secondary | ICD-10-CM

## 2014-10-17 DIAGNOSIS — M25562 Pain in left knee: Principal | ICD-10-CM

## 2014-10-18 ENCOUNTER — Other Ambulatory Visit: Payer: BLUE CROSS/BLUE SHIELD

## 2014-10-18 ENCOUNTER — Ambulatory Visit
Admission: RE | Admit: 2014-10-18 | Discharge: 2014-10-18 | Disposition: A | Discharge status: Home / Self Care | Payer: BLUE CROSS/BLUE SHIELD | Source: Ambulatory Visit | Attending: Family Medicine | Admitting: Family Medicine

## 2014-10-18 DIAGNOSIS — M25562 Pain in left knee: Principal | ICD-10-CM

## 2014-10-18 DIAGNOSIS — M25561 Pain in right knee: Secondary | ICD-10-CM

## 2014-10-23 ENCOUNTER — Ambulatory Visit (INDEPENDENT_AMBULATORY_CARE_PROVIDER_SITE_OTHER): Payer: BLUE CROSS/BLUE SHIELD | Admitting: Family Medicine

## 2014-10-23 ENCOUNTER — Encounter: Payer: Self-pay | Admitting: Family Medicine

## 2014-10-23 VITALS — BP 130/80 | HR 86 | Temp 98.8°F | Wt 239.0 lb

## 2014-10-23 DIAGNOSIS — M545 Low back pain, unspecified: Secondary | ICD-10-CM

## 2014-10-23 MED ORDER — PREDNISONE 10 MG PO TABS: ORAL_TABLET | ORAL | Status: DC

## 2014-10-23 NOTE — Progress Notes (Signed)
Subjective:    Patient ID: Cesar LenisPerry W Petite Sr., male    DOB: March 27, 1956, 59 y.o.   MRN: [id number]  HPI Patient seen with low back pain. He has a long history of chronic back pain. He is followed by neurosurgery. He's had previous surgery L4-5 and L5-S1. Current pain started last Friday. He was exercising using elliptical and right after starting felt pain in his right lumbar area. He describes a sharp pain confined his lumbar spine. No radiculopathy symptoms. He tried some physical therapy stretches without improvement. Denies lower extremity numbness or weakness. No loss of bladder or bowel control. He takes Lyrica at baseline. He has taken Diluadin over the weekend  Past Medical History  Diagnosis Date  . Depression   . GERD (gastroesophageal reflux disease)     per Dr. Russella DarStark  . Hypertension   . Allergy   . Headache(784.0)   . Insomnia   . Subdural hematoma 1997  . Erectile dysfunction   . Hemorrhoids   . Low back pain   . Osteoarthritis   . Post traumatic stress disorder (PTSD)     from emotional trauma during serivce in the [employer]  . Complication of anesthesia     difficulty urinating   . Family history of anesthesia complication     Mother reports that it takes a while for her to wake up  . Diverticulitis   . Bipartite patella   . Sleep apnea     years ago in [place]  . Neuromuscular disorder     pinched nerve in shoulder  . Neuromuscular disease     sciatica   . Blood transfusion without reported diagnosis   . Cataract    Past Surgical History  Procedure Laterality Date  . Burr hole for subdural hematoma  1997  . Lumbar laminectomy  2000  . Vasectomy    . Knee arthroscopy  2005    right knee, repeat right knee arthoscopy 01/29/10 per Dr. Dorene GrebeScott Dean  . Ulnar nerve transposition    . Cataract extraction Right 1985  . Eye surgery    . Colonoscopy  11-18-07    per Dr. Russella DarStark, diverticulosis and internal hemorrhoids, repeat in 5 years   . Anterior cervical decomp/discectomy  fusion N/A 07/13/2013    Procedure: ANTERIOR CERVICAL DECOMPRESSION/DISCECTOMY FUSION 3 LEVELS;  Surgeon: Mariam DollarGary P Cram, MD;  Location: [facility];  Service: Neurosurgery;  Laterality: N/A;  ANTERIOR CERVICAL DECOMPRESSION/DISCECTOMY FUSION 3 LEVELS  . Esophagogastroduodenoscopy  08-18-08    per Dr. Russella DarStark, erosive esophagitis   . Upper gastrointestinal endoscopy    . Back surgery      rods in back and fusion L4,5 andd 5S1    reports that he quit smoking about 12 years ago. His smoking use included Cigarettes. He has a 15 pack-year smoking history. He has never used smokeless tobacco. He reports that he drinks alcohol. He reports that he does not use illicit drugs. family history includes Alcohol abuse in an other family member; Cancer in an other family member; Colon cancer in his maternal uncle; Congestive Heart Failure in his father; Diabetes in his father and another family member; Heart disease in an other family member; Hyperlipidemia in an other family member; Hypertension in an other family member; Mental illness in an other family member; Stomach cancer in his maternal uncle and maternal uncle; Sudden death in an other family member. There is no history of Esophageal cancer. Allergies  Allergen Reactions  . Avelox [Moxifloxacin Hcl In  Nacl] Anaphylaxis  . Cefdinir     REACTION: itching, swelling  . Hydromorphone Itching  . Oxycodone Itching  . Prochlorperazine Edisylate     REACTION: "Flighty" compazine      Review of Systems  Constitutional: Negative for fever, activity change, appetite change and unexpected weight change.  Respiratory: Negative for cough and shortness of breath.   Cardiovascular: Negative for chest pain and leg swelling.  Gastrointestinal: Negative for vomiting and abdominal pain.  Genitourinary: Negative for dysuria, hematuria and flank pain.  Musculoskeletal: Positive for back pain. Negative for joint swelling.  Neurological: Negative for weakness and  numbness.       Objective:   Physical Exam  Constitutional: He appears well-developed and well-nourished.  Cardiovascular: Normal rate and regular rhythm.   Pulmonary/Chest: Effort normal and breath sounds normal. No respiratory distress. He has no wheezes. He has no rales.  Musculoskeletal:  Straight leg raises produce low back pain but no pain down lower extremity  Neurological:  Full-strength lower extremities. Absent knee reflex bilaterally and trace ankle reflex bilaterally. Normal sensory function to touch          Assessment & Plan:  Right lumbar back pain. He has chronic back pain but this is acute on chronic pain. Continue heat or ice for symptom relief. Continue home stretches. Already has Dilaudid for acute use. Try prednisone taper. Follow-up with neurosurgeon if not improving over the next couple of weeks

## 2014-10-23 NOTE — Progress Notes (Signed)
Pre visit review using our clinic review tool, if applicable. No additional management support is needed unless otherwise documented below in the visit note. 

## 2014-10-23 NOTE — Patient Instructions (Signed)

## 2014-11-07 ENCOUNTER — Encounter: Payer: Self-pay | Admitting: Family Medicine

## 2014-11-08 MED ORDER — HYDROMORPHONE HCL 4 MG PO TABS: 4.0000 mg | ORAL_TABLET | Freq: Four times a day (QID) | ORAL | Status: DC | PRN

## 2014-11-08 NOTE — Telephone Encounter (Signed)
done

## 2014-11-09 ENCOUNTER — Ambulatory Visit (INDEPENDENT_AMBULATORY_CARE_PROVIDER_SITE_OTHER): Payer: BLUE CROSS/BLUE SHIELD | Admitting: Family Medicine

## 2014-11-09 ENCOUNTER — Encounter: Payer: Self-pay | Admitting: Family Medicine

## 2014-11-09 VITALS — BP 142/75 | HR 100 | Temp 99.1°F | Ht 74.0 in | Wt 236.0 lb

## 2014-11-09 DIAGNOSIS — M5440 Lumbago with sciatica, unspecified side: Secondary | ICD-10-CM | POA: Diagnosis not present

## 2014-11-09 MED ORDER — HYDROMORPHONE HCL 8 MG PO TABS: 8.0000 mg | ORAL_TABLET | Freq: Four times a day (QID) | ORAL | Status: DC | PRN

## 2014-11-09 MED ORDER — METHYLPREDNISOLONE ACETATE 80 MG/ML IJ SUSP
120.0000 mg | Freq: Once | INTRAMUSCULAR | Status: AC
  Administered 2014-11-09: 120 mg via INTRAMUSCULAR

## 2014-11-09 NOTE — Addendum Note (Signed)
Addended by: Aniceto BossNIMMONS, SYLVIA A on: 11/09/2014 11:37 AM   Modules accepted: Orders

## 2014-11-09 NOTE — Progress Notes (Signed)
Pre visit review using our clinic review tool, if applicable. No additional management support is needed unless otherwise documented below in the visit note. 

## 2014-11-09 NOTE — Progress Notes (Signed)
   Subjective:    Patient ID: Cesar LenisPerry W Rebert Sr., male    DOB: 1955-11-22, 59 y.o.   MRN: [id number]  HPI Here asking for advice about his lower back. He has been having more pain than usual for the past month or two. He is using 4 mg of Dilaudid but asks if the dose can be increased. He is [facility] and he is riding a stationary bike, doing a hand cycle, and doing Pilates exercises.    Review of Systems  Constitutional: Negative.   Musculoskeletal: Positive for back pain.       Objective:   Physical Exam  Constitutional: He appears well-developed and well-nourished.  Musculoskeletal:  Tender in the lower back with reduced ROM          Assessment & Plan:  We will increase the Dilaudid to 8 mg prn. Given a DepoMedrol shot today. I suggested he see Dr. Wynetta Emeryram again to ask about getting a epidural steroid shot.

## 2014-12-06 MED ORDER — HYDROMORPHONE HCL 8 MG PO TABS: 8.0000 mg | ORAL_TABLET | Freq: Four times a day (QID) | ORAL | Status: DC | PRN

## 2014-12-06 NOTE — Telephone Encounter (Signed)
done

## 2015-01-09 ENCOUNTER — Encounter: Payer: Self-pay | Admitting: Family Medicine

## 2015-01-12 ENCOUNTER — Telehealth: Payer: Self-pay | Admitting: Family Medicine

## 2015-01-12 MED ORDER — HYDROMORPHONE HCL 8 MG PO TABS: 8.0000 mg | ORAL_TABLET | Freq: Four times a day (QID) | ORAL | Status: DC | PRN

## 2015-01-12 NOTE — Telephone Encounter (Signed)
Pt request refill of the following: HYDROmorphone (DILAUDID) 8 MG tablet  Pt said he sent a email requesting a refill. He said he is out of town and need someone to pick the rx up today    Phamacy:

## 2015-01-12 NOTE — Telephone Encounter (Signed)
Script is ready for pick up and pt is here now.  

## 2015-01-12 NOTE — Telephone Encounter (Signed)
done

## 2015-02-12 ENCOUNTER — Telehealth: Payer: Self-pay | Admitting: Family Medicine

## 2015-02-12 MED ORDER — HYDROMORPHONE HCL 8 MG PO TABS: 8.0000 mg | ORAL_TABLET | Freq: Four times a day (QID) | ORAL | Status: DC | PRN

## 2015-02-12 NOTE — Telephone Encounter (Signed)
Pt would like a refill on his Dilaudid please. Call cell when ready for pick up.

## 2015-02-12 NOTE — Telephone Encounter (Signed)
done

## 2015-02-13 NOTE — Telephone Encounter (Signed)
Script is ready for pick up and I left a voice message for pt. 

## 2015-03-14 ENCOUNTER — Emergency Department (HOSPITAL_COMMUNITY)
Admission: EM | Admit: 2015-03-14 | Discharge: 2015-03-14 | Disposition: A | Discharge status: Home / Self Care | Payer: BLUE CROSS/BLUE SHIELD | Source: Home / Self Care | Attending: Family Medicine | Admitting: Family Medicine

## 2015-03-14 ENCOUNTER — Encounter (HOSPITAL_COMMUNITY): Payer: Self-pay | Admitting: Family Medicine

## 2015-03-14 DIAGNOSIS — S41111A Laceration without foreign body of right upper arm, initial encounter: Secondary | ICD-10-CM

## 2015-03-14 DIAGNOSIS — Z23 Encounter for immunization: Secondary | ICD-10-CM

## 2015-03-14 MED ORDER — TETANUS-DIPHTH-ACELL PERTUSSIS 5-2.5-18.5 LF-MCG/0.5 IM SUSP
INTRAMUSCULAR | Status: AC
  Filled 2015-03-14: qty 0.5

## 2015-03-14 MED ORDER — BACITRACIN ZINC 500 UNIT/GM EX OINT
TOPICAL_OINTMENT | CUTANEOUS | Status: AC
  Filled 2015-03-14: qty 2.7

## 2015-03-14 MED ORDER — TETANUS-DIPHTH-ACELL PERTUSSIS 5-2.5-18.5 LF-MCG/0.5 IM SUSP
0.5000 mL | Freq: Once | INTRAMUSCULAR | Status: AC
  Administered 2015-03-14: 0.5 mL via INTRAMUSCULAR

## 2015-03-14 NOTE — Discharge Instructions (Signed)
The laceration to your right arm was repaired using 5 dissolvable stitches and 9 superficial stitches. Please take out the top stitches in 6 days. Until that time please make sure to wash the area daily with warm soapy water in the shower and then apply some antibiotic ointment. Please apply an ice pack to your arm 30 minutes at a time 2-4 times a day for the next 2 days. Please use ibuprofen for additional pain relief. Please look for signs of infection and call us if you develop any swelling worsening pain or discharge from the area. The scar will be at its weakest point at around week 3 so please minimize the amount of strain that you're putting over the scar. In the long-term the use of vitamin E ointment or lotion will help the scar to soften.

## 2015-03-14 NOTE — ED Provider Notes (Signed)
CSN: [id number]     Arrival date & time 03/14/15  1913 History   First MD Initiated Contact with Patient 03/14/15 2019     Chief Complaint  Patient presents with  . Laceration   (Consider location/radiation/quality/duration/timing/severity/associated sxs/prior Treatment) HPI  Larey Seat on a boat trailer today at 16:00. Struck R elbow. Unsure of last tetanus. Area is tender but no swelling. Bleeding was stopped by applying pressure. Patient states that he continue to work on his boat and trailer for some amount of time after the initial injury. Strength and sensation intact throughout arm and hand.     Past Medical History  Diagnosis Date  . Depression   . GERD (gastroesophageal reflux disease)     per Dr. Russella Dar  . Hypertension   . Allergy   . Headache(784.0)   . Insomnia   . Subdural hematoma 1997  . Erectile dysfunction   . Hemorrhoids   . Low back pain   . Osteoarthritis   . Post traumatic stress disorder (PTSD)     from emotional trauma during serivce in the [employer]  . Complication of anesthesia     difficulty urinating   . Family history of anesthesia complication     Mother reports that it takes a while for her to wake up  . Diverticulitis   . Bipartite patella   . Sleep apnea     years ago in [place]  . Neuromuscular disorder     pinched nerve in shoulder  . Neuromuscular disease     sciatica   . Blood transfusion without reported diagnosis   . Cataract    Past Surgical History  Procedure Laterality Date  . Burr hole for subdural hematoma  1997  . Lumbar laminectomy  2000  . Vasectomy    . Knee arthroscopy  2005    right knee, repeat right knee arthoscopy 01/29/10 per Dr. Dorene Grebe  . Ulnar nerve transposition    . Cataract extraction Right 1985  . Eye surgery    . Colonoscopy  11-18-07    per Dr. Russella Dar, diverticulosis and internal hemorrhoids, repeat in 5 years   . Anterior cervical decomp/discectomy fusion N/A 07/13/2013    Procedure: ANTERIOR CERVICAL  DECOMPRESSION/DISCECTOMY FUSION 3 LEVELS;  Surgeon: Mariam Dollar, MD;  Location: [facility];  Service: Neurosurgery;  Laterality: N/A;  ANTERIOR CERVICAL DECOMPRESSION/DISCECTOMY FUSION 3 LEVELS  . Esophagogastroduodenoscopy  08-18-08    per Dr. Russella Dar, erosive esophagitis   . Upper gastrointestinal endoscopy    . Back surgery      rods in back and fusion L4,5 andd 5S1   Family History  Problem Relation Age of Onset  . Alcohol abuse    . Cancer      colon first degree relative  . Diabetes      first degree relative  . Hyperlipidemia      family history  . Hypertension      family history  . Mental illness      family history  . Sudden death      family history  . Heart disease      family history  . Colon cancer Maternal Uncle   . Esophageal cancer Neg Hx   . Stomach cancer Maternal Uncle   . Stomach cancer Maternal Uncle   . Diabetes Father   . Congestive Heart Failure Father    Social History  Substance Use Topics  . Smoking status: Former Smoker -- 0.50 packs/day for 30 years  Types: Cigarettes    Quit date: 09/27/2002  . Smokeless tobacco: Never Used  . Alcohol Use: 0.0 oz/week    0 Standard drinks or equivalent per week     Comment: occ    Review of Systems Per HPI with all other pertinent systems negative.   Allergies  Avelox; Cefdinir; Oxycodone; and Prochlorperazine edisylate  Home Medications   Prior to Admission medications   Medication Sig Start Date End Date Taking? Authorizing Provider  atorvastatin (LIPITOR) 40 MG tablet Take 1 tablet (40 mg total) by mouth daily. 03/03/14   Nelwyn Salisbury, MD  buPROPion (WELLBUTRIN) 100 MG tablet Take 100 mg by mouth 2 (two) times daily.    Historical Provider, MD  cyclobenzaprine (FLEXERIL) 10 MG tablet Take 1 tablet (10 mg total) by mouth 3 (three) times daily as needed for muscle spasms. 06/30/14   Nelwyn Salisbury, MD  diazepam (VALIUM) 5 MG tablet Take 1 tablet (5 mg total) by mouth every 6 (six) hours as needed  for anxiety. Patient not taking: Reported on 11/09/2014 10/11/14   Nelwyn Salisbury, MD  esomeprazole (NEXIUM) 40 MG capsule Take 40 mg by mouth 2 (two) times daily as needed. For heartburn    Historical Provider, MD  fexofenadine (ALLEGRA) 180 MG tablet Take 180 mg by mouth daily as needed for allergies.     Historical Provider, MD  hydrochlorothiazide (HYDRODIURIL) 25 MG tablet Take 1 tablet (25 mg total) by mouth daily. 02/21/14   Nelwyn Salisbury, MD  hydrocortisone (ANUSOL-HC) 25 MG suppository Place 1 suppository (25 mg total) rectally 2 (two) times daily. 07/02/13   Reuben Likes, MD  HYDROmorphone (DILAUDID) 8 MG tablet Take 1 tablet (8 mg total) by mouth every 6 (six) hours as needed for severe pain. 02/12/15   Nelwyn Salisbury, MD  meclizine (ANTIVERT) 25 MG tablet Take 1 tablet (25 mg total) by mouth every 4 (four) hours as needed for dizziness. Patient not taking: Reported on 11/09/2014 11/21/13   Nelwyn Salisbury, MD  polyethylene glycol [facility]) packet Take 17 g by mouth daily as needed (for daily soft stools).    Historical Provider, MD  predniSONE (DELTASONE) 10 MG tablet Taper as follows: 6-6-4-4-3-3-2-2 Patient not taking: Reported on 11/09/2014 10/23/14   Kristian Covey, MD  pregabalin (LYRICA) 100 MG capsule Take 1 capsule (100 mg total) by mouth 2 (two) times daily. 09/26/14   Nelwyn Salisbury, MD  senna (SENOKOT) 8.6 MG TABS tablet Take 2 tablets by mouth 2 (two) times daily.     Historical Provider, MD  sildenafil (VIAGRA) 100 MG tablet Take 100 mg by mouth daily as needed for erectile dysfunction.    Historical Provider, MD  traZODone (DESYREL) 100 MG tablet Take 100 mg by mouth at bedtime.    Historical Provider, MD  vitamin B-12 (CYANOCOBALAMIN) 1000 MCG tablet Take 1,000 mcg by mouth daily.    Historical Provider, MD   There were no vitals taken for this visit. Physical Exam Physical Exam  Constitutional: oriented to person, place, and time. appears well-developed and  well-nourished. No distress.  HENT:  Head: Normocephalic and atraumatic.  Eyes: EOMI. PERRL.  Neck: Normal range of motion.  Cardiovascular: RRR, no m/r/g, 2+ distal pulses,  Pulmonary/Chest: Effort normal and breath sounds normal. No respiratory distress.  Abdominal: Soft. Bowel sounds are normal. NonTTP, no distension.  Musculoskeletal: Normal range of motion. Non ttp, no effusion.  Neurological: alert and oriented to person, place, and time.  Skin: 2.9 cm oblique laceration over the lateral elbow.  Psychiatric: normal mood and affect. behavior is normal. Judgment and thought content normal.    ED Course  LACERATION REPAIR Date/Time: 03/14/2015 9:19 PM Performed by: Konrad Dolores, DAVID J Authorized by: Konrad Dolores, DAVID J Consent: Verbal consent obtained. Risks and benefits: risks, benefits and alternatives were discussed Consent given by: patient and spouse Patient identity confirmed: verbally with patient Location: Lateral right elbow. Laceration length: 2.9 cm Contamination: The wound is contaminated. Foreign bodies: unknown Tendon involvement: none Nerve involvement: none Vascular damage: no Anesthesia: local infiltration (3 mL) Local anesthetic: lidocaine 2% with epinephrine Patient sedated: no Preparation: Patient was prepped and draped in the usual sterile fashion. Irrigation solution: Betadine and saline. Amount of cleaning: standard Debridement: minimal Degree of undermining: none Skin closure: 4-0 Prolene (9 sutures) Subcutaneous closure: 4-0 Vicryl (5 sutures) Technique: simple Approximation: close Approximation difficulty: complex Dressing: antibiotic ointment and gauze roll Patient tolerance: Patient tolerated the procedure well with no immediate complications   (including critical care time) Labs Review Labs Reviewed - No data to display  Imaging Review No results found.   MDM   1. Arm laceration, right, initial encounter    Laceration repair as above.  Anabolic ointment applied. Very detailed wound care instructions given. Tetanus updated in our clinic. Sutures will need to be removed in 6 days.   Ozella Rocks, MD 03/14/15 2121

## 2015-03-14 NOTE — ED Notes (Signed)
C/o right elbow laceration States he was cleaning his boat when he fell and hit elbow

## 2015-03-21 ENCOUNTER — Encounter: Payer: Self-pay | Admitting: Family Medicine

## 2015-03-21 ENCOUNTER — Ambulatory Visit (INDEPENDENT_AMBULATORY_CARE_PROVIDER_SITE_OTHER): Payer: BLUE CROSS/BLUE SHIELD | Admitting: Family Medicine

## 2015-03-21 VITALS — BP 132/75 | HR 94 | Temp 98.3°F | Ht 74.0 in | Wt 242.0 lb

## 2015-03-21 DIAGNOSIS — S41111D Laceration without foreign body of right upper arm, subsequent encounter: Secondary | ICD-10-CM

## 2015-03-21 DIAGNOSIS — Z23 Encounter for immunization: Secondary | ICD-10-CM | POA: Diagnosis not present

## 2015-03-21 NOTE — Progress Notes (Signed)
Pre visit review using our clinic review tool, if applicable. No additional management support is needed unless otherwise documented below in the visit note. 

## 2015-03-21 NOTE — Progress Notes (Signed)
   Subjective:    Patient ID: Cesar Lenis., male    DOB: 12/07/1955, 59 y.o.   MRN: [id number]  HPI Here to remove sutures from the right elbow that were placed in Urgent Care on 03-14-15. That day while working on his boat trailer he slipped and fell, with this arm striking the fender. He has been dressing it with Neosporin. The pain has resolved.    Review of Systems  Constitutional: Negative.   Skin: Positive for wound.       Objective:   Physical Exam  Constitutional: He appears well-developed and well-nourished.  Skin:  The lateral  right elbow has a healing laceration with sutures. This is not red or tender, he has full ROM           Assessment & Plan:  S/P laceration. All sutures were removed.

## 2015-03-28 ENCOUNTER — Encounter: Payer: Self-pay | Admitting: Family Medicine

## 2015-03-29 NOTE — Telephone Encounter (Signed)
Call in Trazodone #30 with 11 rf

## 2015-03-30 MED ORDER — TRAZODONE HCL 100 MG PO TABS: 100.0000 mg | ORAL_TABLET | Freq: Every day | ORAL | Status: AC

## 2015-03-30 NOTE — Telephone Encounter (Signed)
I sent script e-scribe. 

## 2015-03-30 NOTE — Telephone Encounter (Signed)
Pt states he is going out of town in the am. [employer] [address]

## 2015-04-17 ENCOUNTER — Other Ambulatory Visit (INDEPENDENT_AMBULATORY_CARE_PROVIDER_SITE_OTHER): Payer: BLUE CROSS/BLUE SHIELD

## 2015-04-17 DIAGNOSIS — Z0000 Encounter for general adult medical examination without abnormal findings: Secondary | ICD-10-CM | POA: Diagnosis not present

## 2015-04-17 LAB — BASIC METABOLIC PANEL
BUN: 10 mg/dL (ref 6–23)
CO2: 29 mEq/L (ref 19–32)
CREATININE: 0.97 mg/dL (ref 0.40–1.50)
Calcium: 9.1 mg/dL (ref 8.4–10.5)
Chloride: 103 mEq/L (ref 96–112)
GFR: 101.45 mL/min (ref >60.00)
Glucose, Bld: 112 mg/dL — ABNORMAL HIGH (ref 70–99)
Potassium: 3.9 mEq/L (ref 3.5–5.1)
Sodium: 140 mEq/L (ref 135–145)

## 2015-04-17 LAB — HEPATIC FUNCTION PANEL
ALT: 37 U/L (ref 0–53)
AST: 37 U/L (ref 0–37)
Albumin: 4 g/dL (ref 3.5–5.2)
Alkaline Phosphatase: 76 U/L (ref 39–117)
BILIRUBIN TOTAL: 0.3 mg/dL (ref 0.2–1.2)
Bilirubin, Direct: 0.1 mg/dL (ref 0.0–0.3)
Total Protein: 6.7 g/dL (ref 6.0–8.3)

## 2015-04-17 LAB — LIPID PANEL
CHOLESTEROL: 108 mg/dL (ref 0–200)
HDL: 37.9 mg/dL — ABNORMAL LOW (ref >39.00)
LDL CALC: 58 mg/dL (ref 0–99)
NonHDL: 70.14
TRIGLYCERIDES: 62 mg/dL (ref 0.0–149.0)
Total CHOL/HDL Ratio: 3
VLDL: 12.4 mg/dL (ref 0.0–40.0)

## 2015-04-17 LAB — POCT URINALYSIS DIPSTICK
BILIRUBIN UA: NEGATIVE
Blood, UA: NEGATIVE
Glucose, UA: NEGATIVE
Leukocytes, UA: NEGATIVE
Nitrite, UA: NEGATIVE
SPEC GRAV UA: 1.015
Urobilinogen, UA: 0.2
pH, UA: 7.5

## 2015-04-17 LAB — CBC WITH DIFFERENTIAL/PLATELET
BASOS PCT: 0.5 % (ref 0.0–3.0)
Basophils Absolute: 0 10*3/uL (ref 0.0–0.1)
EOS ABS: 0.1 10*3/uL (ref 0.0–0.7)
EOS PCT: 2.2 % (ref 0.0–5.0)
HEMATOCRIT: 40.9 % (ref 39.0–52.0)
Hemoglobin: 13.4 g/dL (ref 13.0–17.0)
LYMPHS PCT: 48.8 % — AB (ref 12.0–46.0)
Lymphs Abs: 2.2 10*3/uL (ref 0.7–4.0)
MCHC: 32.8 g/dL (ref 30.0–36.0)
MCV: 91.5 fl (ref 78.0–100.0)
MONO ABS: 0.3 10*3/uL (ref 0.1–1.0)
Monocytes Relative: 6 % (ref 3.0–12.0)
NEUTROS ABS: 2 10*3/uL (ref 1.4–7.7)
Neutrophils Relative %: 42.5 % — ABNORMAL LOW (ref 43.0–77.0)
PLATELETS: 169 10*3/uL (ref 150.0–400.0)
RBC: 4.48 Mil/uL (ref 4.22–5.81)
RDW: 14 % (ref 11.5–15.5)
WBC: 4.6 10*3/uL (ref 4.0–10.5)

## 2015-04-17 LAB — PSA: PSA: 0.47 ng/mL (ref 0.10–4.00)

## 2015-04-17 LAB — TSH: TSH: 1.35 u[IU]/mL (ref 0.35–4.50)

## 2015-04-24 ENCOUNTER — Encounter: Payer: Self-pay | Admitting: Family Medicine

## 2015-04-24 ENCOUNTER — Ambulatory Visit (INDEPENDENT_AMBULATORY_CARE_PROVIDER_SITE_OTHER): Payer: BLUE CROSS/BLUE SHIELD | Admitting: Family Medicine

## 2015-04-24 VITALS — BP 133/73 | HR 104 | Temp 98.4°F | Ht 74.0 in | Wt 240.0 lb

## 2015-04-24 DIAGNOSIS — Z0000 Encounter for general adult medical examination without abnormal findings: Secondary | ICD-10-CM

## 2015-04-24 NOTE — Progress Notes (Signed)
   Subjective:    Patient ID: Cesar Lenis., male    DOB: 12/14/1955, 59 y.o.   MRN: [id number]  HPI 59 yr old male for a cpx. He is doing well in general. He still gets a lot of his medications from the [place]. He admits to snacking a lot at night. His fasting glucose was elevated to 112. His BP is stable.    Review of Systems  Constitutional: Negative.   HENT: Negative.   Eyes: Negative.   Respiratory: Negative.   Cardiovascular: Negative.   Gastrointestinal: Negative.   Genitourinary: Negative.   Musculoskeletal: Negative.   Skin: Negative.   Neurological: Negative.   Psychiatric/Behavioral: Negative.        Objective:   Physical Exam  Constitutional: He is oriented to person, place, and time. He appears well-developed and well-nourished. No distress.  HENT:  Head: Normocephalic and atraumatic.  Right Ear: External ear normal.  Left Ear: External ear normal.  Nose: Nose normal.  Mouth/Throat: Oropharynx is clear and moist. No oropharyngeal exudate.  Eyes: Conjunctivae and EOM are normal. Pupils are equal, round, and reactive to light. Right eye exhibits no discharge. Left eye exhibits no discharge. No scleral icterus.  Neck: Neck supple. No JVD present. No tracheal deviation present. No thyromegaly present.  Cardiovascular: Normal rate, regular rhythm, normal heart sounds and intact distal pulses.  Exam reveals no gallop and no friction rub.   No murmur heard. EKG stable   Pulmonary/Chest: Effort normal and breath sounds normal. No respiratory distress. He has no wheezes. He has no rales. He exhibits no tenderness.  Abdominal: Soft. Bowel sounds are normal. He exhibits no distension and no mass. There is no tenderness. There is no rebound and no guarding.  Genitourinary: Rectum normal, prostate normal and penis normal. Guaiac negative stool. No penile tenderness.  Musculoskeletal: Normal range of motion. He exhibits no edema or tenderness.  Lymphadenopathy:    He has no  cervical adenopathy.  Neurological: He is alert and oriented to person, place, and time. He has normal reflexes. No cranial nerve deficit. He exhibits normal muscle tone. Coordination normal.  Skin: Skin is warm and dry. No rash noted. He is not diaphoretic. No erythema. No pallor.  Psychiatric: He has a normal mood and affect. His behavior is normal. Judgment and thought content normal.          Assessment & Plan:  Well exam. We discussed diet and exercise advice. As for the elevated glucose, I advised him to reduce his carb intake. We will recheck his glucose in 6 months

## 2015-04-24 NOTE — Progress Notes (Signed)
Pre visit review using our clinic review tool, if applicable. No additional management support is needed unless otherwise documented below in the visit note. 

## 2015-05-03 ENCOUNTER — Telehealth: Payer: Self-pay | Admitting: Family Medicine

## 2015-05-03 NOTE — Telephone Encounter (Signed)
He will need an OV to see what the best treatment might be. He can use Ibuprofen and heat over the ear in the meantime

## 2015-05-03 NOTE — Telephone Encounter (Signed)
Pt call to say he has a ear infection and is asking if something can be called in for him   [address]aram[facility]  [address]

## 2015-05-03 NOTE — Telephone Encounter (Signed)
I spoke with pt  

## 2015-05-04 ENCOUNTER — Ambulatory Visit (INDEPENDENT_AMBULATORY_CARE_PROVIDER_SITE_OTHER): Payer: BLUE CROSS/BLUE SHIELD | Admitting: Family Medicine

## 2015-05-04 ENCOUNTER — Encounter: Payer: Self-pay | Admitting: Family Medicine

## 2015-05-04 VITALS — BP 153/84 | HR 98 | Temp 98.3°F | Ht 74.0 in | Wt 243.0 lb

## 2015-05-04 DIAGNOSIS — J019 Acute sinusitis, unspecified: Secondary | ICD-10-CM

## 2015-05-04 MED ORDER — AZITHROMYCIN 250 MG PO TABS: ORAL_TABLET | ORAL | Status: DC

## 2015-05-04 NOTE — Progress Notes (Signed)
Pre visit review using our clinic review tool, if applicable. No additional management support is needed unless otherwise documented below in the visit note. 

## 2015-05-04 NOTE — Progress Notes (Signed)
   Subjective:    Patient ID: Arnoldo Lenis., male    DOB: 1955-12-29, 59 y.o.   MRN: [id number]  HPI Here for 5 days of sinus pressure, PND, HA, and ST. Some left ear pain. No coughing.   Review of Systems  Constitutional: Negative.   HENT: Positive for congestion, postnasal drip and sinus pressure.   Eyes: Negative.   Respiratory: Negative.        Objective:   Physical Exam  Constitutional: He appears well-developed and well-nourished.  HENT:  Right Ear: External ear normal.  Left Ear: External ear normal.  Nose: Nose normal.  Mouth/Throat: Oropharynx is clear and moist.  Eyes: Conjunctivae are normal.  Neck: No thyromegaly present.  Cardiovascular: Normal rate, regular rhythm, normal heart sounds and intact distal pulses.   Pulmonary/Chest: Effort normal and breath sounds normal.  Lymphadenopathy:    He has no cervical adenopathy.          Assessment & Plan:  Sinusitis, treat with a Zpack and Mucinex

## 2015-05-21 ENCOUNTER — Ambulatory Visit (INDEPENDENT_AMBULATORY_CARE_PROVIDER_SITE_OTHER): Payer: BLUE CROSS/BLUE SHIELD | Admitting: Family Medicine

## 2015-05-21 ENCOUNTER — Encounter: Payer: Self-pay | Admitting: Family Medicine

## 2015-05-21 VITALS — BP 144/84 | HR 97 | Temp 98.6°F | Ht 74.0 in | Wt 244.0 lb

## 2015-05-21 DIAGNOSIS — M542 Cervicalgia: Secondary | ICD-10-CM

## 2015-05-21 DIAGNOSIS — M7711 Lateral epicondylitis, right elbow: Secondary | ICD-10-CM | POA: Insufficient documentation

## 2015-05-21 MED ORDER — HYDROMORPHONE HCL 8 MG PO TABS: 8.0000 mg | ORAL_TABLET | Freq: Four times a day (QID) | ORAL | Status: DC | PRN

## 2015-05-21 NOTE — Progress Notes (Signed)
Pre visit review using our clinic review tool, if applicable. No additional management support is needed unless otherwise documented below in the visit note. 

## 2015-05-21 NOTE — Progress Notes (Signed)
   Subjective:    Patient ID: Cesar LenisPerry W Fromer Sr., male    DOB: February 19, 1956, 59 y.o.   MRN: [id number]  HPI Here for 2 problems. First he continues to struggle with chronic pain in the neck and the right shoulder. Dr. Wynetta Emeryram has told him that there is not much else he can do from a surgical perspective. Marina Goodellerry has tried standard PT and he gets dry needle treatments at times, but nothing really helps. Second about 2 weeks ago he felt a sudden sharp pain in the right forearm and elbow while lifting weights, and this pain has bothered him ever since.   Review of Systems  Constitutional: Negative.   Respiratory: Negative.   Cardiovascular: Negative.   Musculoskeletal: Positive for arthralgias, neck pain and neck stiffness. Negative for back pain and joint swelling.  Neurological: Negative.        Objective:   Physical Exam  Constitutional: He appears well-developed and well-nourished. No distress.  Cardiovascular: Normal rate, regular rhythm, normal heart sounds and intact distal pulses.   Pulmonary/Chest: Effort normal and breath sounds normal.  Musculoskeletal:  The posterior neck is mildly tender with reduced ROM. He is quite tender over the right lateral epicondyle. There is no swelling. ROM is full           Assessment & Plan:  For the chronic neck pain I suggested he see a chiropractor. Perhaps manipulative therapy could help. For the tennis elbow he will rest the arm for 4 weeks and he is to avoid lifting any weights during tha time. Apply ice, wear a support sleeve, and take Ibuprofen prn.

## 2015-06-14 ENCOUNTER — Telehealth: Payer: Self-pay | Admitting: Family Medicine

## 2015-06-15 MED ORDER — PREGABALIN 100 MG PO CAPS: 100.0000 mg | ORAL_CAPSULE | Freq: Two times a day (BID) | ORAL | Status: AC

## 2015-06-15 MED ORDER — HYDROMORPHONE HCL 8 MG PO TABS: 8.0000 mg | ORAL_TABLET | Freq: Four times a day (QID) | ORAL | Status: DC | PRN

## 2015-06-15 NOTE — Addendum Note (Signed)
Addended by: Gershon CraneFRY, Maciej Schweitzer A on: 06/15/2015 05:26 PM   Modules accepted: Orders

## 2015-06-15 NOTE — Telephone Encounter (Signed)
done

## 2015-06-18 NOTE — Telephone Encounter (Signed)
Script is ready for pick up and I sent pt a my chart message.  

## 2015-07-24 ENCOUNTER — Other Ambulatory Visit: Payer: Self-pay | Admitting: Family Medicine

## 2015-07-24 NOTE — Telephone Encounter (Signed)
Hydromorphone last refilled 06/15/15 for #120 with 0 refills. Patient was last seen 05/21/15. Ok to refill this medication?

## 2015-07-24 NOTE — Telephone Encounter (Signed)
Mr. Cesar Knight would like a refill of Hydromorphone and he wants a phone call when this has been taken care of.  Pt's ph# [phone] Thank you.

## 2015-07-25 MED ORDER — HYDROMORPHONE HCL 8 MG PO TABS: 8.0000 mg | ORAL_TABLET | Freq: Four times a day (QID) | ORAL | Status: DC | PRN

## 2015-07-25 NOTE — Telephone Encounter (Signed)
done

## 2015-07-25 NOTE — Telephone Encounter (Signed)
Patient notified that Rx is ready for pick up.

## 2015-08-22 ENCOUNTER — Encounter: Payer: Self-pay | Admitting: Family Medicine

## 2015-08-22 MED ORDER — HYDROMORPHONE HCL 8 MG PO TABS: 8.0000 mg | ORAL_TABLET | Freq: Four times a day (QID) | ORAL | Status: DC | PRN

## 2015-08-22 NOTE — Telephone Encounter (Signed)
done

## 2015-09-04 ENCOUNTER — Encounter: Payer: Self-pay | Admitting: Family Medicine

## 2015-09-04 MED ORDER — METRONIDAZOLE 500 MG PO TABS: 500.0000 mg | ORAL_TABLET | Freq: Three times a day (TID) | ORAL | Status: AC

## 2015-09-04 NOTE — Telephone Encounter (Signed)
Call in Flagyl 500 mg tid for 7 days

## 2015-10-01 ENCOUNTER — Telehealth: Payer: Self-pay | Admitting: Family Medicine

## 2015-10-01 MED ORDER — HYDROMORPHONE HCL 8 MG PO TABS: 8.0000 mg | ORAL_TABLET | Freq: Four times a day (QID) | ORAL | Status: DC | PRN

## 2015-10-01 NOTE — Telephone Encounter (Signed)
Pt request refill of the following: HYDROmorphone (DILAUDID) 8 MG tablet   Phamacy:

## 2015-10-01 NOTE — Telephone Encounter (Signed)
done

## 2015-10-01 NOTE — Telephone Encounter (Signed)
Script is ready for pick up and I spoke with pt.  

## 2015-10-24 ENCOUNTER — Encounter: Payer: Self-pay | Admitting: Family Medicine

## 2015-10-26 MED ORDER — HYDROMORPHONE HCL 8 MG PO TABS: 8.0000 mg | ORAL_TABLET | Freq: Four times a day (QID) | ORAL | Status: DC | PRN

## 2015-10-26 NOTE — Telephone Encounter (Signed)
done

## 2015-11-29 ENCOUNTER — Telehealth: Payer: Self-pay | Admitting: Family Medicine

## 2015-11-29 NOTE — Telephone Encounter (Signed)
Pt request refill  °HYDROmorphone (DILAUDID) 8 MG tablet °

## 2015-11-30 MED ORDER — HYDROMORPHONE HCL 8 MG PO TABS: 8.0000 mg | ORAL_TABLET | Freq: Four times a day (QID) | ORAL | Status: DC | PRN

## 2015-11-30 NOTE — Telephone Encounter (Signed)
Script is ready for pick up and left a voice message for pt.  

## 2015-11-30 NOTE — Telephone Encounter (Signed)
done

## 2015-12-06 ENCOUNTER — Encounter: Payer: Self-pay | Admitting: Family Medicine

## 2015-12-07 NOTE — Telephone Encounter (Signed)
Please tell him that this will not help, I have tried before for other patients. I am healthy too, but our work sponsored [employer] insurance company denied me as well when I tried to add coverage

## 2015-12-21 ENCOUNTER — Encounter: Payer: Self-pay | Admitting: Family Medicine

## 2015-12-21 MED ORDER — KETOCONAZOLE 2 % EX CREA: 1.0000 "application " | TOPICAL_CREAM | Freq: Every day | CUTANEOUS | Status: AC | PRN

## 2015-12-21 NOTE — Telephone Encounter (Signed)
Call in Ketoconazole 2% cream to apply bid, 45 grams with 2 rf

## 2015-12-25 ENCOUNTER — Encounter: Payer: Self-pay | Admitting: Family Medicine

## 2015-12-25 MED ORDER — HYDROMORPHONE HCL 8 MG PO TABS: 8.0000 mg | ORAL_TABLET | Freq: Four times a day (QID) | ORAL | Status: DC | PRN

## 2015-12-25 NOTE — Telephone Encounter (Signed)
done

## 2016-01-31 ENCOUNTER — Encounter: Payer: Self-pay | Admitting: Family Medicine

## 2016-01-31 ENCOUNTER — Telehealth: Payer: Self-pay | Admitting: Family Medicine

## 2016-01-31 MED ORDER — HYDROMORPHONE HCL 8 MG PO TABS: 8.0000 mg | ORAL_TABLET | Freq: Four times a day (QID) | ORAL | Status: DC | PRN

## 2016-01-31 NOTE — Telephone Encounter (Signed)
Please see message, pt requesting refill on Hydromorphone.

## 2016-01-31 NOTE — Telephone Encounter (Signed)
Pt request refill  HYDROmorphone (DILAUDID) 8 MG tablet

## 2016-01-31 NOTE — Telephone Encounter (Signed)
Script is ready for pick up and I spoke with pt.  

## 2016-01-31 NOTE — Telephone Encounter (Signed)
This was done already today

## 2016-01-31 NOTE — Telephone Encounter (Signed)
done

## 2016-02-28 ENCOUNTER — Encounter: Payer: Self-pay | Admitting: Family Medicine

## 2016-02-28 NOTE — Telephone Encounter (Signed)
We do not scan or email documents. I will need an original document to sign it

## 2016-03-03 ENCOUNTER — Telehealth: Payer: Self-pay | Admitting: Family Medicine

## 2016-03-03 MED ORDER — HYDROMORPHONE HCL 8 MG PO TABS: 8.0000 mg | ORAL_TABLET | Freq: Four times a day (QID) | ORAL | 0 refills | Status: AC | PRN

## 2016-03-03 NOTE — Telephone Encounter (Signed)
Pt need to have Rx faxed with DMV paperwork.  Pt state that you all have the fax information.  If you should have any questions pt state you can call him.

## 2016-03-03 NOTE — Telephone Encounter (Signed)
I left a voice message with below information. 

## 2016-03-03 NOTE — Telephone Encounter (Signed)
The form and the rx are ready

## 2016-04-17 ENCOUNTER — Other Ambulatory Visit: Payer: Self-pay | Admitting: Family Medicine

## 2016-04-17 ENCOUNTER — Encounter: Payer: Self-pay | Admitting: Family Medicine

## 2016-04-18 NOTE — Telephone Encounter (Signed)
Ask him if he has moved to [place] yet. If he has he will need to find anew PCP there to do the referral. It would not make sense for us to get involved in all this if we will never see him again

## 2017-04-16 ENCOUNTER — Encounter: Payer: Self-pay | Admitting: Family Medicine
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
## 2019-07-06 LAB — COVID-19: SARS COV2, NAA (BD): NOT DETECTED

## 2019-07-07 NOTE — Case Communication (Signed)
Discharge Follow-Up Form - Text       Discharge Follow-Up Entered On:  07/11/2019 13:02 EST    Performed On:  07/11/2019 13:02 EST by Kinnie Scales, RN, [place]               Clinical   Provider Follow-Up Post Discharge RTF :   Follow-Up Appointments    With:  JESSICA HUND-MD  Address:  business (1), [address], [place], [place], [address];[phone] Business (1)       Kinnie Scales, RN, [place] - 07/11/2019 13:02 EST   Status   Case Status :   Incomplete   Phone Call History Post DC (Readmit) :   First call   TCM Call Outcome :   Message left   [place], RN, Massie Bougie - 07/11/2019 13:02 EST

## 2019-07-07 NOTE — Case Communication (Signed)
Discharge Follow-Up Form - Text       Discharge Follow-Up Entered On:  07/11/2019 15:33 EST    Performed On:  07/11/2019 15:33 EST by Arlyss Repress, RN, Cathlyn Parsons               Clinical   Provider Follow-Up Post Discharge RTF :   Follow-Up Appointments    With:  JESSICA HUND-MD  Address:  business (1), [address], [place], [place], [address];[phone] Business (1)       Arlyss Repress, RN, Cathlyn Parsons - 07/11/2019 15:33 EST   Surgery Evaluation   CM Surg DC Instr Clr :   Yes   CM Surg Pain Controlled :   Yes   CM Surg Nausea/Vomiting :   No   CM Surg FU Appt :   Yes   CM Surg Staff Recognize :   No   Arlyss Repress RNCathlyn Parsons - 07/11/2019 15:33 EST   Status   Case Status :   Completed   Arlyss Repress RNCathlyn Parsons - 07/11/2019 15:33 EST

## 2019-07-07 NOTE — Op Note (Signed)
Operative Report               Date of service  July 07, 2019    Preoperative diagnosis  Right shoulder complete subscapularis tear  Right shoulder intra-articular synovitis anterior, posterior, inferior and superior  Right shoulder degenerative labral tearing anteriorly, superiorly and posteriorly  Right shoulder subacromial impingement syndrome       Postoperative diagnosis  Same as above       Procedure  Right shoulder repair, subscapularis tear, CPT code [id number]  Right shoulder subacromial decompression CPT code [id number]  Right shoulder debridement of synovitis anteriorly and superiorly CPT code 29822  Right shoulder debridement of degenerative labral tearing, CPT code 29822       Surgeon  Dr. Mickie Kay       Assistant  Mateo Flow, PA-C       Anesthesia  Gen. with interscalene block       Anesthesiologist  Dr. Oleh Genin       Estimated blood loss  None       Specimen  None       Complications  None         Indication  This is a 63 year old male who suffers long-standing pain in the right shoulder that has been unresponsive to conservative management. MRI shows subscapularis rotator cuff tearing, subacromial impingement and biceps pathology. The patient understands the risks and benefits of surgery and has signed the consent and wishes to proceed.       Findings  Exam under anesthesia showed full range of motion without adhesions or instability.  The rotator cuff showed full thickness full width subscapularis tear with retraction.  The remainder the rotator cuff was intact  Biceps tendon showed anatomic variant with encapsulation from the superior synovium, but no tearing.  Humeral head was normal.  Glenoid chondral surface was normal.  Labrum showed degenerative labral fraying anteriorly, posteriorly and superiorly.  Rotator Interval was normal.  Capsular structures showed significant synovitis anteriorly and superiorly.  Subacromial bursal sac showed moderate thickening.  Acromion was a Type  2.  AC joint was normal       Implants  Arthrex 4.75 mm bio composite swivel locks X 2 with suture tape and FiberWire for subscapularis repair       Procedure  My physician assistant Mateo Flow, PA-C was medically necessary for successful implementation and completion of this case. She was involved with the entirety of the case including assessment and intake of the patient in the holding room, safe transfer of the patient to the operating room table. She was utilized and assisted in prepping and draping. But most importantly she performed arthroscopic procedures that are not typical of an untrained surgical assistant. She assisted and manipulated the arthroscope as well as arthroscopic graspers, biters and suture passers allowing me to do my portion of the surgery arthroscopically with more efficiency. She was also involved with closure of the incisions, dressings and safe transfer of the patient to the holding area. Because of her assistance, the surgical case progressed more efficiently and the patient was exposed to less general anesthesia, less bleeding and less time of having open incisions.  The patient was brought to the operating room and induced with general anesthesia after receiving an interscalene block. IV antibiotics were given and DVT prophylaxis was applied to the legs. A timeout was called and confirmed. Exam under anesthesia was performed. The patient???s arm was then double prepped and draped in the usual sterile manner. Standard  anterior and posterior portals were established into the joint space. The joint was examined systematically and the findings are noted above. Debridement was carried out in the joint with a combination of a 5.0 mm shaver and a 90 degree ArthroCare wand.  Synovium was debrided anteriorly, posteriorly, inferiorly and superiorly.  The labrum was debrided circumferentially.  The biceps was examined, as well as its attachment to the superior labrum.       1 cannula was  placed at the anterior mid glenoid area to allow for access to the subscapularis.  The lesser tuberosity was debrided with the shaver to stimulate healing.  With a fast pass suture passing instrument and suture tape and FiberWire were passed through the lower subscapularis tear.  We then loaded up the swivel lock in the anterior cannula and fixed the sutures to the lesser tuberosity.  We then performed the exact same procedure with suture tape and fiber tape through the anterior aspect of the subscapularis and fixed it down with a swivel lock to the lesser tuberosity more proximally.  We subsequently took the stay suture and passed it through the upper subscapularis and tied this down to bring the upper edge of the tendon down to bone.  Final pictures were taken of the repair.  Once completed within the joint we went into the subacromial space. Debridement of the bursa was carried out with the shaver and ArthroCare wand.  [Soft tissue impingement was noted and bursitis was found within the subacromial space.  Therefore, utilizing a 5 mm of bone cutter through a posterior and lateral portal, soft tissue decompression was performed and a gentle undersurface smoothing of the inferior acromion done.  The acromioclavicular joint was examined and found to be within normal limits.  We irrigated out the subacromial space and placed a pain pump catheter under direct visualization into the space. We primed the catheter and closed the portals with 3-0 nylon and large Steri-Strips. The wounds were dressed sterilely and the arm was placed in a sling. We then brought the patient to the recovery room in stable condition. Sponge and needle counts were correct.      Signature Line     Electronically Signed on 07/07/2019 12:13 PM EST   ________________________________________________   Edwin Cap

## 2019-07-07 NOTE — Op Note (Signed)
Phase I Record - [facility]             Phase I Record - [facility] Summary                                                                   Primary Physician:        Weston Brass R    Case Number:              [id number]    Finalized Date/Time:      07/07/19 13:20:09    Pt. Name:                 Dominic Knight, Dominic Knight    D.O.B./Sex:               July 17, 1956    Male    Med Rec #:                [id number]    Physician:                Weston Brass R    Financial #:              [id number]    Pt. Type:                 S    Room/Bed:                 /    Admit/Disch:              07/07/19 08:24:00 -    Institution:       [facility] Case Attendance - Phase I                                                                                            Entry 1                         Entry 2                                                                          Case Attendee             DEMARCO-MD,  Evern Core, RN, Milas Gain    Role Performed            Surgeon Primary                 Post Anesthesia Care  Nurse    Time In     Time Out     Last Modified By:         Nedra Hai, RN, Domingo Mend, RNYetta Barre                              07/07/19 12:13:18               07/07/19 12:13:18      [facility] - Case Times - Phase I                                                                                               Entry 1                                                                                                          Phase I In                07/07/19 12:05:00               Phase I Out                     07/07/19 12:35:00    Phase I Discharge         07/07/19 12:35:00    Time     Last Modified By:         Nedra Hai, RN, Yetta Barre                              07/07/19 13:20:08              Finalized By: Nedra Hai RN, Yetta Barre      Document Signatures                                                                             Signed By:           Nedra Hai RN, Yetta Barre 07/07/19 13:20
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
## 2019-07-07 NOTE — Op Note (Signed)
Phase II Record - [facility]             Phase II Record - [facility] Summary                                                                  Primary Physician:        Weston Brass R    Case Number:              [id number]    Finalized Date/Time:      07/07/19 13:25:55    Pt. Name:                 Dominic Knight, Dominic Knight    D.O.B./Sex:               11/30/1955    Male    Med Rec #:                [id number]    Physician:                Weston Brass R    Financial #:              [id number]    Pt. Type:                 S    Room/Bed:                 /    Admit/Disch:              07/07/19 08:24:00 -    Institution:       [facility] Case Attendance - Phase II                                                                                           Entry 1                                                                                                          Case Attendee             Truman Hayward, RN, Milas Gain                 Role Performed                  Post Anesthesia Care  Nurse    Last Modified By:         Nedra Hai, RN, Yetta Barre                              07/07/19 13:25:31      [facility] - Case Times - Phase II                                                                                              Entry 1                                                                                                          Phase II In               07/07/19 12:35:00               Phase II Out                    07/07/19 13:22:00    Phase II Discharge        07/07/19 13:22:00    Time     Last Modified By:         Nedra Hai, RN, Yetta Barre                              07/07/19 13:25:49              Finalized By: Nedra Hai RN, Yetta Barre      Document Signatures                                                                             Signed By:           Nedra Hai RN, Yetta Barre 07/07/19 13:25

## 2019-07-07 NOTE — Assessment & Plan Note (Signed)
PreOp Record - [facility]             PreOp Record - [facility] Summary                                                                     Primary Physician:        Katha Hamming R    Case Number:              Laverda Sorenson    Finalized Date/Time:      07/07/19 09:41:43    Pt. Name:                 Dominic Knight, Dominic Knight    D.O.B./Sex:               Aug 20, 1955    Male    Med Rec #:                [id number]    Physician:                Katha Hamming R    Financial #:              [id number]    Pt. Type:                 S    Room/Bed:                 /    Admit/Disch:              07/07/19 08:24:00 -    Institution:       [facility] Case Attendance - PreOp                                                                                              Entry 1                         Entry 2                                                                          Case Attendee             DEMARCO-MD,  Dola Argyle            JEFFCOAT, RN, CONSTANCE    Role Performed            Surgeon Primary                 Preoperative Nurse    Time In     Time Out     Last Modified  By:         Babs Bertin, RN, CONSTANCE         JEFFCOAT, RN, CONSTANCE                              07/07/19 08:55:45               07/07/19 08:55:45      [facility] - Case Times - PreOp                                                                                                 Entry 1                                                                                                          Patient In Room Time      07/07/19 08:40:00               Nurse In Time                   07/07/19 09:03:00    Nurse Out Time            07/07/19 09:41:00               Patient Ready for               07/07/19 09:41:00                                                              Surgery/Procedure     Last Modified By:         JEFFCOAT, RN, CONSTANCE                              07/07/19 09:41:42      [facility] - Case Times - PreOp Audit                                                                   07/07/19 09:41:42         Owner: JEFFCO  Modifier: [email or web address]                                                        <+> 1         Patient Ready for Surgery/Procedure        <+> 1         Nurse Out Time     07/07/19 09:03:18         Owner: JEFFCO                               Modifier: [email or web address]                                                        <+> 1         Nurse In Time                Finalized By: JEFFCOAT, RN, CONSTANCE      Document Signatures                                                                             Signed By:           JEFFCOAT, RN, CONSTANCE 07/07/19 09:41

## 2019-07-07 NOTE — Nursing Note (Signed)
Nursing Discharge Summary - Text       Nursing Discharge Summary Entered On:  07/07/2019 12:17 EST    Performed On:  07/07/2019 12:15 EST by Nedra Hai, RN, Yetta Barre               DC Information   Discharge To :   Home independently   Accompanied By :   Beverly Milch, RN, Yetta Barre - 07/07/2019 12:15 EST

## 2019-07-07 NOTE — Anesthesia Pre-Procedure Evaluation (Signed)
Postanesthesia Evaluation        Patient:   Dominic Knight, Dominic Knight             MRN: [id number]            FIN: [id number]               Age:   63 years     Sex:  Male     DOB:  Sep 26, 1955   Associated Diagnoses:   None   Author:   Felicity Coyer,  Jamyrah Saur JR      Postoperative Information   Post Operative Info:          Post operative day: Post Anesthesia Care Unit.         Patient location: PACU.       Assessment   Postanesthesia assessment   Vitals: Vital Signs   29/51/8841 66:06 EST Systolic Blood Pressure 301 mmHg    Diastolic Blood Pressure 56 mmHg  LOW    Temperature Oral 36.9 degC    Heart Rate Monitored 103 bpm  HI    Respiratory Rate 14 br/min    SpO2 94 %   60/04/9322 5:57 EST Systolic Blood Pressure 322 mmHg    Diastolic Blood Pressure 77 mmHg    Temperature Oral 37 degC    Heart Rate Monitored 84 bpm    Respiratory Rate 16 br/min    SpO2 96 %      .     Signature Line     Electronically Signed on 07/07/2019 12:12 PM EST   ________________________________________________   Jolaine Click

## 2019-07-07 NOTE — Procedures (Signed)
 IntraOp Record - [facility]             IntraOp Record - [facility] Summary                                                                   Primary Physician:        GAYLIN AGENT R    Case Number:              [id number]    Finalized Date/Time:      07/07/19 12:08:31    Pt. Name:                 Dominic Knight, Dominic Knight    D.O.B./Sex:               [birth year]    Male    Med Rec #:                [id number]    Physician:                GAYLIN AGENT R    Financial #:              [id number]    Pt. Type:                 S    Room/Bed:                 /    Admit/Disch:              07/07/19 08:24:00 -    Institution:       [facility] - Case Times                                                                                                         Entry 1                                                                                                          Patient      In Room Time             07/07/19 10:25:00               Out Room Time                   07/07/19 12:04:00    Anesthesia     Procedure  Start Time               07/07/19 10:51:00               Stop Time                       07/07/19 12:00:00    Last Modified By:         DEBBY PEAK, DEWEY C                              07/07/19 12:07:55      [facility] - Case Times Audit                                                                          07/07/19 12:07:55         Owner: [id number]                              Modifier: [email or web address]                                                        <+> 1         Out Room Time        <+> 1         Stop Time        [facility] - Case Attendance                                                                                                    Entry 1                         Entry 2                         Entry 3                                          Case Attendee             SOKEVITZ-MD,  DAVID JR          DEMARCO-MD,  LYNWOOD JONELLE Hurst, RN, Zoe C    Role Performed            Anesthesiologist  Surgeon Primary                  Circulator    Time In                   07/07/19 10:25:00               07/07/19 10:25:00               07/07/19 10:25:00    Time Out                  07/07/19 12:04:00               07/07/19 12:04:00               07/07/19 12:04:00    Procedure                 Shoulder Arthroscopy            Shoulder Arthroscopy            Shoulder Arthroscopy                              with Repair(Right)              with Repair(Right)              with Repair(Right)    Last Modified By:         DEBBY RN, WAYLAN JAYSON DEBBY, RN, DEWEY JAYSON DEBBY, RN, Cabery C                              07/07/19 12:08:26               07/07/19 12:08:26               07/07/19 12:08:26                                Entry 4                         Entry 5                         Entry 6                                          Case Attendee             Fernande Nian              MACKORELL-PA,  [place], RN, TINNIE CHRISTELLA CHRISTELLA    Role Performed  Surgical Scrub                  First Assistant                 Circulator Add'l    Time In                   07/07/19 10:25:00               07/07/19 10:25:00               07/07/19 10:25:00    Time Out                  07/07/19 12:04:00               07/07/19 12:04:00               07/07/19 10:28:00    Procedure                 Shoulder Arthroscopy            Shoulder Arthroscopy            Shoulder Arthroscopy                              with Repair(Right)              with Repair(Right)              with Repair(Right)    Last Modified By:         DEBBY RN, WAYLAN JAYSON DEBBY, RN, DEWEY JAYSON DEBBY, RN, DEWEY C                              07/07/19 12:08:26               07/07/19 12:08:26               07/07/19 12:08:26                                Entry 7                                                                                                          Case Attendee              DEBBY RN, DEWEY C    Role Performed            Circulator Relief    Time In                   07/07/19 11:47:00    Time Out                  07/07/19 12:04:00  Procedure                 Shoulder Arthroscopy                              with Repair(Right)    Last Modified By:         DEBBY RN, DEWEY C                              07/07/19 12:08:26    General Comments:            GARNETTE CROFT - ARTHREX REP      [facility] - Case Attendance Audit                                                                     07/07/19 12:08:26         Owner: [id number]                              Modifier: [email or web address]                                                            1     <+> Time Out            1     <*> Procedure                              Shoulder Arthroscopy with Repair(Right)            2     <+> Time Out            2     <*> Procedure                              Shoulder Arthroscopy with Repair(Right)            3     <+> Time Out            3     <*> Procedure                              Shoulder Arthroscopy with Repair(Right)            4     <+> Time Out            4     <*> Procedure                              Shoulder Arthroscopy with Repair(Right)            5     <+> Time Out            5     <*>  Procedure                              Shoulder Arthroscopy with Repair(Right)            6     <*> Procedure                              Shoulder Arthroscopy with Repair(Right)            7     <+> Time Out            7     <*> Procedure                              Shoulder Arthroscopy with Repair(Right)     07/07/19 11:50:27         Owner: [id number]                              Modifier: [id number]                                                           7     <*> Case Attendee                          DEBBY, RN, DEWEY C            7     <*> Role Performed            7     <*> Time In            7     <*> Procedure     07/07/19 11:33:39         Owner: [id number]                              Modifier:  [id number]                                                           1     <*> Procedure            1     <*> Procedure                              Shoulder Arthroscopy with Repair(Right)            1     <*> Procedure                              Shoulder Arthroscopy with Repair(Right)            2     <*> Procedure            2     <*>  Procedure                              Shoulder Arthroscopy with Repair(Right)            2     <*> Procedure                              Shoulder Arthroscopy with Repair(Right)            3     <*> Procedure            3     <*> Procedure                              Shoulder Arthroscopy with Repair(Right)            3     <*> Procedure                              Shoulder Arthroscopy with Repair(Right)            4     <*> Procedure            4     <*> Procedure                              Shoulder Arthroscopy with Repair(Right)            4     <*> Procedure                              Shoulder Arthroscopy with Repair(Right)            5     <*> Procedure            5     <*> Procedure                              Shoulder Arthroscopy with Repair(Right)            5     <*> Procedure                              Shoulder Arthroscopy with Repair(Right)            6     <*> Time In            6     <*> Procedure            6     <*> Procedure                              Shoulder Arthroscopy with Repair(Right)            6     <*> Procedure                              Shoulder Arthroscopy with Repair(Right)     07/07/19 11:32:52         Owner: [id number]  Modifier: [id number]                                                           6     <*> Case Attendee                          WARRICK RN, TINNIE HERO            6     <*> Role Performed            6     <*> Time Out            6     <+> Procedure     07/07/19 11:32:21         Owner: [id number]                              Modifier: [id number]                                                           1      <*> Procedure            1     <*> Procedure                              Shoulder Arthroscopy with Repair(Right)            1     <*> Procedure                              Shoulder Arthroscopy with Repair(Right)            1     <*> Procedure                              Shoulder Arthroscopy with Repair(Right)            2     <*> Procedure            2     <*> Procedure                              Shoulder Arthroscopy with Repair(Right)            2     <*> Procedure                              Shoulder Arthroscopy with Repair(Right)            2     <*> Procedure                              Shoulder Arthroscopy with Repair(Right)  3     <*> Time In            3     <*> Time In            3     <*> Procedure            3     <*> Procedure                              Shoulder Arthroscopy with Repair(Right)            3     <*> Procedure                              Shoulder Arthroscopy with Repair(Right)            3     <*> Procedure                              Shoulder Arthroscopy with Repair(Right)            4     <*> Time In            4     <*> Time In            4     <*> Procedure            4     <*> Procedure                              Shoulder Arthroscopy with Repair(Right)            4     <*> Procedure                              Shoulder Arthroscopy with Repair(Right)            4     <*> Procedure                              Shoulder Arthroscopy with Repair(Right)            5     <*> Time In            5     <*> Time In            5     <*> Procedure            5     <*> Procedure                              Shoulder Arthroscopy with Repair(Right)            5     <*> Procedure                              Shoulder Arthroscopy with Repair(Right)            5     <*> Procedure  Shoulder Arthroscopy with Repair(Right)     07/07/19 10:59:30         Owner: [id number]                              Modifier: [id number]                                                            1     <*> Time In            1     <*> Time In            1     <*> Time In            1     <*> Procedure            1     <*> Procedure                              Shoulder Arthroscopy with Repair(Right)            1     <*> Procedure                              Shoulder Arthroscopy with Repair(Right)            1     <*> Procedure                              Shoulder Arthroscopy with Repair(Right)            1     <*> Procedure                              Shoulder Arthroscopy with Repair(Right)            2     <*> Time In            2     <*> Time In            2     <*> Time In            2     <*> Procedure            2     <*> Procedure                              Shoulder Arthroscopy with Repair(Right)            2     <*> Procedure                              Shoulder Arthroscopy with Repair(Right)            2     <*> Procedure  Shoulder Arthroscopy with Repair(Right)            2     <*> Procedure                              Shoulder Arthroscopy with Repair(Right)            3     <*> Case Attendee                          Shona, RN, Zoe C            3     <*> Case Attendee                          Shona, RN, Zoe C            3     <*> Case Attendee                          Shona, RN, Zoe C            3     <*> Role Performed            3     <*> Role Performed            3     <*> Role Performed            3     <*> Procedure            4     <*> Case Attendee                          Fernande Nian            4     <*> Case Attendee                          Fernande Nian            4     <*> Case Attendee                          Fernande Nian            4     <*> Role Performed            4     <*> Role Performed            4     <*> Role Performed            4     <*> Procedure            5     <*> Case Attendee                          MACKORELL-PA,  CHRISTEN M            5     <*> Case Attendee                          MACKORELL-PA,  CHRISTEN  M            5     <*>  Case Attendee                          MACKORELL-PA,  CHRISTEN M            5     <*> Role Performed            5     <*> Role Performed            5     <*> Role Performed            5     <*> Procedure        [facility] - Skin Assessment                                                                          Pre-Care Text:            A.240 Assesses baseline skin condition Im.120 Implements protective measures to prevent skin or tissue injury           due to mechanical sources  Im.280.1 Implements progective measures to prevent skin or tissue injury due to           thermal sources Im.360 Monitors for signs and symptons of infection                              Entry 1                                                                                                          Skin Integrity            Intact    Last Modified By:         Shona, RN, Zoe C                              07/07/19 11:03:14    Post-Care Text:            E.10 Evaluates for signs and symptoms of physical injury to skin and tissue E.270 Evaluate tissue perfusion           O.60 Patient is free from signs and symptoms of injury caused by extraneous objects   O.210 Patinet's tissue           perfusion is consistent with or improved from baseline levels      [facility] - Patient Positioning  Pre-Care Text:            A.240 Assesses baseline skin condition A.280 Identifies baseline musculoskeletal status A.280.1 Identifies           physical alterations that require additional precautions for procedure-specific positioning A.510.8 Maintains           patient's dignity and privacy Im.120 Implements protective measures to prevent skin/tissue injury due to           mechanical sources Im.40 Positions the patient Im.80 Applies safety devices                              Entry 1                                                                                                           Procedure                 Shoulder Arthroscopy            Body Position                   Beach Chair                              with Repair(Right)    Left Arm Position         Resting at Side and             Right Arm Position              Secured in Limb                              Padded w/Security Strap                                         Positioner    Left Leg Position         Flexed, Padding Under           Right Leg Position              Flexed, Padding Under                              Knee                                                            Knee    Feet Uncrossed            Yes  Pressure Points                 Yes                                                              Checked     Additional                Patient in Sitting              Positioning Device              [facility] Chair/Shoulder    Information               Position with Wedge                                             Table, Safety Strap,                              Support under legs                                              Foam Wedge, Limb                              Heels off mattress                                              Positioner, Foam Padding                              Safety Strap across lap    Positioned By             SOKEVITZ-MD,  DAVID JR,         Outcome Met (O.80)              Yes                              DEMARCO-MD,  LYNWOOD JONELLE Hurst, RN, Zoe C,                              MACKORELL-PA,  CHRISTEN                              M    Last Modified By:         Hurst, RN, Zoe C  07/07/19 11:36:07    Post-Care Text:            A.240 Assesses baseline skin condition A.280 Identifies baseline musculoskeletal status A.280.1 Identifies           physical alterations that require additional precautions for procedure-specific positioning A.510.8 Maintains           patient's dignity and privacy Im.120 Implements  protective measures to prevent skin/tissue injury due to           mechanical sources Im.40 Positions the patient Im.80 Applies safety devices      [facility] - Patient Positioning Audit                                                                 07/07/19 11:36:07         Owner: [id number]                              Modifier: [id number]                                                           1     <*> Right Leg Position                     Pillow Under Knee            1     <*> Left Leg Position                      Pillow Under Knee            1     <*> Procedure                              Shoulder Arthroscopy with Repair(Right)        [facility] - Skin Prep                                                                                Pre-Care Text:            A.30 Verifies allergies A.20 Verifies procedure, surgical site, and laterality A.510.8 Maintains paritnet's           dignity and privacy Im.270 Performs Skin Preparation Im.270.1 Implements protective measures to prevent skin           and tissue injury due to chemical sources  A.300.1 Protects from cross-contamination                              Entry 1  Entry 2                         Entry 3                                          Hair Removal      Hair Removal By      Hair Removal Methods      Hair Removal Site      Hair Removal Site      Details     Skin Prep      Prep Agents (Im.270)     Chlorhexidine Scrub             Chlorhexidine Gluconate         Chlorhexidine Gluconate                              Sponge / Brush                  2% w/Alcohol                    2% w/Alcohol     Prep Area (Im.270)       Shoulder, Arm                   Shoulder, Arm                   Shoulder, Arm     Prep Area Details        Right                           Right                           Right     Prep By                  Shona, RN, Zoe C                 DEMARCO-MD,  Cimini R            MACKORELL-PA,  CHRISTEN                                                                                               M    Outcome Met (O.100)       Yes                             Yes                             Yes    Last Modified ByBETHA Shona, RN, Zoe C  Shona, RN, Santana JAYSON Shona, [place], Santana JAYSON                              07/07/19 11:05:33               07/07/19 11:05:33               07/07/19 11:05:33    Post-Care Text:            E.10 Evaluates for signs and symptoms of physical injury to skin and tissue O.100 Patient is free from signs           and symptoms of chemical injury  O.740 The patient's right to privacy is maintained      [facility] - Counts Initial and Final                                                                 Pre-Care Text:            A.20.2 - Assesses the risk for unintended retained surgical items Im.20 - Performs required counts                              Entry 1                                                                                                          Initial Counts      Initial Counts           Fernande Nian,             Items included in               Sponges, Sharps     Performed By             WARRICK, RN, TINNIE HERO        the Initial Count     Final Counts      Final Counts             Fernande Nian,             Final Count Status              Correct     Performed By             DEBBY, RN, DEWEY C     Items Included in        Sponges, Sharps     Final Count     Outcome Met (O.20)        Yes    Last Modified By:  DEBBY, RN, DEWEY C                              07/07/19 12:08:09    Post-Care Text:            E.50 - Evaluates results of the surgical count O.20 - Patient is free from unintended retained surgical items      RH[facility] - Counts Initial and Final Audit                                                            07/07/19 12:08:09         Owner: [id number]                              Modifier: [email or web address]                                                            1     <*> Final Counts  Performed By              Fernande Nian            1     <+> Final Count Status            1     <+> Outcome Met (O.20)     07/07/19 11:33:01         Owner: [id number]                              Modifier: [id number]                                                           1     <*> Initial Counts Performed By            Fernande Nian LOB - General Case Data                                                                        Pre-Care Text:            A.350.1 Classifies surgical wound                              Entry 1  Case Information      ASA Class                2                               Case Level                      Level 3     OR                       RH1 02                          Specialty                       Orthopedic (SN)     Wound Class              1-Clean    Preop Diagnosis           ACUTE RUPTURE OF                Postop Diagnosis                ACUTE RUPTURE OF                              SUBSCAPULARIS                                                   SUBSCAPULARIS    Last Modified ByBETHA Hurst, RN, Zoe C                              07/07/19 11:02:01    Post-Care Text:            O.760 Patient receives consistent and comparable care regardless of the setting      [facility] - Fire Risk Assessment                                                                                               Entry 1  Fire Risk                 Alcohol Based Prep              Fire Risk Score                 3    Assessment: If            Solution, Surgical Site    checked, checkmark        Above Xiphoid, Ignition    = 1 point                 Source In Use    Last Modified By:         Shona, RN, Zoe C                              07/07/19 11:02:09      RH[facility] - Time Out                                                                                  Pre-Care Text:            A.10 Confirms patient identity A.20 Verifies operative procedure, surgical site, and laterality A.20.1 Verifies           consent for planned procedure A.30 Verifies allergies                              Entry 1                                                                                                          Procedure                 Shoulder Arthroscopy            Is everyone ready               Yes                              with Repair(Right)              to perform time out     Have all members of       Yes                             Patient name and                Yes    the surgical team  DOB confirmed     been introduced     Allergies discussed       Yes                             Surgical procedure              Yes                                                              to be performed                                                               confirmed and                                                               verified by                                                               completed surgical                                                               consent     Correct surgical          Yes                             Correct laterality              Yes    site marked and                                           confirmed     initials are     visible through     prepped and draped     field (or     alternative ID band     used)     Correct patient           Yes                             Surgeon shares                  Yes    position confirmed  operative plan,                                                               possible                                                               difficulties,                                                               expected duration,                                                                anticipated blood                                                               loss and reviews                                                               all                                                               critical/specific                                                               concerns     Required blood            Yes                             Essential imaging               Yes    products, implants,  available and fetal     devices and/or                                            heartones confirmed     special equipment                                         (if applicable)     available and     sterility confirmed     VTE prophylaxis           Yes                             Antibiotics ordered             Yes    addressed                                                 and administered     Anesthesia shares         Yes                             Fire risk                       Yes    anesthetic plan and                                       assessment scored     reviews patient                                           and plan discussed     specific concerns     Appropriate drying        Yes                             Surgeon states:                 Yes    time for prep                                             Does anyone have     observed before                                           any concerns? If     draping  you see, suspect,                                                               or feel that                                                               patient care is                                                               being compromised,                                                               speak up for                                                               patient safety     Time Out Complete         07/07/19  10:49:00    Last Modified By:         Shona, RN, Zoe C                              07/07/19 11:02:54    Post-Care Text:            E.30 Evaluates verification process for correct patient, site, side, and level surgery      [facility] - Debrief                                                                                  Pre-Care Text:            Im.330 Manages specimen handling and disposition                              Entry 1  Procedure                 Shoulder Arthroscopy            Final counts                    Yes                              with Repair(Right)              correct and                                                               verbally verified                                                               with                                                               surgeon/licensed                                                               independent                                                               practitioner (if                                                               applicable)     Actual procedure          Yes                             Postop diagnosis                Yes    performed confirmed                                       confirmed     Wound  Yes                             Confirm specimens               Yes    classification                                            and specimens     confirmed                                                 labeled                                                               appropriately (if                                                               applicable)     Foley catheter            Yes                             Patient recovery                Yes    removed (if                                               plan confirmed     applicable)     Debrief Complete           07/07/19 12:00:00    Last Modified By:         DEBBY, RN, DEWEY C                              07/07/19 12:08:24    Post-Care Text:            E.800 Ensures continuity of care E.50 Evaluates results of the surgical count O.30 Patient's procedure is           performed on the correct site, side, and level O.50 patient's current status is communicated throughout the           continuum of care O.40 Patient's specimen(s) is managed in the appropriate manner      [facility] - Debrief Audit  07/07/19 12:08:24         Owner: [id number]                              Modifier: [email or web address]                                                            1     <*> Procedure                              Shoulder Arthroscopy with Repair(Right)            1     <+> Debrief Complete        [facility] - Cautery                                                                                  Pre-Care Text:            A.240 Assesses baseline skin condition A280.1 Identifies baseline musculoskeletal status Im.50 Implements           protective measures to prevent injury due to electrical sources  Im.60 Uses supplies and equipment within safe           parameters Im.80 Applies safety devices                              Entry 1                                                                                                          ESU Type                  GENERATOR ARTHROWAND            Identification                  [id number]                                                              Number     Coag Setting (watts)      2  Cut Setting (watts)             7    Grounding Pad             No                              Outcome Met (O.10)              Yes    Needed?     Last Modified By:         Shona, RN, Zoe C                              07/07/19 11:13:30    Post-Care Text:            E.10 Evaluates for signs and symptoms of physical injury to skin and  tissue O.10 Patient is free from signs and           symptoms of injury related to thermal sources  O.70 Patient is free from signs and symptoms of electrical injury      [facility] - Patient Care Devices                                                                     Pre-Care Text:            A.200 Assesses risk for normothermia regulation A.40 Verifies presence of prosthetics or corrective devices           Im.280 Implements thermoregulation measures Im.60 Uses supplies and equipment within safe parameters                              Entry 1                         Entry 2                                                                          Equipment Type            MACHINE SEQUENTIAL              BAIR HUGGER                              COMPRESSION    SCD Sleeve Site           Legs Bilateral    Equipment/Tag Number      [id number]                       [id number]    Initiated Pre             Yes    Induction     Last Modified By:  [address], RN, Zoe JAYSON Hurst, [place], Santana JAYSON                              07/07/19 11:08:45               07/07/19 11:08:45    Post-Care Text:            E.10 Evaluates signs and symptoms of physical injury to skin and tissue O.60 Patient is free from signs and           symptoms of injury caused by extraneous objects      [facility] - Surgical Fluid Management                                                                                          Entry 1                                                                                                          Irrigant                  Lactated ringers                 Irrigant Volume In              12000 mL    Irrigant Volume Out       12000 mL                        Additive                        1 ML OF EPI PER                                                                                              OF LR    Outcome Met (O.300)       Yes    Last Modified ByBETHA Hurst, RN, Zoe C  07/07/19  11:49:19      [facility] - Surgical Fluid Management Audit                                                           07/07/19 11:49:19         Owner: [id number]                              Modifier: [id number]                                                       <+> 1         Irrigant Volume In        <+> 1         Irrigant Volume Out        <+> 1         Outcome Met (O.300)        [facility] - Medications                                                                              Pre-Care Text:            A.10 Confirms patient identity A.30 Verifies allergies Im.220 Administers prescribed medications Im.220.2           Administers prescribed antibiotic therapy as ordered                              Entry 1                         Entry 2                         Entry 3                                          Time Administered     Medication                EPINEPRINE 1:1000 - 30CC        LIDOCAINE  1% W/EPI         ROPIVACAINE  HCL                                                              VIAL  INJECTION 5MG /ML  30 ML    Route of Admin            Irrigation                      Local Injection                 Local Injection    Dose/Volume               12 ML                           20 ML                           5 ML    (include amount and     unit of measure)     Site                      Shoulder                        Shoulder                        Shoulder    Site Detail               Right                           Right                           Right    Administered By           DEMARCO-MD,  LYNWOOD SAUNDERS            DEMARCO-MD,  Boxell R            MACKORELL-PA,  CHRISTEN                                                                                              M    Outcome Met (O.130)       Yes                             Yes                             Yes    Last Modified ByBETHA Hurst, RN, Santana JAYSON Hurst, RN, Zoe JAYSON Hurst, RN, Zoe C  07/07/19 11:50:08               07/07/19 11:35:30               07/07/19 11:18:15    Post-Care Text:            E.20 Evaluates response to medications O.130 Patient receives appropriately administerd medication(s)    General Comments:            NAROPIN  USED TO PRIME ONQ      [facility] - Medications Audit                                                                         07/07/19 11:50:08         Owner: [id number]                              Modifier: [id number]                                                           1     <*> Medication                             EPINEPRINE 1:1000 - 30CC            1     <*> Medication                             EPINEPRINE 1:1000 - 30CC            1     <*> Medication                             EPINEPRINE 1:1000 - 30CC            1     <*> Dose/Volume (include amount and                      unit of measure)     07/07/19 11:35:30         Owner: [id number]                              Modifier: [id number]                                                           2     <*> Medication            2     <*> Medication            2     <*> Medication  LIDOCAINE  1% W/EPI VIAL            2     <*> Medication                             LIDOCAINE  1% W/EPI VIAL            2     <*> Medication                             LIDOCAINE  1% W/EPI VIAL            2     <*> Route of Admin            2     <*> Route of Admin        [facility] - Implants/Endoscopy Stents                                                                Pre-Care Text:            A.20 Verifies operative procedure, surgical site, and laterality A.20.1 Verifies consent for planned procedure           Im.350 Records implants inserted during the operative or invasive procedure                              Entry 1                                                                                                          Implant/Explant           Implant                         Catalog #                       [id number]    Implant     Identification      Description              [employer] BI[employer]OMPOSITE             Expiration Date                 01/25/23                              SWIVELOCK-C 4.75MM     Lot Number               [id number]  Ambulance person             RIGHT SHOULDER                  Quantity                        2    Last Modified ByBETHA Hurst, RN, Zoe C                              07/07/19 11:35:14    Post-Care Text:            E.30 Evaluates verification process for correct patient, site, side and level surgery O.30 Patient's procedure           is performed on the correct site, side, and level      [facility] - Implants/Endoscopy Stents Audit                                                           07/07/19 11:35:14         Owner: [id number]                              Modifier: [id number]                                                           1     <*> Description                            ANCHOR BIOCOMPOSITE SWIVELOCK-C 4.75MM            1     <*> Quantity                               1        [facility] - Communication                                                                            Pre-Care Text:            A.520 Identifies barriers to communication (Patient and Family Communications) A.20 Verifies operative           procedure, surgical site, and laterality Sports coach) Im.500 Provides status reports to family           members Im.150 Develops individualized plan of care  Entry 1                                                                                                          Communication             Face to Face                    Communication By                Shona, RN, Zoe C    Date and Time             07/07/19 10:45:00               Communication To                PATIENT AND WIFE IN                                                                                               PREOP    Last Modified By:         Shona, RN, Zoe C                              07/07/19 11:11:44    Post-Care Text:            E.520 Evaluates psychosocial response to plan of care O.500 Patient or designated support person demonstrates           knowledge of the expected psychosocial responses to the procedure E.800 Ensures continuity of care O.50           Patient's current status is communicated throughout the continuum of care      [facility] - Dressing/Packing                                                                         Pre-Care Text:            A.350 Assesses susceptibility for infection Im.250 Administers care to invasive devices Im.290 Administer care           to wound sites  Im.300 Implements aseptic technique                              Entry 1  Site                      Shoulder                        Site Details                    Right    Dressing Item     Details      Dressing Item            2x2's, 4x4's, ABD,              Miscellaneous                   Cold Therapy Pad,     (Im.290)                 Tape, Occlusive Dressing        (Im.290)                        Shoulder                                                                                              Immobilizer/Sling    Last Modified ByBETHA Hurst, RN, Zoe C                              07/07/19 11:11:16    Post-Care Text:            E.320 Evaluate factors associted with increased risk for postoperative infection at the completion of the           procedure O.200 Patient's wound perfusion is consistent with or improved from baseline levels  O.Patient is           free from signs and symptoms of infection      [facility] - Procedures                                                                               Pre-Care Text:            A.20 Verifies operative procedure, surgical site, and laterality Im.150  Develops individualized plan of care                              Entry 1  Procedure     Description      Procedure                Shoulder Arthroscopy            Modifiers                       Right                              with Repair     Surgical Procedure       RIGHT SHOULDER     Text                     ARTHROSCOPY, SAD, [facility]                              REPAIR    Primary Procedure         Yes                             Primary Surgeon                 CARRELL, RAHMANI    Start                     07/07/19 10:51:00               Stop                            07/07/19 12:00:00    Anesthesia Type           General                         Surgical Service                Orthopedic (SN)    Wound Class               1-Clean    Last Modified By:         DEBBY RN, WAYLAN C                              07/07/19 12:08:13    Post-Care Text:            O.730 The patinet's care is consistent with the individualized perioperative plan of care      [facility] - Procedures Audit                                                                          07/07/19 12:08:13         Owner: [id number]                              Modifier: [email or web address]                                                        <+>  1         Stop        [facility] - Transfer                                                                                                           Entry 1                                                                                                          Transferred By            ELEX ALM RADDLE,         Via                             Patricio Hurst, RN, Zoe C    Post-op Destination       PACU    Skin Assessment      Condition                Intact    Last Modified By:         Hurst, RN, Zoe C                              07/07/19 11:05:55      Case Comments                                                                                          <None>              Finalized By: DEBBY RN, WAYLAN C      Document Signatures  Signed By:           DEBBY, RN, DEWEY C 07/07/19 12:08

## 2019-08-19 ENCOUNTER — Encounter: Payer: Self-pay | Admitting: Gastroenterology

## 2020-01-09 LAB — HEMOGLOBIN A1C: Hemoglobin A1C, External: 6.3 % — ABNORMAL HIGH (ref 4.3–5.7)

## 2020-11-27 NOTE — ED Notes (Signed)
ED Patient Post Visit Phone Callback       ED Patient Post Visit Phone Callback Entered On:  11/28/2020 15:06 EDT    Performed On:  11/28/2020 15:06 EDT by Otilio Miu, RN, Forrest P               Patient Post Visit Phone Callback   ED Follow-up Call :   No answer/unable to reach   ED Date/Time Call Placed :   11/28/2020 15:04 EDT   ED Further Patient Follow-up :   No   Dwyer, RN, Forrest P - 11/28/2020 15:06 EDT

## 2020-11-27 NOTE — ED Notes (Signed)
ED Patient Education Note     Patient Education Materials Follows:  Ophthalmology     Allergic Conjunctivitis, Adult      Allergic conjunctivitis is inflammation of the conjunctiva. The conjunctiva is the thin, clear membrane that covers the white part of the eye and the inner surface of the eyelid. In this condition:   The blood vessels in the conjunctiva become irritated and swell.     The eyes become red or pink and feel itchy.      Allergic conjunctivitis cannot be spread from person to person. This condition can develop at any age and may be outgrown.      What are the causes?    This condition is caused by allergens. These are things that can cause an allergic reaction in some people but not in other people. Common allergens include:   Outdoor allergens, such as:  ? Pollen, including pollen from grass and weeds.    ? Mold spores.    ? Car fumes.       Indoor allergens, such as:  ? Dust.    ? Smoke.    ? Mold spores.    ? Proteins in a pet's urine, saliva, or dander.          What increases the risk?    You may be more likely to develop this condition if you have a family history of these things:   Allergies.     Conditions caused by being exposed to allergens, such as:  ? Allergic rhinitis. This is an allergic reaction that affects the nose.    ? Bronchial asthma. This condition affects the large airways in the lungs and makes breathing difficult.    ? Atopic dermatitis (eczema). This is inflammation of the skin that is long-term (chronic).          What are the signs or symptoms?    Symptoms of this condition include eyes that are:   Itchy.     Red.     Watery.     Puffy.      Your eyes may also:   Sting or burn.     Have clear fluid draining from them.     Have thick mucus discharge and pain (vernal conjunctivitis).        How is this diagnosed?    This condition may be diagnosed by:    Your medical history.      A physical exam.      Tests of the fluid draining from your eyes to rule out other causes.      Other tests to confirm the diagnosis, including:  ? Testing for allergies. The skin may be pricked with a tiny needle. The pricked area is then exposed to small amounts of allergens.    ? Testing for other eye conditions. Tests may include:  ? Blood tests.    ? Tissue scrapings from your eyelid. The tissue is then checked under a microscope.            How is this treated?      This condition may be treated with:   Cold, wet cloths (cold compresses) to soothe itching and swelling.     Washing the face to remove allergens.     Eye drops. These may be prescription or over-the-counter. You may need to try different types to see which one works best for you, such as:  ? Eye drops that block the allergic reaction (antihistamine).    ?  Eye drops that reduce swelling and irritation (anti-inflammatory).    ? Steroid eye drops, which may be given if other treatments have not worked (vernal conjunctivitis).       Oral antihistamine medicines. These are medicines taken by mouth to lessen your allergic reaction. You may need these if eye drops do not help or are difficult to use.        Follow these instructions at home:    Eye care     Apply a clean, cold compress to your eyes for 10?20 minutes, 3?4 times a day.     Do not touch or rub your eyes.     Do not wear contact lenses until the inflammation is gone. Wear glasses instead.     Do not wear eye makeup until the inflammation is gone.      General instructions     Avoid known allergens whenever possible.     Take or apply over-the-counter and prescription medicines only as told by your health care provider. These include any eye drops.     Drink enough fluid to keep your urine pale yellow.     Keep all follow-up visits as told by your health care provider. This is important.        Contact a health care provider if:     Your symptoms get worse or do not get better with treatment.     You have mild eye pain.     You become sensitive to light.     You have spots or blisters  on your eyes.     You have pus draining from your eyes.     You have a fever.      Get help right away if:     You have redness, swelling, or other symptoms in only one eye.     Your vision is blurred or you have other vision changes.     You have severe eye pain.      Summary     Allergic conjunctivitis is inflammation of the clear membrane that covers the white part of the eye and the inner surface of the eyelid.     Take or apply over-the-counter and prescription medicines only as told by your health care provider. These include eye drops.     Do not touch or rub your eyes.     Contact a health care provider if your symptoms get worse or do not get better with treatment.      This information is not intended to replace advice given to you by your health care provider. Make sure you discuss any questions you have with your health care provider.      Document Revised: 06/06/2019 Document Reviewed: 06/06/2019  Elsevier Patient Education ? 2021 [employer]      Orthopedics     Merchant navy officer cyst, also called a popliteal cyst, is a growth that forms at the back of the knee. The cyst forms when the fluid-filled sac (bursa) that cushions the knee joint becomes enlarged.      What are the causes?    In most cases, a Baker cyst results from another knee problem that causes swelling inside the knee. This makes the fluid inside the knee joint (synovial fluid) flow into the bursa behind the knee, causing the bursa to enlarge.      What increases the risk?    You may be more likely to develop a  Baker cyst if you already have a knee problem, such as:   A tear in cartilage that cushions the knee joint (meniscal tear).     A tear in the tissues that connect the bones of the knee joint (ligament tear).     Knee swelling from osteoarthritis, rheumatoid arthritis, or gout.        What are the signs or symptoms?    The main symptom of this condition is a lump behind the knee. This may be the only symptom of the  condition. The lump may be painful, especially when the knee is straightened. If the lump is painful, the pain may come and go. The knee may also be stiff.    Symptoms may quickly get more severe if the cyst breaks open (ruptures). If the cyst ruptures, you may feel the following in your knee and calf:   Sudden or worsening pain.     Swelling.     Bruising.     Redness in the calf.      A Baker cyst does not always cause symptoms.      How is this diagnosed?    This condition may be diagnosed based on your symptoms and medical history. Your health care provider will also do a physical exam. This may include:   Feeling the cyst to check whether it is tender.     Checking your knee for signs of another knee condition that causes swelling.      You may have imaging tests, such as:   X-rays.     MRI.     Ultrasound.        How is this treated?    A Baker cyst that is not painful may go away without treatment. If the cyst gets large or painful, it will likely get better if the underlying knee problem is treated.    If needed, treatment for a Baker cyst may include:   Resting.     Keeping weight off of the knee. This means not leaning on the knee to support your body weight.     Taking NSAIDs, such as ibuprofen, to reduce pain and swelling.     Having a procedure to drain the fluid from the cyst with a needle (aspiration). You may also get an injection of a medicine that reduces swelling (steroid).     Having surgery. This may be needed if other treatments do not work. This usually involves correcting knee damage and removing the cyst.        Follow these instructions at home:      Activity     Rest as told by your health care provider.     Avoid activities that make pain or swelling worse.     Return to your normal activities as told by your health care provider. Ask your health care provider what activities are safe for you.     Do not use the injured limb to support your body weight until your health care provider says  that you can. Use crutches as told by your health care provider.      General instructions     Take over-the-counter and prescription medicines only as told by your health care provider.     Keep all follow-up visits as told by your health care provider. This is important.        Contact a health care provider if:     You have knee pain, stiffness, or swelling that does  not get better.      Get help right away if:     You have sudden or worsening pain and swelling in your calf area.      Summary     A Baker cyst, also called a popliteal cyst, is a growth that forms at the back of the knee.     In most cases, a Baker cyst results from another knee problem that causes swelling inside the knee.     A Baker cyst that is not painful may go away without treatment.     If needed, treatment for a Baker cyst may include resting, keeping weight off of the knee, medicines, or draining fluid from the cyst.     Surgery may be needed if other treatments are not effective.      This information is not intended to replace advice given to you by your health care provider. Make sure you discuss any questions you have with your health care provider.      Document Revised: 11/26/2018 Document Reviewed: 11/26/2018  Elsevier Patient Education ? 2021 Elsevier Inc.

## 2020-11-27 NOTE — ED Provider Notes (Signed)
General Medical Problem *ED        Patient:   Dominic Knight, Dominic Knight            MRN: [id number]            FIN: [id number]               Age:   65 years     Sex:  Male     DOB:  [birth year]   Associated Diagnoses:   Conjunctivitis; Bakers cyst   Author:   Loleta Books      Basic Information   Time seen: Provider Seen (ST)   ED Provider/Time:    Shunte Senseney,  Aswad Wandrey-DO / 11/27/2020 14:03  .   Additional information: Chief Complaint from Nursing Triage Note   Chief Complaint  Chief Complaint: pain behind left knee yesterday that has progressed to pain and swelling of lower leg. Suspected blood clot per pt. (11/27/20 13:53:00).      History of Present Illness   65 year old male who recent returned from [place] 24 hours ago presents with concerns over some left calf pain.  Notes no difficulty breathing or chest pain no redness or warmth to the area of concern.  No fall trauma or injuries.  Otherwise well nonprogressive without a DVT has no further complaints or concerns.      Review of Systems   Constitutional symptoms:  No fever, no chills, no sweats, no generalized weakness, no fatigue, no decreased activity.    Skin symptoms:  No jaundice, no rash.    Eye symptoms:  Vision unchanged, Mild redness to the left eye.    ENMT symptoms:  Negative except as documented in HPI.   Respiratory symptoms:  Negative except as documented in HPI.   Cardiovascular symptoms:  No chest pain, no palpitations, no tachycardia, no syncope, no diaphoresis.    Gastrointestinal symptoms:  No abdominal pain, no nausea, no vomiting, no diarrhea.    Genitourinary symptoms:  Negative except as documented in HPI.   Musculoskeletal symptoms:  Muscle pain, no back pain, no Joint pain.    Neurologic symptoms:  No headache, no dizziness, no altered level of consciousness, no numbness, no tingling, no focal weakness.    Psychiatric symptoms:  No anxiety,              Additional review of systems information: All other systems reviewed and otherwise  negative.      Health Status   Allergies:    Allergic Reactions (Selected)  Moderate  Avelox- Angioedema and unknown.  Gabapentin- Jittery and unknown.  Omnicef- Unknown and angioedema.  Unknown  Compazine- Unknown and hallucinating.  HYDROcodone- Itching..      Past Medical/ Family/ Social History   Medical history: Reviewed as documented in chart.   Surgical history: Reviewed as documented in chart.   Family history: Not significant.   Social history: Reviewed as documented in chart.   Problem list:    Active Problems (10)  Anxiety   Depression   HTN (hypertension)   Hyperlipemia   Knee pain, bilateral   Low back pain   Neck pain   OA (osteoarthritis)   Right shoulder injury   Snores   .      Physical Examination               Vital Signs   Vital Signs   11/27/2020 14:07 EDT Systolic Blood Pressure 133 mmHg    Diastolic Blood Pressure 72 mmHg    Heart Rate Monitored  89 bpm    Respiratory Rate 16 br/min    SpO2 100 %   11/27/2020 13:53 EDT Systolic Blood Pressure 162 mmHg  HI    Diastolic Blood Pressure 72 mmHg    Temperature Oral 36.6 degC    Heart Rate Monitored 90 bpm    Respiratory Rate 16 br/min    SpO2 100 %   .   Measurements   11/27/2020 13:57 EDT Body Mass Index est meas 28.29 kg/m2    Body Mass Index Measured 28.29 kg/m2   11/27/2020 13:53 EDT Height/Length Measured 188 cm    Weight Dosing 100 kg   .   Basic Oxygen Information   11/27/2020 14:07 EDT Oxygen Therapy Room air    SpO2 100 %   11/27/2020 13:53 EDT Oxygen Therapy Room air    SpO2 100 %   .   General:  Alert, no acute distress.    Glasgow coma scale:  Total score: Total score: 15.   Neurological:  Alert and oriented to person, place, time, and situation, normal sensory observed, normal motor observed.    Skin:  Warm.   Head:  Normocephalic.   Neck:  Supple.   Eye:  Pupils are equal, round and reactive to light, extraocular movements are intact, vision unchanged, Mild injected left conjunctiva.  No foreign body.  Minimal corneal abrasion noted less than 1 mm  in size to the anterior upper portion of the cornea..    Ears, nose, mouth and throat:  Oral mucosa moist.   Cardiovascular:  Regular rate and rhythm.   Respiratory:  Lungs are clear to auscultation, respirations are non-labored.    Chest wall:  No tenderness.   Back:  Nontender, Normal range of motion.    Musculoskeletal:  Normal ROM.   Gastrointestinal:  Soft, Nontender, Non distended.    Genitourinary   Psychiatric:  Cooperative.      Reexamination/ Reevaluation   Course: improving.   Pain status: decreased.   Assessment: exam improved, Patient DVT ultrasound was a small Baker's cyst as well as no DVT or signs of further occult like process.  We will have him follow-up with Ortho for further operative planning versus conservative treatment.  We will also place him on topical erythromycin for his minimal like conjunctival symptoms and possible small corn abrasion.  Given outpatient follow-up with optometry as discussed return for acute change or worsening without fail..      Impression and Plan   Diagnosis   Conjunctivitis (ICD10-CM H10.9, Discharge, Medical)   Bakers cyst (ICD10-CM M71.20, Discharge, Medical)   Plan   Condition: Improved, Stable.    Disposition: Discharged: to home.    Prescriptions: Launch prescriptions   Pharmacy:  erythromycin 0.5% ophthalmic ointment (Prescribe): 0.5 in, OPHTH, QID, for 5 days, 4 g, 0 Refill(s).    Patient was given the following educational materials: Baker Cyst, Allergic Conjunctivitis, Adult, Allergic Conjunctivitis, Adult, Baker Cyst.    Follow up with: Follow up with primary care provider Within 1 week Return to ED if symptoms worsen, Follow up with primary care provider Within 1 week Return to ED if symptoms worsen; ROBERT SCHODERBEK Within 1 week.    Counseled: Patient, I had a detailed discussion with the patient and/or guardian regarding the historical points/exam findings supporting the discharge diagnosis and need for outpatient followup. Discussed the need to  return to the ER if symptoms persist/worsen, or for any questions/concerns that arise at home.    Games developer  Signed on 11/27/2020 03:19 PM EDT   ________________________________________________   Loleta Books               Modified by: Loleta Books on 11/27/2020 03:00 PM EDT      Modified by: Loleta Books on 11/27/2020 03:19 PM EDT

## 2020-11-27 NOTE — ED Notes (Signed)
ED Triage Note       ED Triage Adult Entered On:  11/27/2020 13:57 EDT    Performed On:  11/27/2020 13:53 EDT by Leonette Nutting J-RN               Triage   Numeric Rating Pain Scale :   5 = Moderate pain   Chief Complaint :   pain behind left knee yesterday that has progressed to pain and swelling of lower leg. Suspected blood clot per pt.   [place] Mode of Arrival :   Walking   Infectious Disease Documentation :   Document assessment   Temperature Oral :   36.6 degC(Converted to: 97.9 degF)    Heart Rate Monitored :   90 bpm   Respiratory Rate :   16 br/min   Systolic Blood Pressure :   162 mmHg (HI)    Diastolic Blood Pressure :   72 mmHg   SpO2 :   100 %   Oxygen Therapy :   Room air   Patient presentation :   None of the above   Chief Complaint or Presentation suggest infection :   No   Weight Dosing :   100 kg(Converted to: 220 lb 7 oz)    Height :   188 cm(Converted to: 6 ft 2 in)    Body Mass Index Dosing :   28 kg/m2   Leonette Nutting J-RN - 11/27/2020 13:53 EDT   DCP GENERIC CODE   Tracking Acuity :   3   Tracking Group :   ED [facility] Tracking [facility]   Leonette Nutting J-RN - 11/27/2020 13:53 EDT   ED General Section :   Document assessment   Pregnancy Status :   N/A   ED Allergies Section :   Document assessment   ED Reason for Visit Section :   Document assessment   Leonette Nutting J-RN - 11/27/2020 13:53 EDT   ID Risk Screen Symptoms   Recent Travel History :   No recent travel   Last 90 days COVID-19 ID :   No   Close Contact with COVID-19 ID :   No   Leonette Nutting J-RN - 11/27/2020 13:53 EDT   Allergies   (As Of: 11/27/2020 13:57:32 EDT)   Allergies (Active)   Avelox  Estimated Onset Date:   Unspecified ; Reactions:   Angioedema, Unknown ; Created By:   JEFFCOAT, RN, CONSTANCE; Reaction Status:   Active ; Category:   Drug ; Substance:   Avelox ; Type:   Allergy ; Severity:   Moderate ; Updated By:   JEFFCOAT, RN, CONSTANCE; Reviewed Date:   07/07/2019 10:07 EST      Compazine  Estimated Onset Date:   Unspecified ; Reactions:    Unknown, Hallucinating ; Created By:   JEFFCOAT, RN, CONSTANCE; Reaction Status:   Active ; Category:   Drug ; Substance:   Compazine ; Type:   Allergy ; Severity:   Unknown ; Updated By:   JEFFCOAT, RN, CONSTANCE; Reviewed Date:   07/07/2019 10:07 EST      gabapentin  Estimated Onset Date:   Unspecified ; Reactions:   Jittery, Unknown ; Created By:   JEFFCOAT, RN, CONSTANCE; Reaction Status:   Active ; Category:   Drug ; Substance:   gabapentin ; Type:   Allergy ; Severity:   Moderate ; Updated By:   JEFFCOAT, RN, CONSTANCE; Reviewed Date:   07/07/2019 10:07 EST  HYDROcodone  Estimated Onset Date:   Unspecified ; Reactions:   Itching ; Created By:   Chandra Batch, RN, HELEN E; Reaction Status:   Active ; Category:   Drug ; Substance:   HYDROcodone ; Type:   Allergy ; Severity:   Unknown ; Updated By:   Chandra Batch, RN, Keith Rake; Reviewed Date:   07/07/2019 10:07 EST      Omnicef  Estimated Onset Date:   Unspecified ; Reactions:   Unknown, Angioedema ; Created By:   JEFFCOAT, RN, CONSTANCE; Reaction Status:   Active ; Category:   Drug ; Substance:   Omnicef ; Type:   Allergy ; Severity:   Moderate ; Updated By:   JEFFCOAT, RN, CONSTANCE; Reviewed Date:   07/07/2019 10:07 EST        Psycho-Social   Last 3 mo, thoughts killing self/others :   Patient denies   Right click within box for Suspected Abuse policy link. :   None   Feels Safe Where Live :   Yes   ED Behavioral Activity Rating Scale :   3 - Drowsy, appears sedated   Leonette Nutting J-RN - 11/27/2020 13:53 EDT   ED Reason for Visit   (As Of: 11/27/2020 13:57:32 EDT)   Problems(Active)    Anxiety (SNOMED CT  :80998338 )  Name of Problem:   Anxiety ; Recorder:   Chandra Batch, RN, HELEN E; Confirmation:   Confirmed ; Classification:   Patient Stated ; Code:   [id number] ; Contributor System:   PowerChart ; Last Updated:   07/04/2019 14:19 EST ; Life Cycle Date:   07/04/2019 ; Life Cycle Status:   Active ; Vocabulary:   SNOMED CT        Depression (SNOMED CT  :73419379 )  Name of  Problem:   Depression ; Recorder:   Chandra Batch, RN, HELEN E; Confirmation:   Confirmed ; Classification:   Patient Stated ; Code:   [id number] ; Contributor System:   Dietitian ; Last Updated:   07/04/2019 14:18 EST ; Life Cycle Date:   07/04/2019 ; Life Cycle Status:   Active ; Vocabulary:   SNOMED CT        HTN (hypertension) (SNOMED CT  :3299242683 )  Name of Problem:   HTN (hypertension) ; Recorder:   Chandra Batch, RN, HELEN E; Confirmation:   Confirmed ; Classification:   Patient Stated ; Code:   [id number] ; Contributor System:   Dietitian ; Last Updated:   07/04/2019 14:17 EST ; Life Cycle Date:   07/04/2019 ; Life Cycle Status:   Active ; Vocabulary:   SNOMED CT        Hyperlipemia (SNOMED CT  :89211941 )  Name of Problem:   Hyperlipemia ; Recorder:   Chandra Batch, RN, HELEN E; Confirmation:   Confirmed ; Classification:   Patient Stated ; Code:   [id number] ; Contributor System:   PowerChart ; Last Updated:   07/04/2019 14:17 EST ; Life Cycle Date:   07/04/2019 ; Life Cycle Status:   Active ; Vocabulary:   SNOMED CT        Knee pain, bilateral (SNOMED CT  :18563149 )  Name of Problem:   Knee pain, bilateral ; Recorder:   FISCHER, RN, HELEN E; Confirmation:   Confirmed ; Classification:   Patient Stated ; Code:   [id number] ; Contributor System:   PowerChart ; Last Updated:   07/04/2019 14:18 EST ; Life Cycle Date:   07/04/2019 ; Life Cycle Status:   Active ;  Vocabulary:   SNOMED CT        Low back pain (SNOMED CT  :540981191 )  Name of Problem:   Low back pain ; Recorder:   FISCHER, RN, HELEN E; Confirmation:   Confirmed ; Classification:   Patient Stated ; Code:   [id number] ; Contributor System:   PowerChart ; Last Updated:   07/04/2019 14:18 EST ; Life Cycle Date:   07/04/2019 ; Life Cycle Status:   Active ; Vocabulary:   SNOMED CT        Neck pain (SNOMED CT  :308657846 )  Name of Problem:   Neck pain ; Recorder:   FISCHER, RN, HELEN E; Confirmation:   Confirmed ; Classification:   Patient Stated ; Code:   [id number] ; Contributor  System:   PowerChart ; Last Updated:   07/04/2019 14:18 EST ; Life Cycle Date:   07/04/2019 ; Life Cycle Status:   Active ; Vocabulary:   SNOMED CT        OA (osteoarthritis) (SNOMED CT  :3244010272 )  Name of Problem:   OA (osteoarthritis) ; Recorder:   Chandra Batch, RN, HELEN E; Confirmation:   Confirmed ; Classification:   Patient Stated ; Code:   [id number] ; Contributor System:   Dietitian ; Last Updated:   07/04/2019 14:18 EST ; Life Cycle Date:   07/04/2019 ; Life Cycle Status:   Active ; Vocabulary:   SNOMED CT        Right shoulder injury (SNOMED CT  :4259563875 )  Name of Problem:   Right shoulder injury ; Recorder:   FISCHER, RN, HELEN E; Confirmation:   Confirmed ; Classification:   Patient Stated ; Code:   [id number] ; Contributor System:   Dietitian ; Last Updated:   07/04/2019 14:17 EST ; Life Cycle Date:   07/04/2019 ; Life Cycle Status:   Active ; Vocabulary:   SNOMED CT        Snores (SNOMED CT  :416606301 )  Name of Problem:   Snores ; Recorder:   Chandra Batch, RN, HELEN E; Confirmation:   Confirmed ; Classification:   Patient Stated ; Code:   [id number] ; Contributor System:   Dietitian ; Last Updated:   07/04/2019 14:17 EST ; Life Cycle Date:   07/04/2019 ; Life Cycle Status:   Active ; Vocabulary:   SNOMED CT          Diagnoses(Active)    Leg pain-swelling  Date:   11/27/2020 ; Diagnosis Type:   Reason For Visit ; Confirmation:   Complaint of ; Clinical Dx:   Leg pain-swelling ; Classification:   Medical ; Clinical Service:   Emergency medicine ; Code:   PNED ; Probability:   0 ; Diagnosis Code:   [id number]

## 2020-11-27 NOTE — ED Notes (Signed)
 ED Patient Summary       ;       [facility]  [address] [place], [place] [address]  [phone]  Discharge Instructions (Patient)  Dominic Knight, Dominic Knight  DOB:  [birth year]                   MRN: [id number]                   FIN: [phone]  Reason For Visit: Leg pain-swelling; POSS CLOT IN L LEG  Final Diagnosis: Bakers cyst; Conjunctivitis     Visit Date: 11/27/2020 13:00:00  Address: [address] [place] [address]  Phone: [phone]     Emergency Department Providers:         Primary Physician:      LANE BACHELOR      [facility] would like to thank you for allowing us  to assist you with your healthcare needs. The following includes patient education materials and information regarding your injury/illness.     Follow-up Instructions:  You were seen today on an emergency basis. Please contact your primary care doctor for a follow up appointment. If you received a referral to a specialist doctor, it is important you follow-up as instructed.    It is important that you call your follow-up doctor to schedule and confirm the location of your next appointment. Your doctor may practice at multiple locations. The office location of your follow-up appointment may be different to the one written on your discharge instructions.    If you do not have a primary care doctor, please call [phone] for help in finding a [facility] Provider. For help in finding a specialist doctor, please call [phone].    If your condition gets worse before your follow-up with your primary care doctor or specialist, please return to the Emergency Department.      Coronavirus 2019 (COVID-19) Reminders:     Patients age 77 - 51, with parental consent, and patients over age 50 can make an appointment for a COVID-19 vaccine. Patients can contact their [facility] doctors' offices to schedule an appointment to receive the  COVID-19 vaccine. Patients who do not have a [facility] physician can call [phone] to schedule vaccination appointments.      Follow Up Appointments:  Primary Care Provider:     Name: TRAVIS RAISIN J-MD     Phone: [phone]                 With: Address: When:   [name] [address], [address] [place], [place] [address]  [phone] Business (1) Within 1 week       With: Address: When:   Follow up with primary care provider  Within 1 week   Comments:   Return to ED if symptoms worsen              Post [facility] SERVICES%>          New Medications  Printed Prescriptions  erythromycin ophthalmic (erythromycin 0.5% ophthalmic ointment) 0.5 Inch Ophthalmic (the eye) 4 times a day for 5 Days. Refills: 0.  Last Dose:____________________  Medications that have not changed  Other Medications  acetaminophen -oxyCODONE  (oxyCODONE -acetaminophen  10 mg-325 mg oral tablet) 1 Tabs Oral (given by mouth) 3 times a day as needed severe pain (8-10)., MAX DAILY DOSE OF ACETAMINOPHEN  = 4000  MG  Last Dose:____________________  atorvastatin (atorvastatin 40 mg oral tablet) 1 Tabs Oral (given by mouth) every day.  Last Dose:____________________  bifidobacterium-lactobacillus (Probiotic Formula oral capsule) Oral (given by mouth) every day.  Last Dose:____________________  cetirizine (ZyrTEC 10 mg oral tablet) 1 Tabs Oral (given by mouth) every day.  Last Dose:____________________  cyanocobalamin (Vitamin B12 oral tablet)   Last Dose:____________________  cyclobenzaprine (Flexeril 10 mg oral tablet) 1 Tabs Oral (given by mouth) 3 times a day as needed.,  THIS MEDICATION IS ASSOCIATED  WITH  AN INCREASED RISK OF FALLS.  Last Dose:____________________  docusate (docusate sodium 100 mg oral capsule) 1 Capsules Oral (given by mouth) 2 times a day as needed.  Last Dose:____________________  lisinopril (lisinopril 10 mg oral tablet) 3 Tabs Oral (given by mouth) every day.  Last  Dose:____________________  magnesium oxide (magnesium oxide 400 mg (240 mg elemental magnesium) oral tablet) Oral (given by mouth) every day.  Last Dose:____________________  Misc Medication (OMEPRAZOLE DR 20 MG CAPSULE)   Last Dose:____________________  omega-3 polyunsaturated fatty acids (Fish Oil oral capsule) 1 Capsules Oral (given by mouth) every day.  Last Dose:____________________  omeprazole (omeprazole 20 mg oral delayed release capsule) 1 Capsules Oral (given by mouth) every day.  Last Dose:____________________  ondansetron  (Zofran  4 mg oral tablet) 1-2 tabs Oral (given by mouth) every 6 hours as needed as needed for nausea/vomiting for 7 Days. Refills: 0.  Last Dose:____________________  pregabalin Oral (given by mouth).  Last Dose:____________________  senna (Senna) Oral (given by mouth) Once a Day (at bedtime).  Last Dose:____________________  tamsulosin (tamsulosin 0.4 mg oral capsule) 1 Capsules Oral (given by mouth) every day.  Last Dose:____________________  traZODone (traZODone 100 mg oral tablet) 3 Tabs Oral (given by mouth) At Bedtime (Once a Day)., DO NOT CRUSH  Last Dose:____________________      Allergy Info: HYDROcodone; Avelox; Omnicef; Compazine; gabapentin     >Discharge Additional Information          Discharge Patient 11/27/20 15:00:00 EDT      Patient Education Materials:        Allergic Conjunctivitis, Adult      Allergic conjunctivitis is inflammation of the conjunctiva. The conjunctiva is the thin, clear membrane that covers the white part of the eye and the inner surface of the eyelid. In this condition:   The blood vessels in the conjunctiva become irritated and swell.     The eyes become red or pink and feel itchy.      Allergic conjunctivitis cannot be spread from person to person. This condition can develop at any age and may be outgrown.      What are the causes?    This condition is caused by allergens. These are things that can cause an allergic reaction in some people but  not in other people. Common allergens include:   Outdoor allergens, such as:  ? Pollen, including pollen from grass and weeds.    ? Mold spores.    ? Car fumes.       Indoor allergens, such as:  ? Dust.    ? Smoke.    ? Mold spores.    ? Proteins in a pet's urine, saliva, or dander.          What increases the risk?    You may be more likely to develop this condition if you have a family history of these things:   Allergies.     Conditions caused by being exposed to allergens,  such as:  ? Allergic rhinitis. This is an allergic reaction that affects the nose.    ? Bronchial asthma. This condition affects the large airways in the lungs and makes breathing difficult.    ? Atopic dermatitis (eczema). This is inflammation of the skin that is long-term (chronic).          What are the signs or symptoms?    Symptoms of this condition include eyes that are:   Itchy.     Red.     Watery.     Puffy.      Your eyes may also:   Sting or burn.     Have clear fluid draining from them.     Have thick mucus discharge and pain (vernal conjunctivitis).        How is this diagnosed?    This condition may be diagnosed by:    Your medical history.      A physical exam.      Tests of the fluid draining from your eyes to rule out other causes.     Other tests to confirm the diagnosis, including:  ? Testing for allergies. The skin may be pricked with a tiny needle. The pricked area is then exposed to small amounts of allergens.    ? Testing for other eye conditions. Tests may include:  ? Blood tests.    ? Tissue scrapings from your eyelid. The tissue is then checked under a microscope.            How is this treated?      This condition may be treated with:   Cold, wet cloths (cold compresses) to soothe itching and swelling.     Washing the face to remove allergens.     Eye drops. These may be prescription or over-the-counter. You may need to try different types to see which one works best for you, such as:  ? Eye drops that block the  allergic reaction (antihistamine).    ? Eye drops that reduce swelling and irritation (anti-inflammatory).    ? Steroid eye drops, which may be given if other treatments have not worked (vernal conjunctivitis).       Oral antihistamine medicines. These are medicines taken by mouth to lessen your allergic reaction. You may need these if eye drops do not help or are difficult to use.        Follow these instructions at home:    Eye care     Apply a clean, cold compress to your eyes for 10?20 minutes, 3?4 times a day.     Do not touch or rub your eyes.     Do not wear contact lenses until the inflammation is gone. Wear glasses instead.     Do not wear eye makeup until the inflammation is gone.      General instructions     Avoid known allergens whenever possible.     Take or apply over-the-counter and prescription medicines only as told by your health care provider. These include any eye drops.     Drink enough fluid to keep your urine pale yellow.     Keep all follow-up visits as told by your health care provider. This is important.        Contact a health care provider if:     Your symptoms get worse or do not get better with treatment.     You have mild eye pain.     You become sensitive to  light.     You have spots or blisters on your eyes.     You have pus draining from your eyes.     You have a fever.      Get help right away if:     You have redness, swelling, or other symptoms in only one eye.     Your vision is blurred or you have other vision changes.     You have severe eye pain.      Summary     Allergic conjunctivitis is inflammation of the clear membrane that covers the white part of the eye and the inner surface of the eyelid.     Take or apply over-the-counter and prescription medicines only as told by your health care provider. These include eye drops.     Do not touch or rub your eyes.     Contact a health care provider if your symptoms get worse or do not get better with treatment.      This  information is not intended to replace advice given to you by your health care provider. Make sure you discuss any questions you have with your health care provider.      Document Revised: 06/06/2019 Document Reviewed: 06/06/2019  Elsevier Patient Education ? 2021 Elsevier Inc.       Engineer, production Cyst      A Baker cyst, also called a popliteal cyst, is a growth that forms at the back of the knee. The cyst forms when the fluid-filled sac (bursa) that cushions the knee joint becomes enlarged.      What are the causes?    In most cases, a Baker cyst results from another knee problem that causes swelling inside the knee. This makes the fluid inside the knee joint (synovial fluid) flow into the bursa behind the knee, causing the bursa to enlarge.      What increases the risk?    You may be more likely to develop a Baker cyst if you already have a knee problem, such as:   A tear in cartilage that cushions the knee joint (meniscal tear).     A tear in the tissues that connect the bones of the knee joint (ligament tear).     Knee swelling from osteoarthritis, rheumatoid arthritis, or gout.        What are the signs or symptoms?    The main symptom of this condition is a lump behind the knee. This may be the only symptom of the condition. The lump may be painful, especially when the knee is straightened. If the lump is painful, the pain may come and go. The knee may also be stiff.    Symptoms may quickly get more severe if the cyst breaks open (ruptures). If the cyst ruptures, you may feel the following in your knee and calf:   Sudden or worsening pain.     Swelling.     Bruising.     Redness in the calf.      A Baker cyst does not always cause symptoms.      How is this diagnosed?    This condition may be diagnosed based on your symptoms and medical history. Your health care provider will also do a physical exam. This may include:   Feeling the cyst to check whether it is tender.     Checking your knee for signs of another knee  condition that causes swelling.      You may have imaging  tests, such as:   X-rays.     MRI.     Ultrasound.        How is this treated?    A Baker cyst that is not painful may go away without treatment. If the cyst gets large or painful, it will likely get better if the underlying knee problem is treated.    If needed, treatment for a Baker cyst may include:   Resting.     Keeping weight off of the knee. This means not leaning on the knee to support your body weight.     Taking NSAIDs, such as ibuprofen, to reduce pain and swelling.     Having a procedure to drain the fluid from the cyst with a needle (aspiration). You may also get an injection of a medicine that reduces swelling (steroid).     Having surgery. This may be needed if other treatments do not work. This usually involves correcting knee damage and removing the cyst.        Follow these instructions at home:      Activity     Rest as told by your health care provider.     Avoid activities that make pain or swelling worse.     Return to your normal activities as told by your health care provider. Ask your health care provider what activities are safe for you.     Do not use the injured limb to support your body weight until your health care provider says that you can. Use crutches as told by your health care provider.      General instructions     Take over-the-counter and prescription medicines only as told by your health care provider.     Keep all follow-up visits as told by your health care provider. This is important.        Contact a health care provider if:     You have knee pain, stiffness, or swelling that does not get better.      Get help right away if:     You have sudden or worsening pain and swelling in your calf area.      Summary     A Baker cyst, also called a popliteal cyst, is a growth that forms at the back of the knee.     In most cases, a Baker cyst results from another knee problem that causes swelling inside the knee.     A Baker  cyst that is not painful may go away without treatment.     If needed, treatment for a Baker cyst may include resting, keeping weight off of the knee, medicines, or draining fluid from the cyst.     Surgery may be needed if other treatments are not effective.      This information is not intended to replace advice given to you by your health care provider. Make sure you discuss any questions you have with your health care provider.      Document Revised: 11/26/2018 Document Reviewed: 11/26/2018  Elsevier Patient Education ? 2021 Elsevier Inc.      ---------------------------------------------------------------------------------------------------------------------  [facility] allows patients to review your COVID and other test results as well as discharge documents from any [facility], Emergency Department, surgical center or outpatient lab. Test results are typically available 36 hours after the test is completed.     [facility] encourages you to self-enroll in the [facility] Patient Portal.  To begin your self-enrollment process, please visit [email or web address] Under [facility], click on "Sign up now".     NOTE: You must be 16 years and older to use [facility] Self-Enroll online. If you are a parent, caregiver, or guardian; you need an invite to access your child's or dependent's health records. To obtain an invite, contact the Medical Records department at [phone] Monday through Friday, 8-4:30, select option 3 . If we receive your call afterhours, we will return your call the next business day.     If you have issues trying to create or access your account, contact [employer] support at [phone] available 7 days a week 24 hours a day.     Comment:

## 2020-11-27 NOTE — Discharge Summary (Signed)
 ED Clinical Summary                     [facility]  [address] [place], [place], [address]  [phone]          PERSON INFORMATION  Name: Dominic Knight, Dominic Knight Age:  65 Years DOB: [birth year]   Sex: Male Language: English PCP: TRAVIS RAISIN J-MD   Marital Status: Married Phone: [phone] Med Service: MED-Medicine   MRN: [id number] Acct# [id number] Arrival: 11/27/2020 13:00:00   Visit Reason: Leg pain-swelling; POSS CLOT IN L LEG Acuity: 3 LOS: 000 02:11   Address:    [address] [place] [address]   Diagnosis:    Bakers cyst; Conjunctivitis  Medications:          New Medications  Printed Prescriptions  erythromycin ophthalmic (erythromycin 0.5% ophthalmic ointment) 0.5 Inch Ophthalmic (the eye) 4 times a day for 5 Days. Refills: 0.  Last Dose:____________________  Medications that have not changed  Other Medications  acetaminophen -oxyCODONE  (oxyCODONE -acetaminophen  10 mg-325 mg oral tablet) 1 Tabs Oral (given by mouth) 3 times a day as needed severe pain (8-10)., MAX DAILY DOSE OF ACETAMINOPHEN  = 4000 MG  Last Dose:____________________  atorvastatin (atorvastatin 40 mg oral tablet) 1 Tabs Oral (given by mouth) every day.  Last Dose:____________________  bifidobacterium-lactobacillus (Probiotic Formula oral capsule) Oral (given by mouth) every day.  Last Dose:____________________  cetirizine (ZyrTEC 10 mg oral tablet) 1 Tabs Oral (given by mouth) every day.  Last Dose:____________________  cyanocobalamin (Vitamin B12 oral tablet)   Last Dose:____________________  cyclobenzaprine (Flexeril 10 mg oral tablet) 1 Tabs Oral (given by mouth) 3 times a day as needed.,  THIS MEDICATION IS ASSOCIATED  WITH  AN INCREASED RISK OF FALLS.  Last Dose:____________________  docusate (docusate sodium 100 mg oral capsule) 1 Capsules Oral (given by mouth) 2 times a day as needed.  Last Dose:____________________  lisinopril (lisinopril 10 mg oral tablet) 3 Tabs Oral (given by  mouth) every day.  Last Dose:____________________  magnesium oxide (magnesium oxide 400 mg (240 mg elemental magnesium) oral tablet) Oral (given by mouth) every day.  Last Dose:____________________  Misc Medication (OMEPRAZOLE DR 20 MG CAPSULE)   Last Dose:____________________  omega-3 polyunsaturated fatty acids (Fish Oil oral capsule) 1 Capsules Oral (given by mouth) every day.  Last Dose:____________________  omeprazole (omeprazole 20 mg oral delayed release capsule) 1 Capsules Oral (given by mouth) every day.  Last Dose:____________________  ondansetron  (Zofran  4 mg oral tablet) 1-2 tabs Oral (given by mouth) every 6 hours as needed as needed for nausea/vomiting for 7 Days. Refills: 0.  Last Dose:____________________  pregabalin Oral (given by mouth).  Last Dose:____________________  senna (Senna) Oral (given by mouth) Once a Day (at bedtime).  Last Dose:____________________  tamsulosin (tamsulosin 0.4 mg oral capsule) 1 Capsules Oral (given by mouth) every day.  Last Dose:____________________  traZODone (traZODone 100 mg oral tablet) 3 Tabs Oral (given by mouth) At Bedtime (Once a Day)., DO NOT CRUSH  Last Dose:____________________      Medications Administered During Visit:              Allergies      Compazine (Unknown) (Hallucinating)      HYDROcodone (Itching)      Avelox (Angioedema) (Unknown)      gabapentin (Jittery) (Unknown)      Omnicef (Unknown) (Angioedema)      Major Tests and Procedures:  The following procedures and  tests were performed during your ED visit.  COMMON PROCEDURES%>  COMMON PROCEDURES COMMENTS%>                PROVIDER INFORMATION               Provider Role Assigned Sampson SICK, [facility] M-DO ED Provider 11/27/2020 13:52:58 11/27/2020 14:47:37   POTTER, ANDREW-DO ED Provider 11/27/2020 14:03:15    Lu Radford J-RN ED Nurse 11/27/2020 14:07:41        Attending Physician:  LANE BACHELOR      Admit Doc  POTTER,  ANDREW-DO     Consulting Doc       VITALS INFORMATION  Vital Sign  Triage Latest   Temp Oral ORAL_1%> ORAL%>   Temp Temporal TEMPORAL_1%> TEMPORAL%>   Temp Intravascular INTRAVASCULAR_1%> INTRAVASCULAR%>   Temp Axillary AXILLARY_1%> AXILLARY%>   Temp Rectal RECTAL_1%> RECTAL%>   02 Sat 100 % 100 %   Respiratory Rate RATE_1%> RATE%>   Peripheral Pulse Rate PULSE RATE_1%> PULSE RATE%>   Apical Heart Rate HEART RATE_1%> HEART RATE%>   Blood Pressure BLOOD PRESSURE_1%>/ BLOOD PRESSURE_1%>72 mmHg BLOOD PRESSURE%> / BLOOD PRESSURE%>72 mmHg                 Immunizations      No Immunizations Documented This Visit          DISCHARGE INFORMATION   Discharge Disposition: H Outpt-Sent Home   Discharge Location:  Home   Discharge Date and Time:  11/27/2020 15:11:44   ED Checkout Date and Time:  11/27/2020 15:11:44     DEPART REASON INCOMPLETE INFORMATION               Depart Action Incomplete Reason   Interactive View/I&O Recently assessed               Problems      No Problems Documented              Smoking Status      Former smoker, quit more than 30 days ago         PATIENT EDUCATION INFORMATION  Instructions:     Allergic Conjunctivitis, Adult; Baker Cyst     Follow up:                   With: Address: When:   [facility] [address], [address] [place], [place] [address]  [phone] Business (1) Within 1 week       With: Address: When:   Follow up with primary care provider  Within 1 week   Comments:   Return to ED if symptoms worsen              ED PROVIDER DOCUMENTATION
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
## 2021-02-04 NOTE — Discharge Summary (Signed)
Inpatient Patient Summary              [facility]  [address] [place], [place] [address]  [phone]  Discharge Instructions (Patient)  Dominic Knight, Dominic Knight  DOB:  12-02-1955                   MRN: [id number]                   FIN: [id number]  Reason For Visit: S83.232A,M17.12  Final Diagnosis: Complex tear of medial meniscus of left knee     Visit Date: 01/30/2021 13:21:27  Address: [address] [name] [address]  Phone: [phone]     Providers:         Primary Physician:      None      Admitting Providers: Florinda Marker  Attending Providers: Florinda Marker     [facility] would like to thank you for allowing [place] to assist you with your healthcare needs. The following includes patient education materials and information regarding your injury/illness.     Follow-up Instructions:  You were seen today on an emergency basis. Please contact your primary care doctor for a follow up appointment. If you received a referral to a specialist doctor, it is important you follow-up as instructed.    It is important that you call your follow-up doctor to schedule and confirm the location of your next appointment. Your doctor may practice at multiple locations. The office location of your follow-up appointment may be different to the one written on your discharge instructions.    If you do not have a primary care doctor, please call [phone] for help in finding a [facility] Provider. For help in finding a specialist doctor, please call [phone].    If your condition gets worse before your follow-up with your primary care doctor or specialist, please return to the Emergency Department.      Coronavirus 2019 (COVID-19) Reminders:     Patients age 57 - 61, with parental consent, and patients over age 97 can make an appointment for a COVID-19 vaccine. Patients can contact their [facility] doctors' offices to  schedule an appointment to receive the COVID-19 vaccine. Patients who do not have a [facility] physician can call [phone] to schedule vaccination appointments.      Follow Up Appointments:  Primary Care Provider:     Name: Jens Som J-MD     Phone: [phone]                 With: Address: When:   [facility] [address], [address] [place], [place] [address]  [phone] Business (1) In 2 weeks   Comments:   Call Irving Burton [phone] for appointment day and time.       With: Address: When:   [facility] [address] [place], [place] [address]  [phone] Business (1)               [facility] SERVICES%>  Discharge Service Agency Selected  DISCHARGE SERVICE AGENCY SELECTED%>     DME Agency  DME AGENCY%>             Medications that have not changed  Other Medications  acetaminophen-oxyCODONE (oxyCODONE-acetaminophen 10 mg-325 mg oral tablet) 1 Tabs Oral (given by mouth) 3 times a day as needed severe pain (8-10).,  MAX DAILY DOSE OF ACETAMINOPHEN = 4000 MG  Last Dose:____________________  atorvastatin (atorvastatin 40 mg oral tablet) 1 Tabs Oral (given by mouth) every day.  Last Dose:____________________  bifidobacterium-lactobacillus (Probiotic Formula oral capsule) Oral (given by mouth) every day.  Last Dose:____________________  cetirizine (ZyrTEC 10 mg oral tablet) 1 Tabs Oral (given by mouth) every day.  Last Dose:____________________  cyanocobalamin (Vitamin B12 oral tablet)   Last Dose:____________________  cyclobenzaprine (Flexeril 10 mg oral tablet) 1 Tabs Oral (given by mouth) 3 times a day as needed., " THIS MEDICATION IS ASSOCIATED  WITH  AN INCREASED RISK OF FALLS."  Last Dose:____________________  docusate (docusate sodium 100 mg oral capsule) 1 Capsules Oral (given by mouth) 2 times a day as needed.  Last Dose:____________________  lisinopril (lisinopril 10 mg oral tablet) 3 Tabs Oral (given by mouth) every day.  Last  Dose:____________________  magnesium oxide (magnesium oxide 400 mg (240 mg elemental magnesium) oral tablet) Oral (given by mouth) every day.  Last Dose:____________________  Misc Medication (OMEPRAZOLE DR 20 MG CAPSULE)   Last Dose:____________________  omega-3 polyunsaturated fatty acids (Fish Oil oral capsule) 1 Capsules Oral (given by mouth) every day.  Last Dose:____________________  omeprazole (omeprazole 20 mg oral delayed release capsule) 1 Capsules Oral (given by mouth) every day.  Last Dose:____________________  ondansetron (Zofran 4 mg oral tablet) 1-2 tabs Oral (given by mouth) every 6 hours as needed as needed for nausea/vomiting for 7 Days. Refills: 0.  Last Dose:____________________  pregabalin Oral (given by mouth).  Last Dose:____________________  senna (Senna) Oral (given by mouth) Once a Day (at bedtime).  Last Dose:____________________  tamsulosin (tamsulosin 0.4 mg oral capsule) 1 Capsules Oral (given by mouth) every day.  Last Dose:____________________  traZODone (traZODone 100 mg oral tablet) 3 Tabs Oral (given by mouth) At Bedtime (Once a Day)., DO NOT CRUSH  Last Dose:____________________      Allergy Info: HYDROcodone; Avelox; Omnicef; Compazine; gabapentin     >Discharge Additional Information    Patient Education Materials:       ---------------------------------------------------------------------------------------------------------------------  [facility]) encourages you to self-enroll in the [facility] Patient Portal.  [facility] Patient Portal will allow you to manage your personal health information securely from your own electronic device now and in the future.  To begin your Patient Portal enrollment process, please visit [email or web address] Click on ???Sign up now??? under [facility].  If you find that you need additional assistance on the [facility] Patient Portal or need a copy of your medical records, please call the [facility]  Medical Records Office at [phone].  Comment:

## 2021-02-04 NOTE — Discharge Summary (Signed)
Inpatient Clinical Summary             [facility]  Post-Acute Care Transfer Instructions  PERSON INFORMATION   Name: Dominic Knight, Dominic Knight  MRN: [id number]    FIN#: [id number]   PHYSICIANS  Admitting Physician: Florinda Marker  Attending Physician: Florinda Marker   PCP: Jens Som J-MD  Discharge Diagnosis:  Complex tear of medial meniscus of left knee  Comment:       PATIENT EDUCATION INFORMATION  Instructions:               Medication Leaflets:               Follow-up:                           With: Address: When:   [facility] [address], [address] [place], [place] [address]  [phone] Business (1) In 2 weeks   Comments:   Call Irving Burton [phone] for appointment day and time.       With: Address: When:   [facility] [address] [place], [place] [address]  [phone] Business (1)                                     Type Location Start Finish State   Surgery [facility] OR Jul/14/2022 4:00 PM Jul/14/2022 5:00 PM Confirmed          MEDICATION LIST  Medication Reconciliation at Discharge:          Medications that have not changed  Other Medications  acetaminophen-oxyCODONE (oxyCODONE-acetaminophen 10 mg-325 mg oral tablet) 1 Tabs Oral (given by mouth) 3 times a day as needed severe pain (8-10)., MAX DAILY DOSE OF ACETAMINOPHEN = 4000 MG  Last Dose:____________________  atorvastatin (atorvastatin 40 mg oral tablet) 1 Tabs Oral (given by mouth) every day.  Last Dose:____________________  bifidobacterium-lactobacillus (Probiotic Formula oral capsule) Oral (given by mouth) every day.  Last Dose:____________________  cetirizine (ZyrTEC 10 mg oral tablet) 1 Tabs Oral (given by mouth) every day.  Last Dose:____________________  cyanocobalamin (Vitamin B12 oral tablet)   Last Dose:____________________  cyclobenzaprine (Flexeril 10 mg oral tablet) 1 Tabs Oral (given by mouth) 3 times a day as needed., " THIS MEDICATION IS ASSOCIATED  WITH  AN INCREASED RISK OF FALLS."  Last  Dose:____________________  docusate (docusate sodium 100 mg oral capsule) 1 Capsules Oral (given by mouth) 2 times a day as needed.  Last Dose:____________________  lisinopril (lisinopril 10 mg oral tablet) 3 Tabs Oral (given by mouth) every day.  Last Dose:____________________  magnesium oxide (magnesium oxide 400 mg (240 mg elemental magnesium) oral tablet) Oral (given by mouth) every day.  Last Dose:____________________  Misc Medication (OMEPRAZOLE DR 20 MG CAPSULE)   Last Dose:____________________  omega-3 polyunsaturated fatty acids (Fish Oil oral capsule) 1 Capsules Oral (given by mouth) every day.  Last Dose:____________________  omeprazole (omeprazole 20 mg oral delayed release capsule) 1 Capsules Oral (given by mouth) every day.  Last Dose:____________________  ondansetron (Zofran 4 mg oral tablet) 1-2 tabs Oral (given by mouth) every 6 hours as needed as needed for nausea/vomiting for 7 Days. Refills: 0.  Last Dose:____________________  pregabalin Oral (given by mouth).  Last Dose:____________________  senna (Senna) Oral (given by mouth) Once a Day (at bedtime).  Last Dose:____________________  tamsulosin (tamsulosin 0.4 mg oral capsule) 1  Capsules Oral (given by mouth) every day.  Last Dose:____________________  traZODone (traZODone 100 mg oral tablet) 3 Tabs Oral (given by mouth) At Bedtime (Once a Day)., DO NOT CRUSH  Last Dose:____________________         Patient???s Final Home Medication List Upon Discharge:           acetaminophen-oxyCODONE (oxyCODONE-acetaminophen 10 mg-325 mg oral tablet) 1 Tabs Oral (given by mouth) 3 times a day as needed severe pain (8-10)., MAX DAILY DOSE OF ACETAMINOPHEN = 4000 MG  atorvastatin (atorvastatin 40 mg oral tablet) 1 Tabs Oral (given by mouth) every day.  bifidobacterium-lactobacillus (Probiotic Formula oral capsule) Oral (given by mouth) every day.  cetirizine (ZyrTEC 10 mg oral tablet) 1 Tabs Oral (given by mouth) every day.  cyanocobalamin (Vitamin B12 oral  tablet)  cyclobenzaprine (Flexeril 10 mg oral tablet) 1 Tabs Oral (given by mouth) 3 times a day as needed., " THIS MEDICATION IS ASSOCIATED  WITH  AN INCREASED RISK OF FALLS."  docusate (docusate sodium 100 mg oral capsule) 1 Capsules Oral (given by mouth) 2 times a day as needed.  lisinopril (lisinopril 10 mg oral tablet) 3 Tabs Oral (given by mouth) every day.  magnesium oxide (magnesium oxide 400 mg (240 mg elemental magnesium) oral tablet) Oral (given by mouth) every day.  Misc Medication (OMEPRAZOLE DR 20 MG CAPSULE)  omega-3 polyunsaturated fatty acids (Fish Oil oral capsule) 1 Capsules Oral (given by mouth) every day.  omeprazole (omeprazole 20 mg oral delayed release capsule) 1 Capsules Oral (given by mouth) every day.  ondansetron (Zofran 4 mg oral tablet) 1-2 tabs Oral (given by mouth) every 6 hours as needed as needed for nausea/vomiting for 7 Days. Refills: 0.  pregabalin Oral (given by mouth).  senna (Senna) Oral (given by mouth) Once a Day (at bedtime).  tamsulosin (tamsulosin 0.4 mg oral capsule) 1 Capsules Oral (given by mouth) every day.  traZODone (traZODone 100 mg oral tablet) 3 Tabs Oral (given by mouth) At Bedtime (Once a Day)., DO NOT CRUSH         Comment:       ORDERS          Order Name Order Details

## 2021-02-06 NOTE — Anesthesia Post-Procedure Evaluation (Signed)
Postanesthesia Evaluation        Patient:   Dominic Knight, Dominic Knight            MRN: [id number]            FIN: [phone]               Age:   65 years     Sex:  Male     DOB:  09-25-1955   Associated Diagnoses:   None   Author:   Liz Malady KEITH-MD      Postoperative Information   Post Operative Info:          Post operative day: Post Anesthesia Care Unit.         Patient location: PACU.       Assessment   Postanesthesia assessment   Vitals: Vital Signs   02/07/2021 16:52 EDT Systolic Blood Pressure 166 mmHg  HI    Diastolic Blood Pressure 85 mmHg    Heart Rate Monitored 86 bpm    Respiratory Rate 16 br/min    SpO2 99 %      .     Respiratory function: Respiratory rate, airway, and oxygen saturation are at adequate levels.     Cardiovascular function: Heart Rate stable, Blood Pressure stable, Postoperative hydration status Adequate.     Mental status: appropriate for level of anesthesia.     Temperature: within normal limits.     Pain Control: Adequate.     Nausea/Vomiting: Absent.     Signature Line     Electronically Signed on 02/07/2021 05:32 PM EDT   ________________________________________________   Liz Malady KEITH-MD               Modified by: Liz Malady KEITH-MD on 02/07/2021 05:32 PM EDT
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
## 2021-02-07 NOTE — Assessment & Plan Note (Signed)
PreOp Record - [facility]             PreOp Record - [facility] Summary                                                                     Primary Physician:        Estill Batten,  ROBERT-MD    Case Number:              [id number]    Finalized Date/Time:      02/07/21 15:38:00    Pt. Name:                 Dominic Knight, Dominic Knight    D.O.B./Sex:               01-19-1956    Male    Med Rec #:                [id number]    Physician:                Florinda Marker    Financial #:              [id number]    Pt. Type:                 S    Room/Bed:                 /    Admit/Disch:              02/07/21 07:36:00 -    Institution:       [facility] Case Attendance - Preop                                                                                              Entry 1                         Entry 2                                                                          Case Attendee             Jodi Marble, RN, Megan Mans    Role Performed            Surgeon Primary                 Preoperative Nurse    Time In     Time Out     Last Modified By:  Debbra Riding, RN, Dorena Dew, RNMegan Mans                              02/07/21 16:10:96               02/07/21 14:28:25      [facility] Case Attendance - Preop Audit                                                               02/07/21 14:28:25         Owner: Leroy Libman                               Modifier: [email or web address]                                                        <+> 2         Case Attendee        <+> 2         Role Performed        [facility] - Case Times - PreOp                                                                                                 Entry 1                                                                                                          Patient In Room Time      02/07/21 14:33:00               Nurse In Time                   02/07/21 14:33:00    Nurse Out Time            02/07/21 15:30:00               Patient Ready for                02/07/21 15:30:00  Surgery/Procedure     Last Modified By:         Delana Meyer                              02/07/21 15:37:55      [facility] - Case Times - PreOp Audit                                                                  02/07/21 15:37:55         Owner: Leroy Libman                               Modifier: [email or web address]                                                        <+> 1         Patient Ready for Surgery/Procedure        <+> 1         Nurse Out Time                Finalized By: Delana Meyer      Document Signatures                                                                             Signed By:           Delana Meyer 02/07/21 15:38

## 2021-02-07 NOTE — Procedures (Signed)
IntraOp Record - [facility]             IntraOp Record - [facility] Summary                                                                   Primary Physician:        Cline Crock,  ROBERT-MD    Case Number:              [id number]    Finalized Date/Time:      02/07/21 16:26:13    Pt. Name:                 Dominic Knight, Dominic Knight    D.O.B./Sex:               1956-01-15    Male    Med Rec #:                [id number]    Physician:                Juluis Mire    Financial #:              [id number]    Pt. Type:                 S    Room/Bed:                 /    Admit/Disch:              02/07/21 07:36:00 -    Institution:       [facility] - Case Times                                                                                                         Entry 1                                                                                                          Patient      In Room Time             02/07/21 15:25:00               Out Room Time                   02/07/21 16:12:00    Anesthesia     Procedure  Start Time               02/07/21 15:43:00               Stop Time                       02/07/21 16:07:00    Last Modified By:         Timmothy Sours                              02/07/21 16:25:59      [facility] - Case Times Audit                                                                          02/07/21 16:25:59         Owner: [id number]                              Modifier: [id number]                                                       <+> 1         Start Time     02/07/21 16:25:42         Owner: [id number]                              Modifier: [id number]                                                       <+> 1         Out Room Time     02/07/21 16:09:02         Owner: [id number]                              Modifier: [id number]                                                       <+> 1         Stop Time        [facility] - Case Attendance  Entry 1                         Entry 2                         Entry 3                                          Case Attendee             WALTERS,  DOUGLAS H-CRNA        [place],  ROBERT-MD          STEVENS,  [place]    Role Performed            CRNA                            Surgeon Primary                 Anesthesiologist    Time In     Time Out     Procedure                 Knee Arthroscopy                Knee Arthroscopy                Knee Arthroscopy                              Operative(Left)                 Operative(Left)                 Operative(Left)    Last Modified By:         Jonette Pesa, RN, Garen Grams, RN, Garen Grams, RN, Krystal Eaton                              02/07/21 15:02:04               02/07/21 15:02:04               02/07/21 15:05:43                                Entry 4                         Entry 5  Entry 6                                          Case Attendee             Jonette Pesa, RN, Barnie Del A-StuPA    Role Performed            Circulator                      Surgical Scrub                  Other Authorized                                                                                              Personnel    Time In     Time Out     Procedure                 Knee Arthroscopy                Knee Arthroscopy                Knee Arthroscopy                              Operative(Left)                 Operative(Left)                 Operative(Left)    Last Modified ByJonette Pesa, RN, Garen Grams, RN, Garen Grams, RNKrystal Eaton                              02/07/21 15:05:43               02/07/21 15:05:43               02/07/21 15:05:43      [facility] - Case Attendance Audit                                                                      02/07/21 15:05:43         Owner: [id number]  Modifier: [id number]                                                       <+> 3         Case Attendee        <+> 3         Role Performed        <+> 3         Procedure        <+> 4         Case Attendee        <+> 4         Role Performed        <+> 4         Procedure        <+> 5         Case Attendee        <+> 5         Role Performed        <+> 5         Procedure        <+> 6         Case Attendee        <+> 6         Role Performed        <+> 6         Procedure        [facility] - Skin Assessment                                                                          Pre-Care Text:            A.240 Assesses baseline skin condition  Im.120 Implements protective measures to prevent skin or tissue injury           due to mechanical sources   Im.280.1 Implements progective measures to prevent skin or tissue injury due to           thermal sources  Im.360 Monitors for signs and symptons of infection                              Entry 1                                                                                                          Skin Integrity            Intact    Last Modified By:         Jonette Pesa,  RN, Krystal Eaton                              02/07/21 15:51:48    Post-Care Text:            E.10 Evaluates for signs and symptoms of physical injury to skin and tissue  E.270 Evaluate tissue perfusion           O.60 Patient is free from signs and symptoms of injury caused by extraneous objects    O.210 Patinet's tissue           perfusion is consistent with or improved from baseline levels      [facility] - Patient Positioning                                                                      Pre-Care Text:            A.240 Assesses baseline skin condition  A.280 Identifies baseline musculoskeletal status  A.280.1 Identifies           physical alterations that require additional precautions for procedure-specific positioning  A.510.8  Maintains           patient's dignity and privacy  Im.120 Implements protective measures to prevent skin/tissue injury due to           mechanical sources  Im.40 Positions the patient  Im.80 Applies safety devices                              Entry 1                                                                                                          Procedure                 Knee Arthroscopy                Body Position                   Supine                              Operative(Left)    Left Arm Position         Extended on Padded Arm          Right Arm Position              Extended on Padded Arm                              Board w/Security Strap  Board w/Security Strap    Left Leg Position         Dangling, Other/See             Right Leg Position              Stirrup Leg Support                              Comments                                                        Lift Assist Secured In    Feet Uncrossed            Yes                             Pressure Points                 Yes                                                              Checked     Positioning Device        Safety Strap, Other/See         Positioned By                   Lynda Rainwater                              Comments, Wrist                                                 H-CRNA, [place],                              Security Strap                                                  ROBERT-MD, STEV[place],                                                                                              CHARLES KEITH-MD,  Jonette Pesa, RN, Beckey Downing,  Sarah                                                                                              A-StuPA    Outcome Met (O.80)        Yes    Last  Modified By:         Jonette Pesa, RN, Krystal Eaton                              02/07/21 15:51:08    Post-Care Text:            E.10 Evaluates for signs and symptoms of physical injury to skin and tissue  E.290 Evaluates musculoskeletal           status  O.80 Patient is free from signs and symptoms of injury related to positioning  O.120 the patient is           free from signs and symptoms of injury related to transfer/transport   O.250 Patient's musculoskeletal status           is maintained at or improved from baseline levels    General Comments:            johnson leg holder holding left leg      [facility] - Skin Prep                                                                                Pre-Care Text:            A.30 Verifies allergies  A.20 Verifies procedure, surgical site, and laterality  A.510.8 Maintains paritnet's           dignity and privacy  Im.270 Performs Skin Preparation  Im.270.1 Implements protective measures to prevent skin           and tissue injury due to chemical sources   A.300.1 Protects from cross-contamination                              Entry 1  Hair Removal     Skin Prep      Prep Agents (Im.270)     Chlorhexidine Gluconate         Prep Area (Im.270)              Leg Foot Left                              2% w/Alcohol     Prep Area Details        Left                            Prep By                         Cline Crock,  ROBERT-MD    Outcome Met (O.100)       Yes    Last Modified By:         Jonette Pesa, RN, Krystal Eaton                              02/07/21 15:52:50    Post-Care Text:            E.10 Evaluates for signs and symptoms of physical injury to skin and tissue  O.100 Patient is free from signs           and symptoms of chemical injury   O.740 The patient's right to privacy is maintained      [facility] - Counts Initial and Final                                                                  Pre-Care Text:            A.23 Verifies operative procedure, sugical site, and laterality  A.20.2 Assesses the risk for unintended           retained foreign body  Im.20 Performs required counts                              Entry 1                                                                                                          Initial Counts      Initial Counts           [place], RN, Martie Round           Items included in               [employer], [place]     Performed By  Reginia Naas                the Initial Count     Final Counts      Final Counts             Jonette Pesa, RN, Martie Round           Final Count Status              Correct     Performed By             Reginia Naas     Items Included in        Sponges, Sharps     Final Count     Outcome Met (O.20)        Yes    Last Modified By:         Jonette Pesa, RN, Krystal Eaton                              02/07/21 16:26:05    Post-Care Text:            E.50 Evaluates results of the surgical count  O.20 Patient is free from unintended retained foreign objects      [facility] - Counts Initial and Final Audit                                                            02/07/21 16:26:05         Owner: [id number]                              Modifier: [id number]                                                       <+> 1         Items Included in Final Count        <+> 1         Outcome Met (O.20)     02/07/21 16:12:11         Owner: [id number]                              Modifier: [id number]                                                       <+> 1         Final Counts Performed By        <+> 1         Final Count Status        [facility] - General Case Data  Pre-Care Text:            A.350.1 Classifies surgical wound                              Entry 1                                                                                                          Case Information      ASA Class                 2                               Case Level                      Level 3     OR                       MP 03                           Specialty                       Orthopedic (SN)     Wound Class              1-Clean    Preop Diagnosis           S83.232A,M17.12                 Postop Diagnosis                S83.232A,M17.12    Last Modified By:         Jonette Pesa RN, Krystal Eaton                              02/07/21 15:31:21    Post-Care Text:            O.760 Patient receives consistent and comparable care regardless of the setting      [facility] - General Case Data Audit                                                                   02/07/21 15:31:21         Owner: [id number]                              Modifier: [id number]                                                       <+>  1         ASA Class        [facility] - Fire Risk Assessment                                                                                               Entry 1                                                                                                          Fire Risk                 Alcohol Based Prep              Fire Risk Score                 2    Assessment: If            Solution, Ignition    checked, checkmark        Source In Use    = 1 point     Fire Risk Protocol     (Reference Only)      0-1                      If using alcohol based          2                               Close open bottles of                              solutions prep, use the                                         flammable agents.,                              minimal amount needed.,                                         Check all equipment                              Allow sufficient drying  before use.                              time per manufacturers                              recommendations to                              allow the dissipation                              of fumes., Do not drape                               until the prep area is                              fully dry., Do not                              allow pooling of any                              prep solution                              (including under the                              patient)., Remove all                              bowls of volatile                              solutions and saturated                              prep materials from the                              field after use.,                              Utilize standard                              draping procedure.,                              Protect all heat                              sources when not in use                              (  cautery pencil                              holster, laser in                              stand-by mode, etc).,                              Activate heat source                              only when active tip is                              in line of sight and in                              contact with tissue.    Last Modified By:         Jonette Pesa RN, Krystal Eaton                              02/07/21 15:31:40      [facility] - Time Out                                                                                 Pre-Care Text:            A.10 Confirms patient identity  A.20 Verifies operative procedure, surgical site, and laterality  A.20.1           Verifies consent for planned procedure  A.30 Verifies allergies                              Entry 1                                                                                                          Procedure                 Knee Arthroscopy                Patient name and                Yes  Operative(Left)                 DOB confirmed.                                                               Allergies                                                               confirmed. Surgical                                                                procedure to be                                                               performed confirmed                                                               and verified by                                                               completed surgical                                                               consent     Surgical site             Yes                             Essential imaging,              Yes    confirmed. Correct                                        required blood     surgical site  products, implants,     marked and initials                                       devices and/or     are visible through                                       special equipment     prepped and draped                                        available and     field (or                                                 sterilization     alternative ID band                                       indicators confirmed     used), if applicable     Surgeon shares            Yes                             Anesthesia shares               Yes    operative plan,                                           anesthetic plan and     anticipated                                               reviews patient     specimens                                                 specific concerns     discussed, possible                                       and confirms     difficulties,                                             administration of     expected duration,  antibiotics, if     anticipated blood                                         applicable     loss and reviews     all     critical/specific     concerns     Fire risk                 Yes                             Surgeon states:                 Yes    assessment scored                                         Does anyone have     and plan discussed                                         any concerns? If                                                               you see, suspect,                                                               or feel that                                                               patient care is                                                               being compromised,                                                               speak up for  patient safety     Time Out Complete         02/07/21 15:42:00    Last Modified By:         Jonette Pesa RNKrystal Eaton                              02/07/21 15:51:28    Post-Care Text:            E.30 Evaluates verification process for correct patient, site, side, and level surgery      [facility] - Debrief                                                                                  Pre-Care Text:            Im.330 Manages specimen handling and disposition                              Entry 1                                                                                                          Procedure                 Knee Arthroscopy                Final counts                    Yes                              Operative(Left)                 correct and                                                               verbally verified                                                               with  surgeon/licensed                                                               independent                                                               practitioner (if                                                               applicable)     Actual procedure          Yes                             Postop diagnosis                Yes    performed confirmed                                       confirmed     Wound                     Yes                             Confirm specimens                Yes    classification                                            and specimens     confirmed                                                 labeled                                                               appropriately (if                                                               applicable)  Equipment problems        Yes                             Foley catheter                  Yes    addressed (if                                             removed (if     applicable)                                               applicable)     Patient recovery          Yes                             Debrief Complete                02/07/21 16:09:00    plan confirmed     Last Modified By:         Jonette Pesa RN, Krystal Eaton                              02/07/21 16:09:06    Post-Care Text:            E.800 Ensures continuity of care  E.50 Evaluates results of the surgical count  O.30 Patient's procedure is           performed on the correct site, side, and level  O.50 patient's current status is communicated throughout the           continuum of care  O.40 Patient's specimen(s) is managed in the appropriate manner      [facility] - Debrief Audit                                                                             02/07/21 16:09:06         Owner: [id number]                              Modifier: [id number]                                                           1     <*> Procedure                              Knee Arthroscopy Operative(Left)            1     <+>  Debrief Complete        [facility] - Patient Care Devices                                                                     Pre-Care Text:            A.200 Assesses risk for normothermia regulation  A.40 Verifies presence of prosthetics or corrective devices           Im.280 Implements thermoregulation measures  Im.60 Uses supplies and equipment within safe parameters                              Entry 1                         Entry 2                                                                           Equipment Type            MACHINE SEQUENTIAL              BAIR HUGGER                              COMPRESSION    SCD Sleeve Site           Leg Right    Equipment/Tag Number      [id number]                       [id number]    Initiated Pre             Yes    Induction     Last Modified By:         Jonette Pesa, RN, Garen Grams, RN, Krystal Eaton                              02/07/21 15:50:06               02/07/21 15:50:06    Post-Care Text:            E.10 Evaluates signs and symptoms of physical injury to skin and tissue  O.60 Patient is free from signs and           symptoms of injury caused by extraneous objects      [facility] - Tourniquet  Pre-Care Text:            A.63 Verifies procedure, surgical site, and laterality  A.220.2 Identifies baseline tissue perfusion  A.240           Assesses baseline skin condition  A.40 Verifies presence of prosthetics or corrective devices  Im.120           Implements protective measures to prevent skin or tissue injury due to mechanical sources   Im.60 Uses supplies           and equipement within safe parameters  Im.80 Applies safety devices                              Entry 1                                                                                                          Tourniquet Type           TOURNIQUET PNEUMATIC            Serial Number                   [id number]    Setting                   250 mmHg                        Placement                       Leg Upper Left    Padding (Im.120)          Yes    Tourniquet Times      Inflated                 02/07/21 15:42:00               Deflated                        02/07/21 16:09:00    Applied By                Cline Crock,  ROBERT-MD          Outcome Met (O.60)              Yes    Last Modified By:         Jonette Pesa, RN, Krystal Eaton                              02/07/21 16:09:16    Post-Care Text:             E.10 Evaluates for signs and symptoms of physical injury to skin and tissue  E. 270 Evaluates tissue perfusion           O.210 Patient's tissue perfusion is consisent with or improved from baseline levels  O.30 Patient's procedure  is performed on the correct site, side, level  O.60 Patient is free from sign and symptoms of injury caused by           extraneous objects      [facility] - Tourniquet Audit                                                                          02/07/21 16:09:16         Owner: [id number]                              Modifier: [id number]                                                           1     <*> Tourniquet Type                        TOURNIQUET PNEUMATIC            1     <+> Deflated            1     <+> Outcome Met (O.60)        [facility] - Surgical Fluid Management                                                                Pre-Care Text:            A.280 Verifies allergies  A.310 Identifies factors associated with an increased risk for hemorrhage or fluid           and electrolyte imbalance  Im.210 Administers prescribed solutions  A.280.1 Implements protective measures to           prevent skin or tissue injury due to thermal sources                              Entry 1                                                                                                          Irrigant                  Lactated ringers  Additive                        24m EPI    Outcome Met (O.300)       Yes    Last Modified By:         CJonette Pesa RN, HKrystal Eaton                             02/07/21 15:53:04    Post-Care Text:            E.10 Evaluates for signs and symptoms of physical injury to skin and tissue  O.10 Patient is free from signs           and symptoms of injury due to thermal sources  O.100 Patient is free from signs and symptoms of chemical injury      [facility] - Medications                                                                              Pre-Care  Text:            A.10 Confirms patient identity  A.30 Verifies allergies  Im.220 Administers prescribed medications  Im.220.2           Administers prescribed antibiotic therapy as ordered                              Entry 1                         Entry 2                                                                          Time Administered     Medication                ROPIVACAINE HCL                 MORPHINE SULFATE                              INJECTION 5MG/ML  30 ML         INJECTION 4MG/ML 1 ML    Route of Admin            Local Injection                 Local Injection    Dose/Volume               9650m                            50m74m  (include amount and     unit of measure)  Site                      Knee                            Knee    Site Detail               Left                            Left    Administered By           Remi Haggard,  ROBERT-MD    Outcome Met (O.130)       Yes                             Yes    Last Modified By:         Jonette Pesa, RN, Garen Grams, RN, Krystal Eaton                              02/07/21 15:32:07               02/07/21 15:32:07    Post-Care Text:            E.20 Evaluates response to medications  O.130 Patient receives appropriately administerd medication(s)      [facility] - Dressing/Packing                                                                         Pre-Care Text:            A.350 Assesses susceptibility for infection  Im.250 Administers care to invasive devices  Im.290 Administer           care to wound sites   Im.300 Implements aseptic technique                              Entry 1                                                                                                          Site                      Knee                            Site Details  Left    Dressing Item     Details      Dressing Item            Elastic wrap, 4x4's,     (Im.290)                 Occlusive Dressing,                               Other (See Comment),                              Wound Closure Strip    Last Modified By:         Jonette Pesa RN, Krystal Eaton                              02/07/21 15:53:28    Post-Care Text:            E.320 Evaluate factors associted with increased risk for postoperative infection at the completion of the           procedure  O.200 Patient's wound perfusion is consistent with or improved from baseline levels   O.Patient is           free from signs and symptoms of infection    General Comments:            mastisol      [facility] - Procedures                                                                               Pre-Care Text:            A.20 Verifies operative procedure, surgical site, and laterality  Im.150 Develops individualized plan of care                              Entry 1                                                                                                          Procedure     Description      Procedure                Knee Arthroscopy                Modifiers                       Left  Operative     Surgical Procedure       LEFT KNEE ARTHROSCOPY     Text                     PARTIAL MEDIAL                              MENISCECTOMY SHAVING                              CHONDROPLASTY    Primary Procedure         Yes                             Primary Surgeon                 Juluis Mire    Start                     02/07/21 15:43:00               Stop                            02/07/21 16:07:00    Anesthesia Type           General                         Surgical Service                Orthopedic (SN)    Wound Class               1-Clean    Last Modified By:         Timmothy Sours                              02/07/21 16:26:00    Post-Care Text:            O.730 The patinet's care is consistent with the individualized perioperative plan of care      [facility] - Procedures Audit                                                                           02/07/21 16:26:00         Owner: [id number]                              Modifier: [id number]                                                       <+> 1         Start        [facility] - Transfer  Entry 1                                                                                                          Transferred By            Jonette Pesa, RN, Hayden           Via                             Berle Mull                              [facility]    Post-op Destination       PACU    Skin Assessment      Condition                Intact    Last Modified By:         Timmothy Sours                              02/07/21 15:53:36      Case Comments                                                                                         <None>              Finalized By: Timmothy Sours      Document Signatures                                                                             Signed By:           Timmothy Sours 02/07/21 16:26

## 2021-02-07 NOTE — Op Note (Signed)
Phase II Record - [facility]             Phase II Record - [facility] Summary                                                                  Primary Physician:        Estill Batten,  ROBERT-MD    Case Number:              [id number]    Finalized Date/Time:      02/07/21 17:21:04    Pt. Name:                 Dominic Knight, Dominic Knight    D.O.B./Sex:               [birth year]    Male    Med Rec #:                [id number]    Physician:                Florinda Marker    Financial #:              [id number]    Pt. Type:                 S    Room/Bed:                 /    Admit/Disch:              02/07/21 07:36:00 -    Institution:       [facility] Case Attendance - Phase II                                                                                           Entry 1                                                                                                          Case Attendee             Merceda Elks            Role Performed                  Post Anesthesia Care  Nurse    Time In                   02/07/21 16:43:00               Time Out                        02/07/21 17:15:00    Last Modified By:         Adela Lank E-RN                              02/07/21 17:20:45      [facility] - Case Times - Phase II                                                                                              Entry 1                                                                                                          Phase II In               02/07/21 16:43:00               Phase II Out                    02/07/21 17:15:00    Phase II Discharge        02/07/21 17:15:00    Time     Last Modified By:         Adela Lank E-RN                              02/07/21 17:20:57              Finalized By: Adela Lank E-RN      Document Signatures                                                                             Signed By:            Adela Lank E-RN 02/07/21 17:21

## 2021-02-07 NOTE — Case Communication (Signed)
Discharge Follow-Up Form - Text       Discharge Follow-Up Entered On:  02/12/2021 12:18 EDT    Performed On:  02/12/2021 12:17 EDT by Graciella Freer, RN, KELLY E               Clinical   Provider Follow-Up Post Discharge RTF :   Follow-Up Appointments    With:  [facility]  Address:  business (1), [address], [place], [place], [address];[phone] Business (1)  When:  In 2 weeks  Comment:  Call Irving Burton [phone] for appointment day and time.    With:  [employer]  Address:  business (1), [address], [place], [place], [address];[phone] Business (1)       REDICK, RN, Claria Dice - 02/12/2021 12:17 EDT   Status   Case Status :   Completed   Reason for Removal :   Attempt Limit, Other - Add comment to explain   Other Comment :   1st call KA 7/15   REDICK, RN, KELLY E - 02/12/2021 12:17 EDT
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
## 2021-02-07 NOTE — Case Communication (Signed)
Discharge Follow-Up Form - Text       Discharge Follow-Up Entered On:  02/12/2021 10:51 EDT    Performed On:  02/12/2021 10:49 EDT by Nicholaus Bloom RN, Hulda Humphrey               Clinical   Provider Follow-Up Post Discharge RTF :   Follow-Up Appointments    With:  [facility]  Address:  business (1), [address], [place], [place], [address];[phone] Business (1)  When:  In 2 weeks  Comment:  Call Irving Burton [phone] for appointment day and time.    With:  [employer]  Address:  business (1), [address], [place], [place], [address];[phone] Business (1)       Nicholaus Bloom, RN, Hulda Humphrey - 02/12/2021 10:49 EDT   Surgery Evaluation   CM Surg DC Instr Clr :   Yes   CM Surg Pain Controlled :   No   CM Surg Nausea/Vomiting :   No   CM Surg FU Appt :   No   CM Surg Staff Recognize :   No   Nicholaus Bloom RN, Hulda Humphrey - 02/12/2021 10:49 EDT

## 2021-02-07 NOTE — Op Note (Signed)
Phase I Record - [facility]             Phase I Record - [facility] Summary                                                                   Primary Physician:        Estill Batten,  ROBERT-MD    Case Number:              [id number]    Finalized Date/Time:      02/07/21 17:20:15    Pt. Name:                 Dominic Knight, Dominic Knight    D.O.B./Sex:               04/17/1956    Male    Med Rec #:                [id number]    Physician:                Florinda Marker    Financial #:              [id number]    Pt. Type:                 S    Room/Bed:                 /    Admit/Disch:              02/07/21 07:36:00 -    Institution:       [facility] Case Attendance - Phase I                                                                                            Entry 1                         Entry 2                                                                          Case Attendee             Benancio Deeds    Role Performed            Surgeon Primary                 Post Anesthesia Care  Nurse    Time In                                                   02/07/21 16:12:00    Time Out                                                  02/07/21 16:42:00    Last Modified By:         Doylene Canning,  Diane E-RN                              02/07/21 17:19:54               02/07/21 17:19:54      [facility] - Case Times - Phase I                                                                                               Entry 1                                                                                                          Phase I In                02/07/21 16:12:00               Phase I Out                     02/07/21 16:42:00    Phase I Discharge         02/07/21 16:42:00    Time     Last Modified By:         Adela Lank E-RN                              02/07/21 17:20:13              Finalized By: Adela Lank  E-RN      Document Signatures  Signed By:           Adela Lank E-RN 02/07/21 17:20

## 2021-02-07 NOTE — Case Communication (Signed)
Discharge Follow-Up Form - Text       Discharge Follow-Up Entered On:  02/08/2021 17:52 EDT    Performed On:  02/08/2021 17:51 EDT by Ulis Rias RN, Ranae Plumber               Clinical   Provider Follow-Up Post Discharge RTF :   Follow-Up Appointments    With:  [facility]  Address:  business (1), [address], [place], [place], [address];[phone] Business (1)  When:  In 2 weeks  Comment:  Call Irving Burton [phone] for appointment day and time.    With:  [employer]  Address:  business (1), [address], [place], [place], [address];[phone] Business (1)       Appis, RN, Ranae Plumber - 02/08/2021 17:51 EDT   Status   Case Status :   Incomplete   Reason for Removal :   Other - Add comment to explain   Phone Call History Post DC (Readmit) :   First call   Other Comment :   1st call KA 7/15   TCM Call Outcome :   No answer/unable to reach   Appis, RN, Ranae Plumber - 02/08/2021 17:51 EDT

## 2021-03-01 ENCOUNTER — Encounter

## 2021-03-01 NOTE — Telephone Encounter (Signed)
PATIENT HAD SX ON 7/14 AND STS THE FLUID IN HIS KNEE HAS NOT GONE DOWN, PLEASE CALL.

## 2021-03-01 NOTE — Telephone Encounter (Signed)
Spoke with patient and discussed using ice, compression and elevation to assist with swelling.  Also discussed taking tylenol and ibuprofen vs. Tylenol and mobic.    He asked if he could get another Rx for his pain medication.

## 2021-03-02 MED ORDER — OXYCODONE HCL 5 MG PO TABS
5 MG | ORAL_TABLET | ORAL | 0 refills | Status: AC | PRN
Start: 2021-03-02 — End: 2021-03-09

## 2021-03-14 ENCOUNTER — Encounter: Payer: BLUE CROSS/BLUE SHIELD | Attending: Surgical | Primary: Diagnostic Radiology

## 2021-03-20 ENCOUNTER — Encounter: Payer: BLUE CROSS/BLUE SHIELD | Attending: Orthopaedic Surgery | Primary: Diagnostic Radiology

## 2021-07-19 ENCOUNTER — Encounter

## 2021-08-01 ENCOUNTER — Ambulatory Visit: Payer: BLUE CROSS/BLUE SHIELD | Primary: Diagnostic Radiology

## 2021-08-07 ENCOUNTER — Ambulatory Visit: Payer: BLUE CROSS/BLUE SHIELD | Primary: Diagnostic Radiology

## 2021-08-14 NOTE — Telephone Encounter (Signed)
[facility] [phone]    MALCOLM NOT AVAILABLE.   REQUESTING A CALL BACK CONCERNING MRI AND PATIENT'S INSURANCE. PLEASE CALL. THANK YOU.

## 2021-08-21 ENCOUNTER — Inpatient Hospital Stay: Admit: 2021-08-21 | Payer: BLUE CROSS/BLUE SHIELD

## 2021-08-21 DIAGNOSIS — M25511 Pain in right shoulder: Secondary | ICD-10-CM

## 2021-11-11 ENCOUNTER — Ambulatory Visit: Admit: 2021-11-11 | Discharge: 2021-11-11 | Payer: BLUE CROSS/BLUE SHIELD | Attending: Orthopaedic Surgery

## 2021-11-11 ENCOUNTER — Ambulatory Visit: Admit: 2021-11-11 | Discharge: 2021-11-11 | Payer: BLUE CROSS/BLUE SHIELD

## 2021-11-11 DIAGNOSIS — S83242A Other tear of medial meniscus, current injury, left knee, initial encounter: Secondary | ICD-10-CM

## 2021-11-11 DIAGNOSIS — M25561 Pain in right knee: Secondary | ICD-10-CM
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
## 2021-11-11 NOTE — Progress Notes (Signed)
[facility]  [facility]     PROVIDER:  Roxanne MinsWilliam J Jim Philemon, MD   Date of Encounter:  11/11/2021     PATIENT INFORMATION:   Name: Dominic Leniserry W Buccellato Sr.  Age:  66 y.o.  Sex:  male  DOB:  1956/03/16     MRN:  [id number]  Cell Phone:    Telephone Information:   Mobile [phone]        Address:  [address]  [place] [id number]   PCP:  Jessica SwazilandJordan Hund, MD  Insurance:  Payor: BCBS / [facility] [place] / Product Type: *No Product type* /         CHIEF COMPLAINT:       Chief Complaint   Patient presents with    New Patient     BILATERAL KNEE PAIN        History of Present Illness:    The patient is a 66 y.o. year-old male who presents today as a new patient consult from the [facility] for complaint of bilateral knee pain left greater than right.  Due to the acute nature of his left knee pain we will concentrate on this today.  The patient states that on 09/12/2021 he fell out of the tree and he landed on his left knee and he felt it below and crack resulting in severe pain in the knee.  He localizes the pain to the anterior medial aspect of the knee.  Sometimes it will radiate up to the hip.  He describes it as a shooting pain.  It is worse with standing from a seated position, bending and standing or going up stairs.  He has no associated swelling.  He denies radiation, numbness or tingling.  He presented to the [facility] and an MRI was obtained which revealed that the patient has a diminutive medial meniscus with partial-thickness tearing.  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  There is an anterior lateral compartment multilobulated cyst.  He was treated conservatively with activity modification, ice, naproxen and oxycodone.  He states that he has tried physical therapy in the past which has not been helpful.  He notes that he underwent an arthroscopic surgery 15 to 20 years ago with a good result.  He now presents today for further  evaluation and treatment.    Of note, he also underwent an arthroscopic surgery for his contralateral right knee on 06/07/2020.        PHYSICAL EXAMINATION:     General: Patient is alert and oriented to person, place and time. Well-developed, well-nourished, well groomed, in no acute distress. Normal mood and affect, cooperative, appropriate attitude.     Cardiovascular: Regular rhythm by palpation of distal pulse, normal color and temperature, no pitting edema of uninvolved LE     Pulmonary: breathing comfortably, normal and symmetric chest expansion, no intercostal retractions     Musculoskeletal:    Examination of the left knee reveals that skin is intact.  There is no significant swelling or effusion.  Range of motion is from 0 to 120 degrees with pain and tightness at the extremes of flexion.  He has medial joint line tenderness.  He has a positive McMurray's on the medial side.  He has a negative Lachman and negative posterior drawer.  He is stable to varus and valgus stress.  He has 5 out of 5 strength.  Sensation is intact.  He has less than 2-second capillary refill.  IMAGING:     An MRI was performed on the left knee at the [facility] on 10/26/2021.  I reviewed the report.  Also performed an independent interpretation of the images which reveals that the patient has a diminutive medial meniscus with partial-thickness tearing.  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  There is an anterior lateral compartment multilobulated cyst.    ASSESSMENT(S):     Diagnosis Orders   1. Right knee pain, unspecified chronicity  XR KNEE RIGHT (3 VIEWS)      2. Acute medial meniscus tear, left, initial encounter        3. Chondromalacia, left knee        4. Cyst of left knee joint            TREATMENT:     I had a very long discussion with the patient where I reviewed his MRI findings and correlated them with his physical exam findings.  I explained that fortunately he  did not have any fractures or ligamentous tears or injuries.  However, he has a small medial meniscus tear, chondromalacia especially affecting the medial patellofemoral compartments as well as a multilobulated cyst likely contributing to his pain and symptoms.  I discussed continued conservative treatment with anti-inflammatories, physical therapy, and injections versus arthroscopic intervention consisting of a partial medial meniscectomy, chondroplasty, cyst debridement, PRP injection and associated procedures.  I discussed the surgery as well as expected recovery time.  Also discussed risks as well as benefits of the surgery which include but not limited to the risk of infection, neurovascular injury resulting in loss of function or loss of sensation, continued pain, need for future revision surgery as well as the attendant risks of anesthesia.  After discussion, the patient states that he has not improved with conservative treatment and would like to proceed with arthroscopic intervention.     Follow Up:     Scheduled for arthroscopic surgery    Procedures:       DME:         Orders:     Orders Placed This Encounter   Procedures    XR KNEE RIGHT (3 VIEWS)     PATIENT IN WAITING ROOM, PLEASE SEND TO ROOM 1 WHEN IMAGING IS COMPLETE     Standing Status:   Future     Number of Occurrences:   1     Standing Expiration Date:   11/12/2022     Scheduling Instructions:      PATIENT IN WAITING ROOM, PLEASE SEND TO ROOM 1 WHEN IMAGING IS COMPLETE     Order Specific Question:   Reason for exam:     Answer:   PATIENT IN WAITING ROOM, PLEASE SEND TO ROOM 1 WHEN IMAGING IS COMPLETE        Rx:     Outpatient Encounter Medications as of 11/11/2021   Medication Sig Dispense Refill    hydroCHLOROthiazide (HYDRODIURIL) 25 MG tablet as directed Orally      GNP PAIN RELIEF EX-STRENGTH 500 MG tablet TAKE TWO TABLETS BY MOUTH EVERY 8 HOURS FOR PAIN FOR 30 DAYS      GNP ASPIRIN LOW DOSE 81 MG EC tablet TAKE ONE TABLET BY MOUTH DAILY FOR  FOURTEEN DAYS, INSTR BLOOD CLOT PREVENTION      Cyanocobalamin (VITAMIN B 12) 500 MCG TABS       erythromycin (ROMYCIN) 5 MG/GM ophthalmic ointment APPLY A 0.5 INCH RIBBON INTO THE EYE FOUR TIMES DAILY FOR  5 DAYS      OZEMPIC, 1 MG/DOSE, 4 MG/3ML SOPN       triamcinolone (KENALOG) 0.1 % cream Apply a small amount topically three times daily if needed to itchy rash      meloxicam (MOBIC) 15 MG tablet 1 tablet Orally Once a day for 30 day(s)      Naldemedine Tosylate (SYMPROIC) 0.2 MG TABS TAKE 1 TABLET BY MOUTH EVERY DAY Oral for 30      oxyCODONE-acetaminophen (PERCOCET) 10-325 MG per tablet (Schedule II Drug) TAKE 1 TABLET BY MOUTH THREE TIMES A DAY PRN PAIN Oral for 30       No facility-administered encounter medications on file as of 11/11/2021.        PAST MEDICAL HISTORY:   Past Medical History:   Diagnosis Date    Anxiety     Depression     HBP (high blood pressure)     High cholesterol         PAST SURGICAL HISTORY:   Past Surgical History:   Procedure Laterality Date    CERVICAL SPINE SURGERY      x2    KNEE ARTHROSCOPY Right     x2    LUMBAR SPINE SURGERY      x3    SHOULDER ARTHROSCOPY Right 07/07/2019         FAMILY HISTORY:   No family history on file.     SOCIAL HISTORY:   Social History     Occupational History    Not on file   Tobacco Use    Smoking status: Former     Types: Cigarettes    Smokeless tobacco: Not on file   Substance and Sexual Activity    Alcohol use: Yes    Drug use: Not on file    Sexual activity: Not on file       MEDICATIONS:   Current Outpatient Medications   Medication Sig Dispense Refill    hydroCHLOROthiazide (HYDRODIURIL) 25 MG tablet as directed Orally      GNP PAIN RELIEF EX-STRENGTH 500 MG tablet TAKE TWO TABLETS BY MOUTH EVERY 8 HOURS FOR PAIN FOR 30 DAYS      GNP ASPIRIN LOW DOSE 81 MG EC tablet TAKE ONE TABLET BY MOUTH DAILY FOR FOURTEEN DAYS, INSTR BLOOD CLOT PREVENTION      Cyanocobalamin (VITAMIN B 12) 500 MCG TABS       erythromycin (ROMYCIN) 5 MG/GM ophthalmic  ointment APPLY A 0.5 INCH RIBBON INTO THE EYE FOUR TIMES DAILY FOR 5 DAYS      OZEMPIC, 1 MG/DOSE, 4 MG/3ML SOPN       triamcinolone (KENALOG) 0.1 % cream Apply a small amount topically three times daily if needed to itchy rash      meloxicam (MOBIC) 15 MG tablet 1 tablet Orally Once a day for 30 day(s)      Naldemedine Tosylate (SYMPROIC) 0.2 MG TABS TAKE 1 TABLET BY MOUTH EVERY DAY Oral for 30      oxyCODONE-acetaminophen (PERCOCET) 10-325 MG per tablet (Schedule II Drug) TAKE 1 TABLET BY MOUTH THREE TIMES A DAY PRN PAIN Oral for 30       No current facility-administered medications for this visit.        ALLERGIES:   Allergies   Allergen Reactions    Gabapentin Anxiety and Hallucinations    Prochlorperazine Anxiety and Other (See Comments)     Event:    Dizziness  REACTION: "Flighty"  compazine  REVIEW OF SYSTEMS:     Review of Systems   Musculoskeletal: see HPI for orthopaedic medical issues     Notes: Patient is to continue all medications as directed by prescribing physicians. Continuations on today's visit are made based on the patient's report of current medications.      Current Meds Verified: Current meds/immunizations reviewed, including purpose with pt. Med Recon list given to pt/family. Pt advised to discard old med lists and provide all providers with current list at each visit and carry list with them in case of emergency     This document was created using voice recognition software so mistakes are possible. For any concerns about the wording of this document, please contact its creator for further clarification.

## 2021-11-14 NOTE — Telephone Encounter (Signed)
LVM for patient regarding message from Claudette Stapler that he may have had some questions regarding surgery set for 01/07/22 for left knee scope.  I explained that Dr. Kayleen Memos discussed the MRI results with the patient when he was seen on 11/11/21 and discussed what the surgery may contain. I provided main line for any further questions.

## 2021-11-14 NOTE — Telephone Encounter (Signed)
SWP and discussed upcoming surgery scheduled 01/07/22. Patient was asking if there was an ACL interaction scheduled for the surgery. I explained that there was just menisectomy scheduled. Patient wanted to know why his ACL was on the MRI report and not discussed in the appointment. I explained that the MRI report did not suggest any tearing of the ACL, but the present of cysts in the area. I explained that if Dr. Noralyn Pick felt it was necessary to either drain or remove these cysts around the ACL, he would do so during the surgery with the appropriate equipment. Patient voiced understanding and gratitude.

## 2021-11-18 NOTE — Telephone Encounter (Signed)
SWP and confirmed he had no further questions at this time. Confirmed sx date 01/07/22.

## 2021-11-18 NOTE — Telephone Encounter (Signed)
Patient requesting return call.

## 2021-12-05 NOTE — Telephone Encounter (Signed)
[id number] -----approved  [id number]--------approved  [id number]------not in the approved list.    [facility] if [facility].

## 2021-12-05 NOTE — Telephone Encounter (Signed)
-----   Message from Weyman Croon sent at 12/05/2021 10:18 AM EDT -----  SX SCHEDULED FOR 01/07/22- [facility]- SPOKE TO PT , WENT OVER SX INFO-  PLEASE CALL PATIENT TO GO OVER SURGERY DETAILS.

## 2021-12-05 NOTE — Telephone Encounter (Signed)
LVM for patient regarding left knee arthroscopy scheduled for 01/07/22 at 11am. I provided main line and surgery scheduling contact information if patient had any further questions.

## 2021-12-11 NOTE — Telephone Encounter (Signed)
PATIENT WANTS TO KNOW IF HE CAN HAVE HIS SX BEFORE 6/13, PLEASE CALL.

## 2022-01-01 NOTE — Telephone Encounter (Signed)
SWP and discussed the post surgery recovery process. It was explained to the patient that generally, he should be able to be off of crutches and walking in approximately 1-3 days post-op and returning to driving typically takes about 2 weeks. However, to return to driving, the patient must be weight bearing as well as no longer be taking the prescribed narcotics after surgery. Setting up post-op PT was discussed but the patient wished to wait until his first post-op appointment in order to set up his PT.    Patient expressed gratitude for call.

## 2022-01-01 NOTE — Progress Notes (Signed)
Pre Procedure Patient Instructions    Procedure Location hospital:[facility] [address] [place]- Arrival 6:30a-4:30p: Enter building at [employer], turn right and follow hallway to [employer], then take stairs or elevator to 2nd floor waiting room.   Procedure Date 01/07/22  Arrival Time Per Physicians Instructions     Medications:  Medication to be taken the morning of surgery with a few sips of water only: PERCOCET  NEXIUM IF NEEDED  Hold or reduce the dosage of the following medication, as discussed: HOLD MOBIC VITAMINS      Stop all supplements, vitamins and herbal remedies one week prior to your procedure, unless your doctor told you to continue taking.  Do not take over the counter pain medications except plain Tylenol or Acetaminophen unless your doctor told you to do so.  If you are taking blood thinners, call the doctor performing your procedure and prescribing physician for instructions on when to stop.    Procedure Preparation    Diet Restrictions:No food or drink including gum or mints -after midnight    Skin Preparation:   Wash with Hibiclens or an antibacterial soap (e.g. Dial soap) the night before and morning of procedure.  Do not put on any deodorants, lotions, powders, or oils afterwards.  Be sure to put on clean clothes    Other Preparation:  Call your surgeon right away if you get any wounds, cuts, scrapes, scabs, rashes, bug bites at or near your operative site or if you have any fever, cold or flu symptoms.    Complete tests for surgery by 01/03/22 at [facility]: [address] [place], [address], [place]: M-F 7a-5p; You do not need to fast. Bring your picture ID and insurance card. Inform the receptionist you are there for testing before your surgery. Tell them the name of your doctor. If you have any issue, call [phone] K+      Day of Procedure Patient Instruction:  Do not smoke, vape, chew tobacco, drink alcohol or use recreational drugs on the day of your  procedure  Remove all jewelry, piercings, and metal accessories  Do not wear contacts, tampons, make-up, lotions, creams, powders, fragrances or deodorant  Do not bring valuables or money  Bring a copy of your Living Will and/or Medical Durable Power of Attorney if you have one  Bring a list of current medications including name and dosage  Bring a picture ID and insurance card and any of the following that are applicable to you:     Inhalers      CPAP or BiPAP machine     Remote for spinal cord stimulator or other implanted device     Insulin pump supplies     Walker or other orthopedic device necessary for postop     Storage case for eyeglasses, hearing aids, dentures, etc     A loose button-up shirt if instructed          .  If you are going home the same day as your procedure, a support person should accompany you to the facility and must transport you home.  If you plan to take public transportation of any sort, your support person must accompany you home.  You will need someone to stay with you for 24 hours after your procedure with sedation of any kind.         No COVID testing required as discussed.       CommentsLouie Bun    The  information and visitor policy was reviewed with you during your Pre-Admission Testing interview and you verbalized understanding. If you have any additional questions please contact [phone]    For financial questions regarding your procedure at a [facility] facility, please contact [phone], option 1    For financial questions regarding anesthesia at a [facility] facility, please  contact [phone]

## 2022-01-01 NOTE — Telephone Encounter (Signed)
The patient is requesting a phone call regarding questions about post surgery of the left knee.

## 2022-01-03 ENCOUNTER — Inpatient Hospital Stay: Admit: 2022-01-03 | Payer: MEDICARE

## 2022-01-03 DIAGNOSIS — Z79899 Other long term (current) drug therapy: Secondary | ICD-10-CM

## 2022-01-03 LAB — POTASSIUM: Potassium: 4.5 mmol/L (ref 3.5–5.3)

## 2022-01-06 NOTE — H&P (Signed)
PlanPRE-OP HISTORY AND PHYSICAL             Date: 01/06/2022        Patient Name: Dominic Hoveerry W Heuberger Sr.     Date of Birth: [birth year]      Age:  66 y.o.    Chief Complaint   Left knee pain    History of Present Illness   Dominic Leniserry W Luhman Sr. is a 66 y.o. male of left knee pain.  It started on 09/12/2021 when he fell out of a tree and landed on the left knee.  The pain is medial and shooting, occasionally radiating up to the hip.  It is worse with standing from a seated position, flexing, and going up and down the stairs.  An MRI was obtained at the [facility] that showed a diminutive medial meniscus with partial-thickness tearing, chondromalacia of the medial and patellofemoral compartments, and an anterior lateral multilobulated cyst.  The patient has attempted relief with activity modification, ice, naproxen, oxycodone, and physical therapy without improvement.  Dr. Noralyn Pickarroll therefore discussed arthroscopic surgery, partial medial meniscectomy, cyst debridement, PRP injection, and associated procedures, for which the patient presents.    Past Medical History     Past Medical History:   Diagnosis Date    Acid reflux     Anxiety     Claustrophobia     Depression     HBP (high blood pressure)     High cholesterol     Shoulder pain     Snorings     Tear meniscus knee     Wears glasses         Past Surgical History     Past Surgical History:   Procedure Laterality Date    CERVICAL SPINE SURGERY      x2    COLONOSCOPY      KNEE ARTHROSCOPY Right     x2    LUMBAR SPINE SURGERY      x3    SHOULDER ARTHROSCOPY Right 07/07/2019        Medications Prior to Admission     Prior to Admission medications    Medication Sig Start Date End Date Taking? Authorizing Provider   esomeprazole Magnesium (NEXIUM) 20 MG PACK Take 1 packet by mouth nightly as needed (INDIGESTION)   Yes Historical Provider, MD   Omega-3 Fatty Acids (FISH OIL) 1000 MG capsule Take 1 capsule by mouth daily   Yes Historical Provider, MD   hydroCHLOROthiazide (HYDRODIURIL) 25 MG  tablet Take 1 tablet by mouth daily    Historical Provider, MD   GNP PAIN RELIEF EX-STRENGTH 500 MG tablet Take 1 tablet by mouth every 8 hours as needed for Pain 02/07/21   Historical Provider, MD   Cyanocobalamin (VITAMIN B 12) 500 MCG TABS Take 1 tablet by mouth every morning 02/26/21   Historical Provider, MD   triamcinolone (KENALOG) 0.1 % cream 0.1 g 3 times daily 02/06/21   Historical Provider, MD   meloxicam (MOBIC) 15 MG tablet Take 1 tablet by mouth nightly as needed for Pain 12/07/20 02/05/21  [facility] Automatic Reconciliation, MD   oxyCODONE-acetaminophen (PERCOCET) 10-325 MG per tablet Take 1 tablet by mouth every 8 hours as needed for Pain.    [facility] Automatic Reconciliation, MD        Allergies   Gabapentin and Prochlorperazine    Social History     Social History       Tobacco History       Smoking Status  Former Quit Date  2000 Smoking Tobacco Type  Cigarettes quit in 2000      Smokeless Tobacco Use  Never              Alcohol History       Alcohol Use Status  Yes Drinks/Week  1 Glasses of wine per week Amount  1.0 standard drink of alcohol/wk              Drug Use       Drug Use Status  Never              Sexual Activity       Sexually Active  Not Asked                    Family History   History reviewed. No pertinent family history.    Review of Systems   Constitutional: No fevers, chills, sweats  Eye: No recent visual problems  Respiratory: No shortness of breath, cough  Cardiovascular: No Chest pain, palpitations  Gastrointestinal: No nausea, vomiting, diarrhea  Musculoskeletal: See HPI  Integumentary: No rash, pruritus, abrasions  Neurologic: Alert & oriented  Psychiatric: No anxiety, depression    Physical Exam   Ht 6' (1.829 m)   Wt 225 lb (102.1 kg)   BMI 30.52 kg/m   GENERAL APPEARANCE: well developed, well nourished, in no acute distress, Patient is A&O.   HEAD: normocephalic, atraumatic.   EYES: pupils equal, round, sclera non-icteric.   EARS: normal.   NECK: neck supple, full range of  motion.   HEART: regular rate and rhythm.   LUNGS: normal,good air movement.   ABDOMEN: soft, nontender.   ORTHOPEDIC:  Examination of the left knee reveals that skin is intact.  There is no significant swelling or effusion.  Range of motion is from 0 to 120 degrees with pain and tightness at the extremes of flexion.  He has medial joint line tenderness.  He has a positive McMurray's on the medial side.  He has a negative Lachman and negative posterior drawer.  He is stable to varus and valgus stress.  He has 5 out of 5 strength.  Sensation is intact.  He has less than 2-second capillary refill.    Imaging/Diagnostics   MRI was performed on the left knee at the [facility] on 10/26/2021 showed a diminutive medial meniscus with partial-thickness tearing.  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  There is an anterior lateral compartment multilobulated cyst.    No results found for: WBC, HGB, HCT, MCV, PLT  Lab Results   Component Value Date    K 4.5 01/03/2022       Assessment/Plan    Active Problems:    Tear of medial meniscus of knee joint, left    Primary osteoarthritis left knee    Plan: Dr. Noralyn Pick had a lengthy conversation with the pt regarding his knee pain and correlated it with his imaging and physical exam findings.  His symptoms are most consistent with left primary osteoarthritis (M17.00) affecting the medial compartment, affecting the patellofemoral compartment, medial meniscus tear (S83.249A), and a multilobulated anterolateral compartment cyst , which Dr. Noralyn Pick reviewed in detail.  Dr. Noralyn Pick discussed conservative treatment measures and surgical intervention.      Dr. Noralyn Pick discussed the surgery including left knee arthroscopy with partial medial meniscectomy, chondroplasty (54008), PRP injection (0232T), and cyst debridement  as well as expected recovery time with the patient.  Dr. Noralyn Pick also  discussed the risks as well as benefits of the  surgery which include but are not limited to the risk of infection, nerve or vascular injury resulting in loss of function loss of sensation, continued pain, need for future revision surgery as well as the attendant risks of anesthesia. The patient understands this and wishes to proceed.    Electronically signed by Rochel Brome, PA on 01/06/22 at 5:31 PM EDT

## 2022-01-07 ENCOUNTER — Inpatient Hospital Stay: Payer: MEDICARE | Attending: Orthopaedic Surgery

## 2022-01-07 ENCOUNTER — Inpatient Hospital Stay: Admit: 2022-01-07 | Attending: Orthopaedic Surgery

## 2022-01-07 MED ORDER — PROPOFOL 200 MG/20ML IV EMUL
200 MG/20ML | INTRAVENOUS | Status: AC
Start: 2022-01-07 — End: ?

## 2022-01-07 MED ORDER — DEXAMETHASONE SODIUM PHOSPHATE 4 MG/ML IJ SOLN
4 MG/ML | INTRAMUSCULAR | Status: AC
Start: 2022-01-07 — End: ?

## 2022-01-07 MED ORDER — DEXAMETHASONE SODIUM PHOSPHATE 4 MG/ML IJ SOLN
4 MG/ML | INTRAMUSCULAR | Status: DC | PRN
Start: 2022-01-07 — End: 2022-01-07
  Administered 2022-01-07: 16:00:00 4 via INTRAVENOUS

## 2022-01-07 MED ORDER — OXYCODONE-ACETAMINOPHEN 10-325 MG PO TABS
10-325 MG | Freq: Once | ORAL | Status: AC
Start: 2022-01-07 — End: 2022-01-07
  Administered 2022-01-07: 19:00:00 1 via ORAL

## 2022-01-07 MED ORDER — LIDOCAINE HCL 2 % IJ SOLN
2 % | INTRAMUSCULAR | Status: DC | PRN
Start: 2022-01-07 — End: 2022-01-07
  Administered 2022-01-07: 16:00:00 60 via INTRAVENOUS

## 2022-01-07 MED ORDER — BUPIVACAINE-EPINEPHRINE 0.25% -1:200000 IJ SOLN
INTRAMUSCULAR | Status: DC | PRN
Start: 2022-01-07 — End: 2022-01-07
  Administered 2022-01-07: 17:00:00 5 via INTRADERMAL

## 2022-01-07 MED ORDER — LIDOCAINE HCL 2 % IJ SOLN
2 % | INTRAMUSCULAR | Status: AC
Start: 2022-01-07 — End: ?

## 2022-01-07 MED ORDER — ACETAMINOPHEN 500 MG PO TABS
500 MG | Freq: Once | ORAL | Status: AC
Start: 2022-01-07 — End: 2022-01-07
  Administered 2022-01-07: 14:00:00 1000 mg via ORAL

## 2022-01-07 MED ORDER — NORMAL SALINE FLUSH 0.9 % IV SOLN
0.9 % | Freq: Two times a day (BID) | INTRAVENOUS | Status: DC
Start: 2022-01-07 — End: 2022-01-07

## 2022-01-07 MED ORDER — SODIUM CHLORIDE 0.9 % IV SOLN
0.9 % | INTRAVENOUS | Status: DC | PRN
Start: 2022-01-07 — End: 2022-01-07

## 2022-01-07 MED ORDER — FENTANYL CITRATE (PF) 100 MCG/2ML IJ SOLN
100 MCG/2ML | INTRAMUSCULAR | Status: DC | PRN
Start: 2022-01-07 — End: 2022-01-07
  Administered 2022-01-07: 16:00:00 100 via INTRAVENOUS

## 2022-01-07 MED ORDER — NORMAL SALINE FLUSH 0.9 % IV SOLN
0.9 % | INTRAVENOUS | Status: DC | PRN
Start: 2022-01-07 — End: 2022-01-07

## 2022-01-07 MED ORDER — ONDANSETRON HCL 4 MG PO TABS
4 MG | ORAL_TABLET | Freq: Three times a day (TID) | ORAL | 0 refills | Status: DC | PRN
Start: 2022-01-07 — End: 2022-09-11

## 2022-01-07 MED ORDER — FENTANYL CITRATE (PF) 100 MCG/2ML IJ SOLN
100 MCG/2ML | INTRAMUSCULAR | Status: AC
Start: 2022-01-07 — End: ?

## 2022-01-07 MED ORDER — LACTATED RINGERS IV SOLN
INTRAVENOUS | Status: DC
Start: 2022-01-07 — End: 2022-01-07
  Administered 2022-01-07: 14:00:00 via INTRAVENOUS

## 2022-01-07 MED ORDER — PROPOFOL 200 MG/20ML IV EMUL
200 MG/20ML | INTRAVENOUS | Status: DC | PRN
Start: 2022-01-07 — End: 2022-01-07
  Administered 2022-01-07: 16:00:00 130 via INTRAVENOUS

## 2022-01-07 MED ORDER — MIDAZOLAM HCL 2 MG/2ML IJ SOLN
2 MG/ML | INTRAMUSCULAR | Status: AC
Start: 2022-01-07 — End: ?

## 2022-01-07 MED ORDER — EPINEPHRINE 1 MG/ML IJ SOLN (MIXTURES ONLY)
Status: DC | PRN
Start: 2022-01-07 — End: 2022-01-07
  Administered 2022-01-07: 17:00:00 3000

## 2022-01-07 MED ORDER — HYDROMORPHONE HCL 2 MG/ML IJ SOLN
2 MG/ML | INTRAMUSCULAR | Status: DC | PRN
Start: 2022-01-07 — End: 2022-01-07
  Administered 2022-01-07 (×2): 0.25 mg via INTRAVENOUS

## 2022-01-07 MED ORDER — CEFAZOLIN SODIUM 2 G SOLR (MIXTURES ONLY)
2 g | INTRAVENOUS | Status: AC
Start: 2022-01-07 — End: 2022-01-07
  Administered 2022-01-07: 16:00:00 2000 mg via INTRAVENOUS

## 2022-01-07 MED ORDER — ONDANSETRON HCL 4 MG/2ML IJ SOLN
4 MG/2ML | Freq: Once | INTRAMUSCULAR | Status: AC | PRN
Start: 2022-01-07 — End: 2022-01-07
  Administered 2022-01-07: 18:00:00 4 mg via INTRAVENOUS

## 2022-01-07 MED ORDER — ONDANSETRON HCL 4 MG/2ML IJ SOLN
4 MG/2ML | INTRAMUSCULAR | Status: AC
Start: 2022-01-07 — End: ?

## 2022-01-07 MED ORDER — TRANEXAMIC ACID 1000 MG/10ML IV SOLN
1000 MG/10ML | Freq: Once | INTRAVENOUS | Status: AC
Start: 2022-01-07 — End: 2022-01-07
  Administered 2022-01-07: 16:00:00 2100 mg via INTRAVENOUS

## 2022-01-07 MED ORDER — MIDAZOLAM HCL 2 MG/2ML IJ SOLN
2 MG/ML | INTRAMUSCULAR | Status: DC | PRN
Start: 2022-01-07 — End: 2022-01-07
  Administered 2022-01-07: 16:00:00 2 via INTRAVENOUS

## 2022-01-07 MED ORDER — BUPIVACAINE-EPINEPHRINE (PF) 0.25% -1:200000 IJ SOLN
INTRAMUSCULAR | Status: AC
Start: 2022-01-07 — End: ?

## 2022-01-07 MED ORDER — ONDANSETRON HCL 4 MG/2ML IJ SOLN
4 MG/2ML | INTRAMUSCULAR | Status: DC | PRN
Start: 2022-01-07 — End: 2022-01-07
  Administered 2022-01-07: 16:00:00 4 via INTRAVENOUS

## 2022-01-07 MED FILL — ACETAMINOPHEN EXTRA STRENGTH 500 MG PO TABS: 500 MG | ORAL | Qty: 2

## 2022-01-07 MED FILL — XYLOCAINE 2 % IJ SOLN: 2 % | INTRAMUSCULAR | Qty: 20

## 2022-01-07 MED FILL — MIDAZOLAM HCL 2 MG/2ML IJ SOLN: 2 MG/ML | INTRAMUSCULAR | Qty: 2

## 2022-01-07 MED FILL — OXYCODONE-ACETAMINOPHEN 10-325 MG PO TABS: 10-325 MG | ORAL | Qty: 1

## 2022-01-07 MED FILL — DEXAMETHASONE SODIUM PHOSPHATE 4 MG/ML IJ SOLN: 4 MG/ML | INTRAMUSCULAR | Qty: 1

## 2022-01-07 MED FILL — CEFAZOLIN SODIUM 2 G IJ SOLR: 2 g | INTRAMUSCULAR | Qty: 2000

## 2022-01-07 MED FILL — SENSORCAINE-MPF/EPINEPHRINE 0.25% -1:200000 IJ SOLN: INTRAMUSCULAR | Qty: 20

## 2022-01-07 MED FILL — HYDROMORPHONE HCL 2 MG/ML IJ SOLN: 2 MG/ML | INTRAMUSCULAR | Qty: 1

## 2022-01-07 MED FILL — ONDANSETRON HCL 4 MG/2ML IJ SOLN: 4 MG/2ML | INTRAMUSCULAR | Qty: 2

## 2022-01-07 MED FILL — TRANEXAMIC ACID 1000 MG/10ML IV SOLN: 1000 MG/10ML | INTRAVENOUS | Qty: 21

## 2022-01-07 MED FILL — FENTANYL CITRATE (PF) 100 MCG/2ML IJ SOLN: 100 MCG/2ML | INTRAMUSCULAR | Qty: 2

## 2022-01-07 MED FILL — DIPRIVAN 200 MG/20ML IV EMUL: 200 MG/20ML | INTRAVENOUS | Qty: 20

## 2022-01-07 NOTE — Interval H&P Note (Signed)
Update History & Physical    Dr. Noralyn Pick reviewed the patient's History and Physical from 01/06/22 and reexamed the patient today, 01/07/2022. There was no change to the plan. The surgical site was confirmed by the patient and Dr. Noralyn Pick.     Plan: Dr. Noralyn Pick discussed the risks, benefits, expected outcome, and alternative to the recommended procedure with the patient. The patient understands and wants to proceed with the procedure.     Electronically signed by Rochel Brome, PA on 01/07/2022 at 11:31 AM

## 2022-01-07 NOTE — Anesthesia Procedure Notes (Signed)
Airway  Date/Time: 01/07/2022 12:13 PM  Urgency: elective    Airway not difficult    General Information and Staff    Patient location during procedure: OR  Anesthesiologist: Roland Earl, MD  Resident/CRNA: Carolyn Stare, CAA  Performed: resident/CRNA     Indications and Patient Condition  Indications for airway management: anesthesia  Spontaneous Ventilation: absent  Sedation level: deep  Preoxygenated: yes  Patient position: sniffing  MILS not maintained throughout  Mask difficulty assessment: not attempted    Final Airway Details  Final airway type: supraglottic airway      Successful airway: oropharyngeal  Size 5     Number of attempts at approach: 1  Ventilation between attempts: spontaneous    Additional Comments  ASA monitors applied. Patient preoxygenated. IV induction. Eyes taped. Easy mask ventilation. LMA placed easily, atraumatic. Positive ETCO2 detected, ETT secured. Patient positioned, head & neck supported. All pressure points padded.    no

## 2022-01-07 NOTE — Discharge Instructions (Signed)
POST-OPERATIVE HOME INSTRUCTIONS  KNEE ARTHROSCOPY  Dominic J. Carroll, MD - [facility]      Weight Bearing Status   WEIGHT BEARING AS TOLERATED immediately following surgery   Crutches as directed prior to leaving the hospital.  Most patients use crutches 3 to 5 days. The amount of pain you experience should be your guide for discontinuing crutch use.    Pain Control   Pain Medication: Norco as needed.  o Do not take Tylenol with Norco  o Do not drink alcohol or drive or use heavy machinery while taking Norco  **Do not take Aspirin or NSAIDs for 2 weeks since you received a PRP injection   Additional Pain Control  o Trimed ice machine: If you have arranged for a Trimed machine, you may use it as much or as little as you would like.  Patients find great relief from it.  If you develop a rash, consider discontinuing the Trimed machine as it could be a cold rash.  o Elevate the operative extremity    Diet  -Advance diet as tolerated    Other Post Operative Instructions   Alarming signs and symptoms to report: Call the office immediately for guidance [phone].  o Fever greater than 101.5F or 38C   o Redness and warmth around the incision  o Pus draining from the incision  o Red streaking up the leg  o Inability to urinate 8 hours after surgery, which could be related to the anesthetic and medications   Activity: Weightbearing as tolerated on the operative extremity.  o Range of motion exercises should be performed 3-5 times daily.  Be cautious not to overexert your knee.  Call your physical therapist if you have any questions about the appropriate exercises to be performing.  o You may advance your activity level as tolerated.   Dressings: Keep dressings clean, dry, and intact for a total of 3 days.   o After 3 days, you may then remove the dressing and run water over the incisions in the shower.  o Do not submerge the incisions.  o Do not scrub incisions.  You may gently cleanse them with soap  and water.  o Do not apply ointments to the incisions.  o Apply Band-Aids afterward showering and change daily.  Post-Operative Appointments and Timeline for Overall Recovery   Immediately following surgery: You may experience knee swelling, tightness, and pain which is normal for up to 6 weeks.     1st post-operative appointment: 10-14 days after surgery    Contact Information  If you have any problems or questions please call our office at [phone] during office hours 8:00a - 5:00p Monday - Friday. If you have a problem after hours that cannot wait to be addressed during office hours, call [phone] and the answering service will contact the on-call physician assistant.  Please know that the on-call provider will not refill medication.  Also be mindful that the on-call provider is working a day schedule and likely sleeping during the night.  You may call them if you need to, but if it is not urgent, please wait until daytime hours.

## 2022-01-07 NOTE — Anesthesia Post-Procedure Evaluation (Signed)
Department of Anesthesiology  Postprocedure Note    Patient: Dominic RIGGENBACH Sr.  MRN: [id number]  Birthdate: Mar 07, 1956  Date of evaluation: 01/07/2022      Procedure Summary     Date: 01/07/22 Room / Location: [facility] OR 02 / [facility]    Anesthesia Start: 1208 Anesthesia Stop: 1327    Procedure: LEFT KNEE ARTHROSCOPY WITH PARTIAL MEDIAL MENISECTOMY, CHONDROPLASTY, (PRP) PLATELET RICH PLASMA INJECTION (Left) Diagnosis:       Tear of medial meniscus of knee joint      Chondromalacia of patella      (Tear of medial meniscus of knee joint [S83.249A])      (Chondromalacia of patella [M22.40])    Surgeons: Roxanne Mins, MD Responsible Provider: Roland Earl, MD    Anesthesia Type: general ASA Status: 2          Anesthesia Type: No value filed.    Aldrete Phase I: Aldrete Score: 9    Aldrete Phase II: Aldrete Score: 10      Anesthesia Post Evaluation    Patient location during evaluation: PACU  Level of consciousness: sleepy but conscious  Airway patency: patent  Nausea & Vomiting: no nausea and no vomiting  Complications: no  Cardiovascular status: hemodynamically stable  Respiratory status: acceptable, nonlabored ventilation and spontaneous ventilation  Hydration status: euvolemic  Comments: Vital signs in PACU reviewed by anesthesia provider and documented in Handoff.  Multimodal analgesia pain management approach

## 2022-01-07 NOTE — Op Note (Addendum)
Operative Note      Patient: Dominic LAVE Sr.  Date of Birth: [birth year]  MRN: [id number]    Date of Procedure: 01/07/2022    Indication for Surgery:    The patient is a 66 y.o. year-old male who presented as a consult from the [place] for complaint of bilateral knee pain left greater than right.  Due to the acute nature of his left knee pain we will concentrate on the left knee.  The patient states that on 09/12/2021 he fell out of the tree and he landed on his left knee and he felt it below and crack resulting in severe pain in the knee.  He localizes the pain to the anterior medial aspect of the knee.  Sometimes it will radiate up to the hip.  He describes it as a shooting pain.  It is worse with standing from a seated position, bending and standing or going up stairs.  He has no associated swelling.  He denies radiation, numbness or tingling.  He presented to the [place] and an MRI was obtained which revealed that the patient has a diminutive medial meniscus with partial-thickness tearing.  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  There is an anterior lateral compartment multilobulated cyst.  He was treated conservatively with activity modification, ice, naproxen and oxycodone.  He states that he has tried physical therapy in the past which has not been helpful.  He notes that he underwent an arthroscopic surgery 15 to 20 years ago with a good result.  He now presents today for further evaluation and treatment. I had a very long discussion with the patient where I reviewed his MRI findings and correlated them with his physical exam findings.  I explained that fortunately he did not have any fractures or ligamentous tears or injuries.  However, he has a small medial meniscus tear, chondromalacia especially affecting the medial patellofemoral compartments as well as a multilobulated cyst likely contributing to his pain and symptoms.  I discussed continued conservative treatment  with anti-inflammatories, physical therapy, and injections versus arthroscopic intervention consisting of a partial medial meniscectomy, chondroplasty, cyst debridement, PRP injection and associated procedures.  I discussed the surgery as well as expected recovery time.  Also discussed risks as well as benefits of the surgery which include but not limited to the risk of infection, neurovascular injury resulting in loss of function or loss of sensation, continued pain, need for future revision surgery as well as the attendant risks of anesthesia.  After discussion, the patient states that he has not improved with conservative treatment and would like to proceed with arthroscopic intervention.    Preoperative Diagnosis:    1. Left knee medial meniscus tear  2. Left knee chondromalacia  3. Left knee intra-articular anterolateral multiloculated cyst    Postoperative Diagnosis:    1. Left knee medial meniscus tear  2. Left knee medial compartment chondromalacia consistent with Outerbridge grade 3 and almost grade 4 changes  3. Left knee patellofemoral chondromalacia consistent with Outerbridge grade 3 changes  4. Left knee lateral meniscus tearing and fraying  5. Left knee lateral tibial plateau chondromalacia consistent with Outerbridge grade 3-4 changes  6. Left knee intra-articular anterolateral multiloculated cyst    Operation:    1. Left knee arthroscopic partial medial meniscectomy  2. Left knee arthroscopic medial compartment chondroplasty and abrasion arthroplasty in the full-thickness areas  3. Left knee arthroscopic patellofemoral chondroplasty   4. Left knee arthroscopic partial lateral meniscectomy  5. Left knee arthroscopic lateral compartment chondroplasty  6. Left knee arthroscopic intra-articular multiloculated cyst debridement  7. Left knee protein rich plasma intra-articular injection    A modifier 22 was appended to the surgery due to the additional 15 minutes of operative time, skill and expertise to  debride the multiloculated intra-articular cyst of the anterolateral knee.    Surgeon(s):    Roxanne Mins M.D.    Assistant:    Raynelle Dick PA-C-the physician assistant was required for preoperative positioning, intraoperative help with retraction and instrumentation and postoperative closure.    Anesthesia:    Gen. endotracheal    Estimated Blood Loss:    20 mL    Urine Output:    None    Findings:    Above    Specimen(s):    None    Complications:    None    Technique:    The patient was taken to the operating room and placed on the table in supine position.  He was given 2 g of Ancef prior to the surgery for antibiotic prophylaxis. A timeout was performed confirming the left lower extremity was the correct extremity. After successful induction of general anesthesia, the left lower extremity was prepped and draped in standard sterile fashion. A tourniquet was placed high on the thigh and inflated to 250 mmHg.    Two standard arthroscopic portals were established with one inferior medial and one inferior lateral. The arthroscope was inserted through the inferolateral portal into the suprapatellar pouch. Inspection was begun revealing significant synovitis throughout the suprapatellar pouch and medial and lateral gutters. An arthroscopic shaver was inserted and a synovectomy was performed.    Attention was then turned to the patellofemoral joint where inspection and probing of the articular surface of the patella as well as femoral trochlea revealed diffuse degenerative changes consistent with Outerbridge grade 3 changes. An arthroscopic shaver was inserted and a chondroplasty was performed.     Attention was then turned to the notch where inspection and probing of the ACL and PCL revealed a were intact.      Attention was then turned to the anterior lateral aspect of the knee where there is noted to be a multiloculated intra-articular cyst.  An arthroscopic resector was inserted and the cyst was debrided and  removed.    Attention was then turned to the medial compartment where inspection and probing of the articular surface of the medial femoral condyle and medial tibial plateau revealed significant cartilage degeneration of the weightbearing area of the medial femoral condyle and medial tibial plateau consistent with Outerbridge grade 3 and almost grade 4 changes. An arthroscopic shaver was inserted and an abrasion arthroplasty was performed in the full-thickness areas and a chondroplasty in the remaining areas.     Attention was then turned to the medial meniscus where there was noted be a tear of the body and posterior horn of the medial meniscus. An arthroscopic biter followed by an arthroscopic shaver were repeatedly inserted until this was trimmed back to a smooth stable edge.     Attention was then turned to the lateral compartment the knee where inspection and probing of the articular surface of the lateral tibial plateau revealed cartilage degeneration consistent with Outerbridge grade 3-4 changes. An arthroscopic shaver was inserted and a chondroplasty was performed.    Attention was then turned to the lateral meniscus where inspection and probing revealed fraying and tearing of the central edge. An arthroscopic biter followed by an arthroscopic  shaver were repeatedly inserted until this was trimmed back to a smooth stable edge.     The knee was then copiously irrigated and evacuated. The arthroscopic instruments were removed. A solution of 6 cc of protein rich plasma obtained using the Arthrex protocol was then injected into the intra-articular space.    The incisions were then closed in a simple fashion with 3-0 nylon. A clean dry sterile dressing was applied followed by an Ace bandage followed by cold device. The patient was awoken from general anesthesia and transferred to the recovery room in stable condition.    Tourniquet time: 40 minutes at 250 mmHg    Activity: Weightbearing as tolerated  postoperatively.    Dressing care: Keep your dressing clean, dry and intact for total of 3 days. You may then remove the dressing and shower. Apply Band-Aids afterwards. Do not submerge the incisions.    Follow-up: Dr. Noralyn Pickarroll in 2 weeks.    Electronically signed by Roxanne MinsWilliam J Sollie Vultaggio, MD on 01/07/2022 at 1:13 PM

## 2022-01-07 NOTE — Telephone Encounter (Signed)
Communicated with [facility] - order will be placed tomorrow and they will be in contact with the patient.

## 2022-01-07 NOTE — Anesthesia Pre-Procedure Evaluation (Addendum)
Department of Anesthesiology  Preprocedure Note       Name:  Dominic W Nall Sr.   Age:  66 y.o.  DOB:  [birth year]                                          MRN:  [id number]         Date:  01/07/2022      Surgeon: Moishe SpiceSurgeon(s):  Roxanne MinsWilliam J Carroll, MD    Procedure: Procedure(s):  LEFT KNEE ARTHROSCOPY WITH PARTIAL MEDIAL MENISECTOMY, CHONDROPLASTY, (PRP) PLATELET RICH PLASMA INJECTION    Medications prior to admission:   Prior to Admission medications    Medication Sig Start Date End Date Taking? Authorizing Provider   esomeprazole Magnesium (NEXIUM) 20 MG PACK Take 1 packet by mouth nightly as needed (INDIGESTION)   Yes Historical Provider, MD   Omega-3 Fatty Acids (FISH OIL) 1000 MG capsule Take 1 capsule by mouth daily   Yes Historical Provider, MD   hydroCHLOROthiazide (HYDRODIURIL) 25 MG tablet Take 1 tablet by mouth daily    Historical Provider, MD   GNP PAIN RELIEF EX-STRENGTH 500 MG tablet Take 1 tablet by mouth every 8 hours as needed for Pain 02/07/21   Historical Provider, MD   Cyanocobalamin (VITAMIN B 12) 500 MCG TABS Take 1 tablet by mouth every morning 02/26/21   Historical Provider, MD   triamcinolone (KENALOG) 0.1 % cream 0.1 g 3 times daily 02/06/21   Historical Provider, MD   meloxicam (MOBIC) 15 MG tablet Take 1 tablet by mouth nightly as needed for Pain 12/07/20 02/05/21  [facility] Automatic Reconciliation, MD   oxyCODONE-acetaminophen (PERCOCET) 10-325 MG per tablet Take 1 tablet by mouth every 8 hours as needed for Pain.    [facility] Automatic Reconciliation, MD       Current medications:    No current facility-administered medications for this encounter.     Current Outpatient Medications   Medication Sig Dispense Refill   . esomeprazole Magnesium (NEXIUM) 20 MG PACK Take 1 packet by mouth nightly as needed (INDIGESTION)     . Omega-3 Fatty Acids (FISH OIL) 1000 MG capsule Take 1 capsule by mouth daily     . hydroCHLOROthiazide (HYDRODIURIL) 25 MG tablet Take 1 tablet by mouth daily     . GNP PAIN RELIEF  EX-STRENGTH 500 MG tablet Take 1 tablet by mouth every 8 hours as needed for Pain     . Cyanocobalamin (VITAMIN B 12) 500 MCG TABS Take 1 tablet by mouth every morning     . triamcinolone (KENALOG) 0.1 % cream 0.1 g 3 times daily     . meloxicam (MOBIC) 15 MG tablet Take 1 tablet by mouth nightly as needed for Pain     . oxyCODONE-acetaminophen (PERCOCET) 10-325 MG per tablet Take 1 tablet by mouth every 8 hours as needed for Pain.         Allergies:    Allergies   Allergen Reactions   . Gabapentin Anxiety and Hallucinations   . Prochlorperazine Anxiety and Other (See Comments)     Event:    Dizziness  REACTION: "Flighty"  compazine         Problem List:    Patient Active Problem List   Diagnosis Code   . Chronic narcotic use F11.90   . Full thickness tear of right subscapularis tendon E95.284XS46.811A   . Knee pain  M25.569   . Nontraumatic incomplete tear of right rotator cuff M75.111   . Pain in right shoulder M25.511   . Tear of medial meniscus of knee joint S83.249A   . Chondromalacia of patella M22.40       Past Medical History:        Diagnosis Date   . Acid reflux    . Anxiety    . Claustrophobia    . Depression    . HBP (high blood pressure)    . High cholesterol    . Shoulder pain    . Snorings    . Tear meniscus knee    . Wears glasses        Past Surgical History:        Procedure Laterality Date   . CERVICAL SPINE SURGERY      x2   . COLONOSCOPY     . KNEE ARTHROSCOPY Right     x2   . LUMBAR SPINE SURGERY      x3   . SHOULDER ARTHROSCOPY Right 07/07/2019       Social History:    Social History     Tobacco Use   . Smoking status: Former     Types: Cigarettes     Quit date: 2000     Years since quitting: 23.4   . Smokeless tobacco: Never   Substance Use Topics   . Alcohol use: Yes     Alcohol/week: 1.0 standard drink     Types: 1 Glasses of wine per week                                Counseling given: Not Answered      Vital Signs (Current):   Vitals:    01/01/22 0837   Weight: 225 lb (102.1 kg)   Height: 6'  (1.829 m)                                              BP Readings from Last 3 Encounters:   No data found for BP       NPO Status:                                                                                 BMI:   Wt Readings from Last 3 Encounters:   01/01/22 225 lb (102.1 kg)   11/11/21 230 lb (104.3 kg)   08/21/21 225 lb (102.1 kg)     Body mass index is 30.52 kg/m.    CBC: No results found for: WBC, RBC, HGB, HCT, MCV, RDW, PLT    CMP:   Lab Results   Component Value Date/Time    K 4.5 01/03/2022 12:40 PM       POC Tests: No results for input(s): POCGLU, POCNA, POCK, POCCL, POCBUN, POCHEMO, POCHCT in the last 72 hours.    Coags: No results found for: PROTIME, INR, APTT    HCG (If Applicable): No results found for: PREGTESTUR, PREGSERUM, HCG, HCGQUANT  ABGs: No results found for: PHART, PO2ART, PCO2ART, HCO3ART, BEART, O2SATART     Type & Screen (If Applicable):  No results found for: LABABO, LABRH    Drug/Infectious Status (If Applicable):  No results found for: HIV, HEPCAB    COVID-19 Screening (If Applicable): No results found for: COVID19        Anesthesia Evaluation  Patient summary reviewed no history of anesthetic complications:   Airway: Mallampati: III       Mouth opening: > = 3 FB   Dental: normal exam         Pulmonary:Negative Pulmonary ROS                              Cardiovascular:    (+) hypertension:,                   Neuro/Psych:               GI/Hepatic/Renal:   (+) GERD:,           Endo/Other:                     Abdominal:             Vascular:          Other Findings:           Anesthesia Plan      general     ASA 2       Induction: intravenous.    MIPS: Postoperative opioids intended.  Anesthetic plan and risks discussed with patient.      Plan discussed with CRNA and attending.                    Carolyn Stare, CAA   01/07/2022

## 2022-01-07 NOTE — Telephone Encounter (Signed)
Spouse is requesting a new order for an ice machine assist in patients recovery.

## 2022-01-08 NOTE — Telephone Encounter (Signed)
INFORMATION SENT TO [facility] TODAY - THEY WILL BE CONTACTED TODAY

## 2022-01-17 ENCOUNTER — Ambulatory Visit: Admit: 2022-01-17 | Discharge: 2022-01-17 | Payer: MEDICARE

## 2022-01-17 ENCOUNTER — Ambulatory Visit: Admit: 2022-01-17 | Discharge: 2022-01-17 | Payer: MEDICARE | Attending: Orthopaedic Surgery

## 2022-01-17 DIAGNOSIS — M25552 Pain in left hip: Secondary | ICD-10-CM

## 2022-01-17 MED ORDER — METHYLPREDNISOLONE ACETATE 40 MG/ML IJ SUSP
40 MG/ML | Freq: Once | INTRAMUSCULAR | Status: AC
Start: 2022-01-17 — End: 2022-01-17
  Administered 2022-01-17: 16:00:00 40 mg via INTRA_ARTICULAR

## 2022-01-17 NOTE — Progress Notes (Signed)
Dominic Knight. is a 66 y.o. male  presenting for an initial F/U S/P LT Knee Arthroscopy. Patient reports that the knee is not doing well. Patient reports that the knee hurts worse now than before and has begun to bother his hip as well. Patient has not yet begun PT. Patient reports that the knee is very weak and painful with limited ROM. Pt reports that the LT hip pain is very painful. Patient is unable to find a comfortable position for both hip and knee. Patient reports that the knee gives out intermittently. Patient reports that heat and walking seem to help the hip and knee. Ice seems to help the knee along with the Rx medication.

## 2022-01-17 NOTE — Progress Notes (Signed)
[facility]  [facility]     PROVIDER:  Roxanne Mins, MD   Date of Encounter:  01/17/2022     PATIENT INFORMATION:   Name: Dominic Knight.  Age:  66 y.o.  Sex:  male  DOB:  1956/04/20     MRN:  [id number]  Cell Phone:    Telephone Information:   Mobile [phone]        Address:  [address]  [place] [address]   PCP:  Jessica [place] Hund, MD  Insurance:  Payor: MEDICARE / Plan: MEDICARE PART A AND B / Product Type: *No Product type* /         CHIEF COMPLAINT:       Chief Complaint   Patient presents with    Follow-up     F/U 10 DAY S/P LT KNEE SCOPE  DOS: 01/07/2022        History of Present Illness:    The patient is a 66 y.o. 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  He returns today stating that his knee is swollen and fairly painful.    However, his main complaint today is extremely severe left buttock pain.  He states that it is so painful that he cannot concentrate and was almost crying in the car on the way over to his appointment.  He can barely sit on his buttocks and limps when he tries to stand to walk.  He describes it as a very sharp burning pain localized to the superior left buttock.  It also feels like it radiates around into the left groin area.  He also has associated radiating numbness and tingling down the left lower extremity which he describes as chronic sciatica that he has had for a long time.  He has undergone 3 lumbar spine surgeries in the past with fusions and hardware.  He takes Percocet 10/325 mg on a  chronic basis for chronic neck pain.  He does not describe weakness.  He now presents today for further evaluation and treatment.    Past history not for coding or billing purposes:    The patient initially presented as a new patient consult from the [place] for complaint of bilateral knee pain left greater than right.  Due to the acute nature of his left knee pain we will concentrate on this today.  The patient states that on 09/12/2021 he fell out of the tree and he landed on his left knee and he felt it below and crack resulting in severe pain in the knee.  He localizes the pain to the anterior medial aspect of the knee.  Sometimes it will radiate up to the hip.  He describes it as a shooting pain.  It is worse with standing from a seated position, bending and standing or going up stairs.  He has no associated swelling.  He denies radiation, numbness or tingling.  He presented to the [place] and an MRI was obtained which revealed that the patient has a diminutive medial meniscus with partial-thickness tearing.  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  There is an anterior lateral compartment multilobulated  cyst.  He was treated conservatively with activity modification, ice, naproxen and oxycodone.  He states that he has tried physical therapy in the past which has not been helpful.  He notes that he underwent an arthroscopic surgery 15 to 20 years ago with a good result.  He now presents today for further evaluation and treatment.    Of note, he also underwent an arthroscopic surgery for his contralateral right knee on 06/07/2020.        PHYSICAL EXAMINATION:     General: Patient is alert and oriented to person, place and time. Well-developed, well-nourished, well groomed, in no acute distress. Normal mood and affect, cooperative, appropriate attitude.     Cardiovascular: Regular rhythm by palpation of distal pulse, normal color and temperature, no pitting edema of  uninvolved LE     Pulmonary: breathing comfortably, normal and symmetric chest expansion, no intercostal retractions     Musculoskeletal:    Examination of the left knee reveals that skin is intact.  His incisions are healing excellently without signs of infection.  There is no erythema or drainage.  There is swelling and a large effusion.  Range of motion is from 0 to 90 degrees limited by pain.  He has no calf tenderness.  He has 5 out of 5 strength.  Sensation is intact.  He has less than 2-second capillary refill.    Examination of his left hip reveals that the skin is intact.  There is no significant swelling.  Range of motion reveals forward flexion 100 degrees, external rotation is 60 degrees and internal rotation to 10 degrees associated with pain.  He has mild tenderness palpation of the greater trochanteric bursa.  He states that there is deep tenderness in the superior left buttock area.  He has a slightly positive straight leg raise test.  He has 5 out of 5 strength distally.  Sensation is grossly intact.  He has less than 2-second capillary refill.    IMAGING:     01/17/2022: 2 views of the left hip were taken today at the [facility] office.  I performed an independent interpretation which reveals no significant left hip joint space narrowing or evidence for other abnormalities.    Previous imaging studies:    An MRI was performed on the left knee at the [facility] on 10/26/2021.  I reviewed the report.  Also performed an independent interpretation of the images which reveals that the patient has a diminutive medial meniscus with partial-thickness tearing.  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  There is an anterior lateral compartment multilobulated cyst.    ASSESSMENT(S):     Diagnosis Orders   1. Left hip pain  XR HIP LEFT (2-3 VIEWS) (CLINIC PERFORMED)    methylPREDNISolone acetate (DEPO-MEDROL) injection 40 mg      2. Sciatica, left side         3. Trochanteric bursitis of left hip  XR HIP LEFT (2-3 VIEWS) (CLINIC PERFORMED)    methylPREDNISolone acetate (DEPO-MEDROL) injection 40 mg      4. S/P left knee arthroscopy  PR ARTHROCENTESIS ASPIR&/INJ MAJOR JT/BURSA W/O [place]    LEFT KNEE ARTHROSCOPY WITH PARTIAL MEDIAL MENISECTOMY, CHONDROPLASTY, (PRP) PLATELET RICH PLASMA INJECTION - DR. Collene Mares - DOS: 01/07/22          5. Effusion, left knee            TREATMENT:     I had a long discussion with the patient  regarding his new urgent complaint of left hip and left buttock radiating pain spending a total time of 40 minutes which included 5 minutes to prepare and review the patient's chart, 25 minutes face-to-face time discussing the patient's complaints, and 10 minutes to place orders, referrals, and charting.  I reviewed his x-ray and correlated it with his physical exam findings.  I explained that there was no evidence for significant osteoarthritis of the left hip or other abnormality.  Therefore, I discussed that his pain was less likely coming from the hip joint.  I discussed that he did have some tenderness around the greater trochanteric bursa.  He may have a component of greater trochanteric bursitis.  However I did not feel that this was his main complaint.  The patient wished to undergo treatment for the greater trochanteric bursitis to try to alleviate any of his pain.  Therefore, we discussed physical therapy exercises on stretching as well as a cortisone injection into the area.  The patient wished to proceed.  After local sterile prep I injected 1 cc of Depo-Medrol 40 mg and 2 cc of quarter percent Marcaine into the left hip greater trochanteric bursa area.    I also explained that the majority of his pain is likely coming from his low back.  He has a history of 3 previous surgeries.  Therefore, I instructed him to follow-up with his spine surgeon.  In the interim, I gave him a prescription for a Medrol Dosepak.    In regards to his left  knee status post left knee arthroscopy.  I explained that this is less likely a reason for him to have pain in the hip area.  However he does have a large effusion and I explained that this could cause pressure and pain within the knee like he is having.  Therefore, I recommended aspirating the knee.  The patient wished to proceed.  After local sterile prep I inserted an 18-gauge needle and aspirated 70 cc of dark red blood.  The patient tolerated the procedure well and had relief of the pressure.  I then applied an Ace bandage.  I instructed the patient to ice the knee.     Follow Up:     Follow-up with Dr. Noralyn Pickarroll in 1 to 2 weeks to ensure that he is gaining relief    Procedures:       DME:         Orders:     Orders Placed This Encounter   Procedures    XR HIP LEFT (2-3 VIEWS) (CLINIC PERFORMED)     Standing Status:   Future     Number of Occurrences:   1     Standing Expiration Date:   01/18/2023     Scheduling Instructions:      PATIENT IN ROOM 2    PR ARTHROCENTESIS ASPIR&/INJ MAJOR JT/BURSA W/O [place]        Rx:     Outpatient Encounter Medications as of 01/17/2022   Medication Sig Dispense Refill    cyclobenzaprine (FLEXERIL) 10 MG tablet Take 1 tablet by mouth 3 times daily as needed for Muscle spasms      ondansetron (ZOFRAN) 4 MG tablet Take 1 tablet by mouth every 8 hours as needed for Nausea or Vomiting 20 tablet 0    esomeprazole Magnesium (NEXIUM) 20 MG PACK Take 1 packet by mouth nightly as needed (INDIGESTION) (Patient not taking: Reported on 01/07/2022)      Omega-3 Fatty Acids (FISH OIL)  1000 MG capsule Take 1 capsule by mouth daily      hydroCHLOROthiazide (HYDRODIURIL) 25 MG tablet Take 1 tablet by mouth daily (Patient not taking: Reported on 01/07/2022)      GNP PAIN RELIEF EX-STRENGTH 500 MG tablet Take 1 tablet by mouth every 8 hours as needed for Pain      Cyanocobalamin (VITAMIN B 12) 500 MCG TABS Take 1 tablet by mouth every morning      triamcinolone (KENALOG) 0.1 % cream 0.1 g 3 times daily       oxyCODONE-acetaminophen (PERCOCET) 10-325 MG per tablet Take 1 tablet by mouth every 8 hours as needed for Pain.      [EXPIRED] methylPREDNISolone acetate (DEPO-MEDROL) injection 40 mg        No facility-administered encounter medications on file as of 01/17/2022.        PAST MEDICAL HISTORY:   Past Medical History:   Diagnosis Date    Acid reflux     Anxiety     Claustrophobia     Depression     HBP (high blood pressure)     High cholesterol     Shoulder pain     Snorings     Tear meniscus knee     Wears glasses         PAST SURGICAL HISTORY:   Past Surgical History:   Procedure Laterality Date    CERVICAL SPINE SURGERY      x2    COLONOSCOPY      KNEE ARTHROSCOPY Right     x2    KNEE ARTHROSCOPY Left 01/07/2022    LEFT KNEE ARTHROSCOPY WITH PARTIAL MEDIAL MENISECTOMY, CHONDROPLASTY, (PRP) PLATELET RICH PLASMA INJECTION performed by Roxanne Mins, MD at [facility] MAIN OR    LUMBAR SPINE SURGERY      x3    SHOULDER ARTHROSCOPY Right 07/07/2019         FAMILY HISTORY:   No family history on file.     SOCIAL HISTORY:   Social History     Occupational History    Not on file   Tobacco Use    Smoking status: Former     Types: Cigarettes     Quit date: 2000     Years since quitting: 23.4    Smokeless tobacco: Never   Vaping Use    Vaping Use: Never used   Substance and Sexual Activity    Alcohol use: Yes     Alcohol/week: 1.0 standard drink     Types: 1 Glasses of wine per week    Drug use: Never    Sexual activity: Not on file       MEDICATIONS:   Current Outpatient Medications   Medication Sig Dispense Refill    cyclobenzaprine (FLEXERIL) 10 MG tablet Take 1 tablet by mouth 3 times daily as needed for Muscle spasms      ondansetron (ZOFRAN) 4 MG tablet Take 1 tablet by mouth every 8 hours as needed for Nausea or Vomiting 20 tablet 0    esomeprazole Magnesium (NEXIUM) 20 MG PACK Take 1 packet by mouth nightly as needed (INDIGESTION) (Patient not taking: Reported on 01/07/2022)      Omega-3 Fatty Acids (FISH OIL) 1000 MG  capsule Take 1 capsule by mouth daily      hydroCHLOROthiazide (HYDRODIURIL) 25 MG tablet Take 1 tablet by mouth daily (Patient not taking: Reported on 01/07/2022)      GNP PAIN RELIEF EX-STRENGTH 500 MG tablet Take 1 tablet by  mouth every 8 hours as needed for Pain      Cyanocobalamin (VITAMIN B 12) 500 MCG TABS Take 1 tablet by mouth every morning      triamcinolone (KENALOG) 0.1 % cream 0.1 g 3 times daily      oxyCODONE-acetaminophen (PERCOCET) 10-325 MG per tablet Take 1 tablet by mouth every 8 hours as needed for Pain.       No current facility-administered medications for this visit.        ALLERGIES:   Allergies   Allergen Reactions    Gabapentin Anxiety and Hallucinations    Prochlorperazine Anxiety and Other (See Comments)     Event:    Dizziness  REACTION: "Flighty"  compazine         REVIEW OF SYSTEMS:     Review of Systems   Musculoskeletal: see HPI for orthopaedic medical issues     Notes: Patient is to continue all medications as directed by prescribing physicians. Continuations on today's visit are made based on the patient's report of current medications.      Current Meds Verified: Current meds/immunizations reviewed, including purpose with pt. Med Recon list given to pt/family. Pt advised to discard old med lists and provide all providers with current list at each visit and carry list with them in case of emergency     This document was created using voice recognition software so mistakes are possible. For any concerns about the wording of this document, please contact its creator for further clarification.

## 2022-01-18 MED ORDER — METHYLPREDNISOLONE 4 MG PO TBPK
4 MG | PACK | ORAL | 0 refills | Status: DC
Start: 2022-01-18 — End: 2022-09-11

## 2022-01-21 NOTE — Telephone Encounter (Signed)
Pain radiating up hip is not  getting better and need referral for hip   please call and assist  thank you

## 2022-01-21 NOTE — Telephone Encounter (Signed)
Patient is being seen by Dr. Noralyn Pick Friday, 01/24/22 for follow up evaluation after being seen 1 wk. Ago. Further evaluation and discussion of referral will be between patient and Dr. Noralyn Pick at next appointment.

## 2022-01-22 ENCOUNTER — Encounter

## 2022-01-23 NOTE — Telephone Encounter (Signed)
From: Corlis Hove Sr.  To: Dr. Roxanne Mins  Sent: 01/22/2022 10:33 AM EDT  Subject: physical threapy    Hello,  Do you feel it is time for physical therapy? If so, please prescribe.    Thanks Marina Goodell

## 2022-01-24 ENCOUNTER — Encounter: Attending: Orthopaedic Surgery

## 2022-09-09 NOTE — Progress Notes (Signed)
Formatting of this note is different from the original.  Images from the original note were not included.  Subjective:     Patient ID: Dominic Knight. is a 67 y.o. male who presents with chief complaint   Chief Complaint   Patient presents with    Blood Pressure Check     General/social: lives at home with wife who I see, [name]. He was in the [employer], [facility]. He has two girls and two boys; 63, 75, 71 and 68 yo. 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 He reports fatigue, orthostatic dizziness and generally unwell. Initially SBP 200s and down to 129 today. Reports consistent readings at home in the 120s. He is currently on Losartan 100 mg. Norvasc 10 mg and Chlorthalidone 25 mg.     DMII: stable with A1c 6.1%.      Diet/exercise: reports regular daily movement and walking    Family History   Problem Relation Age of Onset    Heart disease Father      Review of Systems   Constitutional:  Positive for malaise/fatigue. Negative for chills and fever.   HENT:  Negative for congestion, hearing loss, sinus pain and sore throat.    Eyes:  Negative for blurred vision and discharge.   Respiratory:  Negative for cough, shortness of breath and wheezing.    Cardiovascular:  Negative for chest pain, palpitations and orthopnea.   Gastrointestinal:  Negative for abdominal pain, constipation, diarrhea, nausea and vomiting.   Genitourinary:  Negative for dysuria, frequency, hematuria and urgency.   Musculoskeletal:  Negative for back pain, joint pain, myalgias and neck pain.   Skin:  Negative for rash.   Neurological:  Positive for dizziness. Negative for sensory change and headaches.   Endo/Heme/Allergies:  Does not bruise/bleed easily.   Psychiatric/Behavioral:  Negative for depression and memory loss. The patient is not nervous/anxious and does not have insomnia.      Past Medical History:   Diagnosis Date    Anxiety      Arthritis     BPH (benign prostatic hyperplasia)     Depression     Diabetes mellitus     type 2    Erectile dysfunction     Hyperlipidemia     Hypertension     PTSD (post-traumatic stress disorder)        Medication List          Accurate as of September 09, 2022  4:15 PM. If you have any questions, ask your nurse or doctor.             CONTINUE taking these medications      acetaminophen 500 mg tablet  Commonly known as: Tylenol  TAKE TWO TABLETS BY MOUTH EVERY 8 HOURS FOR PAIN FOR 30 DAYS    amLODIPine 10 mg tablet  Commonly known as: Norvasc  Take 1 tablet by mouth daily.    bisacodyL 5 mg delayed release tablet  Commonly known as: Dulcolax    carboxymethylcellulose 1 % ophthalmic solution  Commonly known as: Refresh Liquigel  APPLY 1 DROP TO BOTH EYES FOUR TIMES A DAY FOR DRY EYES.    cetirizine 10 mg tablet  Commonly known as: Zyrtec  Take 1 tablet by mouth daily.    chlorthalidone 25 mg tablet  Commonly known as: Thalitone  Take 1 tablet by mouth daily.  cholecalciferol-vitamin D3 50 mcg (2,000 unit) tablet  Take 1 tablet by mouth daily.    cyanocobalamin 1,000 mcg tablet  Take 1 tablet by mouth daily.    cyclobenzaprine 5 mg tablet  Commonly known as: Flexeril  Take 1 tablet by mouth 3 times daily.    docusate sodium 100 mg capsule  Commonly known as: Colace  Take 1 capsule by mouth 2 times daily. TAKE AS LONG AS TAKING NARCOTIC PAIN MEDICATION    Golytely 236-22.74-6.74 -5.86 gram oral solution  Generic drug: polyethylene glycol    ibuprofen 800 mg tablet  Commonly known as: Motrin  TAKE ONE TABLET BY MOUTH THREE TIMES DAILY IF NEEDED FOR PAIN (SHOULD NOT BE TAKEN WITH ASPIRIN WITHOUT YOUR PROVIDER'S KNOWLEDGE.)    * ketoconazole 2 % cream  Commonly known as: Nizoral  APPLY A SMALL AMOUNT TOPICALLY TWICE A DAY AS NEEDED FOR SKIN CONDITION.    * ketoconazole 2 % shampoo  Commonly known as: Nizoral  SHAMPOO WITH A SUFFICIENT AMOUNT TOPICALLY ON MONDAY AND THURSDAY FOR FUNGAL INFECTION. APPLY TO THE DAMP  SKIN OF THE AFFECTED AREA AND A WIDE MARGIN SURROUNDING THE AREA. LATHER and LEAVE IN PLACE FOR 5 MIN, THEN RINSE OFF WITH WATER.    lidocaine 5 % transdermal patch  Commonly known as: Lidoderm  APPLY 1 TO 2 PATCHES TO THE SKIN ONCE A DAY BY APPLYING AND PRESSING FIRMLY FOR 10-15 SECONDS.  KEEP ON FOR 12 HOURS THEN REMOVE FOR 12 HOURS. *MAY CUT INTO SMALLER SIZES PRIOR TO REMOVAL OF RELEASE LINER* FOR PAIN.    * losartan 100 mg tablet  Commonly known as: Cozaar  Take 1 tablet by mouth daily.    * losartan 100 mg tablet  Commonly known as: Cozaar  Take 1 tablet by mouth daily.    magnesium oxide 400 mg (241.3 mg magnesium) tablet  Commonly known as: Mag-Ox  TAKE TWO TABLETS BY MOUTH AT BEDTIME FOR PAIN AND INFLAMMATION    omega-3 fatty acids-fish oil 340-1,000 mg capsule  Take 1 capsule by mouth 3 times daily.    omeprazole 20 mg delayed release capsule  Commonly known as: Prilosec    oxyCODONE IR 10 mg immediate release tablet  Commonly known as: Roxicodone  Take 1 tablet by mouth every 4 hours as needed for severe pain.    oxyCODONE-acetaminophen 10-325 mg tablet  Commonly known as: Percocet  (Schedule II Drug) TAKE 1 TABLET BY MOUTH THREE TIMES A DAY PRN PAIN Oral for 30    pregabalin 100 mg capsule  Commonly known as: Lyrica  Take 1 capsule by mouth 2 times daily.    PROBIOTIC ACIDOPHILUS ORAL  Take by mouth.    * semaglutide 0.25 mg or 0.5 mg (2 mg/3 mL) subcutaneous pen injector  Commonly known as: Ozempic  Inject 0.25 mg under the skin every 7 days.    * semaglutide 0.25 mg or 0.5 mg (2 mg/3 mL) subcutaneous pen injector  Commonly known as: Ozempic  Inject 0.25 mg under the skin every 7 days for 28 days.    sennosides 8.6 mg tablet  Commonly known as: Senokot  Take 1 tablet by mouth 2 times daily. Take with at least 8 oz of water.    sildenafiL 100 mg tablet  Commonly known as: Viagra  Take 1 tablet by mouth as needed. Take 30 minutes-4 hours before sexual activity.    Symproic 0.2 mg tablet  Generic drug:  naldemedine  TAKE 1 TABLET BY MOUTH EVERY  DAY    * tadalafiL 10 mg tablet  Commonly known as: Cialis  1 tablet.    * tadalafiL 5 mg tablet  Commonly known as: Cialis  Take 1 tablet by mouth daily as needed.    tamsulosin 0.4 mg 24 hr extended release capsule  Commonly known as: Flomax  Take 1 capsule by mouth daily. Take 30 minutes after the same meal each day.    traZODone 100 mg tablet  Commonly known as: Desyrel  Take 1 tablet by mouth at bedtime.    triamcinolone acetonide 0.1 % cream  Commonly known as: Kenalog  Apply a small amount topically three times daily if needed to itchy rash    vitamin E, tocopherol 670 mg (1,000 unit) capsule  Take 1 capsule by mouth daily.    zoster recomb., adjuvanted (PF) IM injection  Commonly known as: Shingrix  Inject 0.5 mL into the muscle as directed. Repeat in 2 to 6 months.         * This list has 8 medication(s) that are the same as other medications prescribed for you. Read the directions carefully, and ask your doctor or other care provider to review them with you.             Past Surgical History:   Procedure Laterality Date    BACK SURGERY      NECK SURGERY      PR REPAIR OF HYDROCELE,TUNICA Bilateral 07/14/2018    Procedure: URO REPAIR OF TUNICA VAGINALIS HYDROCELE (BOTTLE TYPE);  Surgeon: Jacklynn Lewis, MD;  Location: [facility];  Service: Urology    PR REPAIR OF HYDROCELE,TUNICA Bilateral 12/08/2018    Procedure: REPAIR OF TUNICA VAGINALIS; HYDROCELE;  Surgeon: Jacklynn Lewis, MD;  Location: [facility];  Service: Urology     I have reviewed above past medical history, surgical history, current medications & discussed with patient during visit.     Objective:   Physical Exam  Constitutional:       General: He is not in acute distress.     Appearance: He is not diaphoretic.   HENT:      Head: Normocephalic and atraumatic.      Right Ear: External ear normal.      Left Ear: External ear normal.   Eyes:      General:         Right eye: No discharge.         Left eye: No  discharge.      Conjunctiva/sclera: Conjunctivae normal.      Pupils: Pupils are equal, round, and reactive to light.   Neck:      Thyroid: No thyromegaly.   Pulmonary:      Effort: Pulmonary effort is normal. No respiratory distress.   Musculoskeletal:         General: Normal range of motion.   Skin:     General: Skin is dry.   Neurological:      Mental Status: He is alert and oriented to person, place, and time.      Cranial Nerves: No cranial nerve deficit.   Psychiatric:         Mood and Affect: Mood and affect normal.         Cognition and Memory: Memory normal.         Judgment: Judgment normal.     Vitals:    09/09/22 1603   BP: 129/79   Pulse: 84   SpO2: 94%   Weight: Marland Kitchen)  104.2 kg (229 lb 12.8 oz)   Height: 188 cm (6' 2"$ )     Vitals:    09/09/22 1603   BP: 129/79   Pulse: 84   SpO2: 94%   Weight: (!) 104.2 kg (229 lb 12.8 oz)   Height: 188 cm (6' 2"$ )     Assessment /Plan:       ICD-10-CM ICD-9-CM    1. Essential hypertension  I10 401.9 losartan (Cozaar) 100 mg tablet   2. Orthostatic dizziness  R42 780.4    3. Type 2 diabetes mellitus without complication, without long-term current use of insulin  E11.9 250.00      No orders of the defined types were placed in this encounter.    1. HTN: now well controlled but symptomatic from relative large drop in SBP. Will stop Chlorthalidone and continue Losartan 100 mg and norvasc 10 mg at night . Cont monitoring at home,     2. DMII: cont Ozempic; re-eval at next visit     Return in about 2 months (around 11/08/2022).    Electronically signed by Jessica Martinique Hund, MD at 09/09/2022  5:03 PM EST

## 2022-09-11 ENCOUNTER — Ambulatory Visit: Admit: 2022-09-11 | Discharge: 2022-09-11 | Payer: MEDICARE | Attending: Internal Medicine

## 2022-09-11 DIAGNOSIS — R0789 Other chest pain: Secondary | ICD-10-CM

## 2022-09-11 DIAGNOSIS — R42 Dizziness and giddiness: Secondary | ICD-10-CM

## 2022-09-11 NOTE — Progress Notes (Unsigned)
[facility] - NEW PATIENT VISIT    Date:  September 10, 2022  Patient name: Dominic Knight.  Date of Birth: 11-29-1955    ASSESSMENT AND RECOMMENDATIONS          1. Chest pain, atypical  ***    2. Essential hypertension  ***    3. Mixed hyperlipidemia  ***    4. Type 2 diabetes mellitus without complication, without long-term current use of insulin (HCC)  ***        Thank you for allowing me to participate in the care of your patient.      Adah Perl, MD  Cardiologist  [facility] / [facility]      HISTORY OF PRESENT ILLNESS          Chief Complaint:  No chief complaint on file.      Dominic Knight. is a 67 y.o. male with history significant for hypertension, hyperlipidemia, diabetes mellitus type 2, and GERD who presents for evaluation of chest pain.      In discussion today, ***      CARDIAC DIAGNOSTICS         LIPIDS (05/09/2021):  Total 191, Trig 109, LDL 122, HDL 47     No results found for this or any previous visit (from the past 4464 hour(s)).    No results found for this or any previous visit.      No results found for this or any previous visit.       No results found for this or any previous visit.         PAST MEDICAL, SOCIAL AND FAMILY HISTORY          Past Medical History:   has a past medical history of Acid reflux, Anxiety, Claustrophobia, Depression, HBP (high blood pressure), High cholesterol, Shoulder pain, Snorings, Tear meniscus knee, and Wears glasses.    Past Surgical History:   has a past surgical history that includes Cervical spine surgery; Lumbar spine surgery; Shoulder arthroscopy (Right, 07/07/2019); Knee arthroscopy (Right); Colonoscopy; and Knee arthroscopy (Left, 01/07/2022).     Social History:   reports that he quit smoking about 24 years ago. His smoking use included cigarettes. He has never used smokeless tobacco. He reports current alcohol use of about 1.0 standard  drink of alcohol per week. He reports that he does not use drugs.     Family History: family history is not on file.      MEDICATIONS AND ALLERGIES           Prior to Admission medications    Medication Sig Start Date End Date Taking? Authorizing Provider   methylPREDNISolone (MEDROL, PAK,) 4 MG tablet Take by mouth.as directed 01/17/22   Roxanne Mins, MD   cyclobenzaprine (FLEXERIL) 10 MG tablet Take 1 tablet by mouth 3 times daily as needed for Muscle spasms    [provider]   ondansetron (ZOFRAN) 4 MG tablet Take 1 tablet by mouth every 8 hours as needed for Nausea or Vomiting 01/07/22   Rochel Brome, PA   esomeprazole Magnesium (NEXIUM) 20 MG PACK Take 1 packet by mouth nightly as needed (INDIGESTION)  Patient not taking: Reported on 01/07/2022    [provider]   Omega-3 Fatty Acids (FISH OIL) 1000 MG capsule Take 1 capsule by mouth daily    [provider]   hydroCHLOROthiazide (HYDRODIURIL) 25 MG tablet Take 1 tablet by mouth daily  Patient  not taking: Reported on 01/07/2022    [provider]   GNP PAIN RELIEF EX-STRENGTH 500 MG tablet Take 1 tablet by mouth every 8 hours as needed for Pain 02/07/21   [provider]   Cyanocobalamin (VITAMIN B 12) 500 MCG TABS Take 1 tablet by mouth every morning 02/26/21   [provider]   triamcinolone (KENALOG) 0.1 % cream 0.1 g 3 times daily 02/06/21   [provider]   oxyCODONE-acetaminophen (PERCOCET) 10-325 MG per tablet Take 1 tablet by mouth every 8 hours as needed for Pain.    [facility] [employer], [facility], MD       Allergies:  Gabapentin and Prochlorperazine      PHYSICAL EXAM          There were no vitals taken for this visit.     Wt Readings from Last 3 Encounters:   01/17/22 106.1 kg (234 lb)   01/07/22 106.2 kg (234 lb 2.1 oz)   11/11/21 104.3 kg (230 lb)       Physical Exam

## 2022-09-11 NOTE — Patient Instructions (Signed)
CALL THE OFFICE IN 2 WEEKS TO UPDATE ON YOUR SYMPTOMS AFTER GETTING OFF THE CHLORTHALIDONE

## 2022-10-22 ENCOUNTER — Inpatient Hospital Stay: Admit: 2022-10-22 | Payer: MEDICARE | Attending: Internal Medicine

## 2022-10-22 DIAGNOSIS — I083 Combined rheumatic disorders of mitral, aortic and tricuspid valves: Secondary | ICD-10-CM

## 2022-10-22 DIAGNOSIS — R55 Syncope and collapse: Secondary | ICD-10-CM

## 2022-10-22 LAB — ECHO (TTE) COMPLETE (PRN CONTRAST/BUBBLE/STRAIN/3D)
AR Max Velocity PISA: 4.5 m/s
AR PHT: 388 ms
AV Regurg Fraction: -1300 %
AV Regurg Volume: -1239.9 mL
Ao Root Index: 1.54 cm/m2
Aortic Arch: 2.7 cm
Aortic Root: 3.5 cm
Aortic Sinus Valsalva Index: 1.14 cm/m2
Aortic Sinus Valsalva: 2.6 cm
Ascending Aorta Index: 1.4 cm/m2
Ascending Aorta: 3.2 cm
Body Surface Area: 2.3 m2
Descending Aorta Index: 1.1 cm/m2
Descending Aorta: 2.5 cm
E/E' Lateral: 11.11
E/E' Ratio (Averaged): 11.11
EF BP: 61 % (ref 55–100)
Est. RA Pressure: 3 mmHg
Fractional Shortening 2D: 31 % (ref 28–44)
Global Longitudinal Strain: -23.5 %
IVC Expiration: 2.1 cm
IVSd: 1 cm (ref 0.6–1.0)
LA Area 2C: 19.1 cm2
LA Area 4C: 20.1 cm2
LA Diameter: 3.9 cm
LA Major Axis: 5.6 cm
LA Minor Axis: 5.2 cm
LA Size Index: 1.71 cm/m2
LA Volume Index MOD A2C: 25 ml/m2 (ref 16–34)
LA Volume Index MOD A4C: 25 ml/m2 (ref 16–34)
LA Volume MOD A2C: 57 mL (ref 18–58)
LA Volume MOD A4C: 58 mL (ref 18–58)
LA/AO Root Ratio: 1.11
LV E' Lateral Velocity: 9 cm/s
LV E' Septal Velocity: 9 cm/s
LV EDV A2C: 95 mL
LV EDV A4C: 113 mL
LV EDV Index A2C: 42 mL/m2
LV EDV Index A4C: 50 mL/m2
LV ESV A2C: 41 mL
LV ESV A4C: 38 mL
LV ESV Index A2C: 18 mL/m2
LV ESV Index A4C: 17 mL/m2
LV Ejection Fraction A2C: 57 %
LV Ejection Fraction A4C: 66 %
LV Mass 2D Index: 79.8 g/m2 (ref 49–115)
LV Mass 2D: 181.9 g (ref 88–224)
LV RWT Ratio: 0.46
LVIDd Index: 2.11 cm/m2
LVIDd: 4.8 cm (ref 4.2–5.9)
LVIDs Index: 1.45 cm/m2
LVIDs: 3.3 cm
LVOT Area: 3.8 cm2
LVOT Diameter: 2.2 cm
LVOT Mean Gradient: 3 mmHg
LVOT Peak Gradient: 6 mmHg
LVOT Peak Velocity: 1.3 m/s
LVOT SV: 95.4 ml
LVOT Stroke Volume Index: 41.8 mL/m2
LVOT VTI: 25.1 cm
LVPWd: 1.1 cm — AB (ref 0.6–1.0)
MR Peak Gradient: 130 mmHg
MR Peak Velocity: 5.7 m/s
MR Radius PISA: 0.7 cm
MR Regurg Volume: 1239.9 cm3
MR VTI: 177 cm
MV A Velocity: 0.84 m/s
MV Annulus Diameter: 3.1 cm
MV Area by VTI: 3.2 cm2
MV E Velocity: 1 m/s
MV E Wave Deceleration Time: 242 ms
MV E/A: 1.19
MV Max Velocity: 1 m/s
MV Mean Gradient: 2 mmHg
MV Mean Velocity: 0.7 m/s
MV Nyquist Velocity: 39 cm/s
MV Peak Gradient: 4 mmHg
MV Regurg Fraction Cont Eq: 93 %
MV VTI: 29.4 cm
MV:LVOT VTI Index: 1.17
RA Area 4C: 17.8 cm2
RV Basal Dimension: 3 cm
RVIDd: 3.4 cm
RVOT Area: 8 cm2
RVOT Diameter: 3.2 cm
RVSP: 36 mmHg
TAPSE: 2.7 cm (ref 1.7–?)
TR Max Velocity: 2.86 m/s
TR Peak Gradient: 33 mmHg

## 2022-10-22 NOTE — Other (Signed)
Dominic Knight,  Your recent echocardiogram demonstrated some mild abnormalities, but nothing that requires immediate attention.  Your left ventricular function is normal.    You are found to have regurgitation/leak in your aortic valve, which is moderate in severity.  The mitral valve also has a mild amount of leak.  Both of these things we can follow clinically, and with routine echocardiogram on an annual basis, unless something changes clinically.    Otherwise, no issues that are immediate cause for concern.    Best,  Sharon Mt, MD

## 2022-11-07 NOTE — Telephone Encounter (Signed)
-----   Message from Dominic Hove Sr. sent at 11/06/2022  9:03 PM EDT -----  Regarding: Medication   Contact: [phone]  Dr. Janae Bridgeman,    I have been bit confused regarding the medications I am to take. [facility] and you. My medication list:  Amlodipine 10mg   Chlorthalidone 25mg   Metoprolol Succinate 25mg   I suspended all HBP for 10 days and started using herbals and exercise. Then initially my pressure averaged 137/65 and then it spiked back up to 186/89 on average over the last two days.  I am ready to start and continue taking HBP medication and will stick with what medication YOU prescribe.     All The Best,    Dominic Hove, Sr.  [phone]

## 2022-11-10 NOTE — Telephone Encounter (Signed)
Pt notified. Updated meds in chart.

## 2023-01-04 ENCOUNTER — Emergency Department: Admit: 2023-01-04 | Payer: MEDICARE

## 2023-01-04 ENCOUNTER — Inpatient Hospital Stay
Admit: 2023-01-04 | Discharge: 2023-01-04 | Disposition: A | Discharge status: Home / Self Care | Payer: MEDICARE | Attending: Emergency Medicine

## 2023-01-04 DIAGNOSIS — J209 Acute bronchitis, unspecified: Secondary | ICD-10-CM

## 2023-01-04 LAB — CBC WITH AUTO DIFFERENTIAL
Basophils %: 0.9 % (ref 0.0–2.0)
Basophils Absolute: 0 10*3/uL (ref 0.0–0.2)
Eosinophils %: 3.8 % (ref 0.0–7.0)
Eosinophils Absolute: 0.2 10*3/uL (ref 0.0–0.5)
Hematocrit: 43.7 % (ref 38.0–52.0)
Hemoglobin: 14.2 g/dL (ref 13.0–17.3)
Immature Grans (Abs): 0.01 10*3/uL (ref 0.00–0.06)
Immature Granulocytes %: 0.2 % (ref 0.0–0.6)
Lymphocytes Absolute: 2.5 10*3/uL (ref 1.0–3.2)
Lymphocytes: 54.8 % — ABNORMAL HIGH (ref 15.0–45.0)
MCH: 30.1 pg (ref 27.0–34.5)
MCHC: 32.5 g/dL (ref 32.0–36.0)
MCV: 92.8 fL (ref 84.0–100.0)
MPV: 9.5 fL (ref 7.2–13.2)
Monocytes %: 9.4 % (ref 4.0–12.0)
Monocytes Absolute: 0.4 10*3/uL (ref 0.3–1.0)
Neutrophils %: 30.9 % — ABNORMAL LOW (ref 42.0–74.0)
Neutrophils Absolute: 1.4 10*3/uL — ABNORMAL LOW (ref 1.6–7.3)
Platelets: 185 10*3/uL (ref 140–440)
RBC: 4.71 x10e6/mcL (ref 4.00–5.60)
RDW: 13 % (ref 11.0–16.0)
WBC: 4.5 10*3/uL (ref 3.8–10.6)

## 2023-01-04 LAB — BASIC METABOLIC PANEL
Anion Gap: 12 mmol/L (ref 2–17)
BUN: 10 mg/dL (ref 8–23)
CO2: 28 mmol/L (ref 22–29)
Calcium: 10.3 mg/dL (ref 8.5–10.7)
Chloride: 96 mmol/L — ABNORMAL LOW (ref 98–107)
Creatinine: 1 mg/dL (ref 0.7–1.3)
Est, Glom Filt Rate: 82 mL/min/1.73m (ref >=60)
Glucose: 103 mg/dL — ABNORMAL HIGH (ref 70–99)
Osmolaliy Calculated: 271 mOsm/kg (ref 270–287)
Potassium: 3.6 mmol/L (ref 3.5–5.3)
Sodium: 136 mmol/L (ref 135–145)

## 2023-01-04 MED ORDER — ALBUTEROL SULFATE HFA 108 (90 BASE) MCG/ACT IN AERS
10890 (90 Base) MCG/ACT | Freq: Four times a day (QID) | RESPIRATORY_TRACT | 0 refills | Status: AC | PRN
Start: 2023-01-04 — End: ?

## 2023-01-04 MED ORDER — DOXYCYCLINE HYCLATE 100 MG PO CAPS
100 | Freq: Once | ORAL | Status: AC
Start: 2023-01-04 — End: 2023-01-04
  Administered 2023-01-04: 20:00:00 100 mg via ORAL

## 2023-01-04 MED ORDER — SODIUM CHLORIDE 0.9 % IV BOLUS
0.9 | Freq: Once | INTRAVENOUS | Status: AC
Start: 2023-01-04 — End: 2023-01-04
  Administered 2023-01-04: 19:00:00 1000 mL via INTRAVENOUS

## 2023-01-04 MED ORDER — PREDNISONE 20 MG PO TABS
20 | Freq: Once | ORAL | Status: AC
Start: 2023-01-04 — End: 2023-01-04
  Administered 2023-01-04: 20:00:00 40 mg via ORAL

## 2023-01-04 MED ORDER — PREDNISONE 20 MG PO TABS
20 MG | ORAL_TABLET | Freq: Two times a day (BID) | ORAL | 0 refills | Status: AC
Start: 2023-01-04 — End: 2023-01-09

## 2023-01-04 MED ORDER — DOXYCYCLINE HYCLATE 100 MG PO TABS
100 MG | ORAL_TABLET | Freq: Two times a day (BID) | ORAL | 0 refills | Status: AC
Start: 2023-01-04 — End: 2023-01-11

## 2023-01-04 MED FILL — PREDNISONE 20 MG PO TABS: 20 MG | ORAL | Qty: 2

## 2023-01-04 MED FILL — DOXYCYCLINE HYCLATE 100 MG PO CAPS: 100 MG | ORAL | Qty: 1
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
## 2023-03-13 ENCOUNTER — Inpatient Hospital Stay
Admit: 2023-03-13 | Discharge: 2023-03-13 | Disposition: A | Discharge status: Home / Self Care | Payer: MEDICARE | Attending: Emergency Medicine

## 2023-03-13 DIAGNOSIS — S50851A Superficial foreign body of right forearm, initial encounter: Secondary | ICD-10-CM

## 2023-03-13 MED ORDER — LIDOCAINE HCL 1 % IJ SOLN
1 | Freq: Once | INTRAMUSCULAR | Status: AC
Start: 2023-03-13 — End: 2023-03-13
  Administered 2023-03-13: 19:00:00 5 mL via INTRADERMAL

## 2023-03-13 MED FILL — LIDOCAINE HCL 1 % IJ SOLN: 1 % | INTRAMUSCULAR | Qty: 20

## 2023-03-13 NOTE — ED Provider Notes (Signed)
[facility] DE[facility]  EMERGENCY DEPARTMENT ENCOUNTER      Pt Name: Dominic KINAS Sr.  MRN: [id number]  Birthdate 01-Feb-1956  Date of evaluation: 03/13/2023  Provider: Fara Olden, MD    CHIEF COMPLAINT       Chief Complaint   Patient presents with    Foreign Body in Skin     Pt has fish hook in R forearm, bleeding controlled. Pt states Tdap should be UTD.          HISTORY OF PRESENT ILLNESS    Patient is a 67 year old male who presents the emergency department for fishhook stuck in his right forearm.  This was a brand-new fishhook that had not touched any water.  He excellently got it caught in his right volar forearm prior to arrival.  His tetanus was updated about 1 year ago.  He denies any numbness or weakness.  He denies any active bleeding.             Nursing Notes were reviewed.    REVIEW OF SYSTEMS     Review of Systems   All other systems reviewed and are negative.      Except as noted above the remainder of the review of systems was reviewed and negative.     PAST MEDICAL HISTORY     Past Medical History:   Diagnosis Date    Acid reflux     Anxiety     Claustrophobia     Depression     HBP (high blood pressure)     High cholesterol     Shoulder pain     Snorings     Tear meniscus knee     Wears glasses        SURGICAL HISTORY       Past Surgical History:   Procedure Laterality Date    CERVICAL SPINE SURGERY      x2    COLONOSCOPY      KNEE ARTHROSCOPY Right     x2    KNEE ARTHROSCOPY Left 01/07/2022    LEFT KNEE ARTHROSCOPY WITH PARTIAL MEDIAL MENISECTOMY, CHONDROPLASTY, (PRP) PLATELET RICH PLASMA INJECTION performed by Roxanne Mins, MD at [facility] MAIN OR    LUMBAR SPINE SURGERY      x3    SHOULDER ARTHROSCOPY Right 07/07/2019       CURRENT MEDICATIONS       Previous Medications    ALBUTEROL SULFATE HFA (VENTOLIN HFA) 108 (90 BASE) MCG/ACT INHALER    Inhale 2 puffs into the lungs 4 times daily as needed for Wheezing    AMLODIPINE (NORVASC) 10 MG TABLET    Take 0.5 tablets by mouth daily    BUPROPION  (WELLBUTRIN XL) 300 MG EXTENDED RELEASE TABLET    Take 1 tablet by mouth daily    CETIRIZINE (ZYRTEC) 10 MG TABLET    Take 1 tablet by mouth daily    CHLORTHALIDONE (HYGROTON) 25 MG TABLET    Take 1 tablet by mouth daily    DOCUSATE (COLACE, DULCOLAX) 100 MG CAPS    Take 300 mg by mouth daily    ESOMEPRAZOLE MAGNESIUM (NEXIUM) 20 MG PACK    Take 1 packet by mouth nightly as needed (INDIGESTION)    FINASTERIDE (PROSCAR) 5 MG TABLET    Take 1 tablet by mouth daily    GNP PAIN RELIEF EX-STRENGTH 500 MG TABLET    Take 1 tablet by mouth every 8 hours as needed for Pain    METOPROLOL SUCCINATE (  TOPROL XL) 25 MG EXTENDED RELEASE TABLET    Take 1 tablet by mouth daily    OMEGA-3 FATTY ACIDS (FISH OIL) 1000 MG CAPSULE    Take 1 capsule by mouth daily    OXYCODONE-ACETAMINOPHEN (PERCOCET) 10-325 MG PER TABLET    Take 1 tablet by mouth every 8 hours as needed for Pain.    SENNA (SENOKOT) 8.6 MG TABLET    Take 2 tablets by mouth daily    SILDENAFIL (VIAGRA) 100 MG TABLET    Take 1 tablet by mouth as needed    TADALAFIL (CIALIS) 5 MG TABLET    Take 1 tablet by mouth daily    TRIAMCINOLONE (KENALOG) 0.1 % CREAM    0.1 g 3 times daily       ALLERGIES     Gabapentin and Prochlorperazine    FAMILY HISTORY       Family History   Problem Relation Age of Onset    Hypertension Mother     Heart Failure Mother     Elevated Lipids Mother     Heart Attack Father     Heart Attack Brother     Heart Attack Brother     Diabetes Brother         SOCIAL HISTORY       Social History     Socioeconomic History    Marital status: Married   Tobacco Use    Smoking status: Former     Current packs/day: 0.00     Average packs/day: 0.3 packs/day for 28.0 years (7.0 ttl pk-yrs)     Types: Cigarettes     Start date: 61     Quit date: 2000     Years since quitting: 24.6    Smokeless tobacco: Never   Vaping Use    Vaping status: Never Used   Substance and Sexual Activity    Alcohol use: Yes     Alcohol/week: 1.0 standard drink of alcohol     Types: 1 Glasses  of wine per week    Drug use: Never     Social Determinants of Health     Food Insecurity: No Food Insecurity (01/09/2023)    Received from [facility]    Hunger Vital Sign     Worried About Running Out of Food in the Last Year: Never true     Ran Out of Food in the Last Year: Never true   Transportation Needs: No Transportation Needs (01/09/2023)    Received from [facility]    PRAPARE - Therapist, art (Medical): No     Lack of Transportation (Non-Medical): No       SCREENINGS         Glasgow Coma Scale  Eye Opening: Spontaneous  Best Verbal Response: Oriented  Best Motor Response: Obeys commands  Glasgow Coma Scale Score: 15                     CIWA Assessment  BP: (!) 152/75  Pulse: 70                 PHYSICAL EXAM       ED Triage Vitals [03/13/23 1346]   BP Systolic BP Percentile Diastolic BP Percentile Temp Temp Source Pulse Respirations SpO2   (!) 152/75 -- -- 98.4 F (36.9 C) Oral 70 18 97 %      Height Weight - Scale  1.88 m (6\' 2" ) 100.7 kg (222 lb)             Physical Exam  Vitals and nursing note reviewed.   Constitutional:       Appearance: Normal appearance.   HENT:      Head: Normocephalic and atraumatic.      Nose: Nose normal.      Mouth/Throat:      Mouth: Mucous membranes are moist.   Pulmonary:      Effort: Pulmonary effort is normal.   Musculoskeletal:         General: No swelling or tenderness.      Cervical back: Neck supple.   Skin:     General: Skin is warm and dry.      Comments: There is a fishhook embedded into the superficial skin of the right volar forearm.  Palpable radial and ulnar pulse.  Sensation intact in radial, ulnar, and median nerve distribution.  Full range of motion.   Neurological:      General: No focal deficit present.      Mental Status: He is alert and oriented to person, place, and time.   Psychiatric:         Mood and Affect: Mood normal.         Behavior: Behavior normal.          Foreign Body    Date/Time: 03/13/2023 2:32 PM    Performed by: Fara Olden, MD  Authorized by: Fara Olden, MD    Consent:     Consent obtained:  Verbal    Consent given by:  Patient    Risks, benefits, and alternatives were discussed: yes      Risks discussed:  Bleeding, infection, pain, incomplete removal, nerve damage, poor cosmetic result and worsening of condition    Alternatives discussed:  No treatment and alternative treatment  Universal protocol:     Patient identity confirmed:  Verbally with patient  Location:     Location:  Arm    Arm location:  R forearm    Depth:  Subcutaneous    Tendon involvement:  None  Pre-procedure details:     Imaging:  None    Neurovascular status: intact      Preparation: Patient was prepped and draped in usual sterile fashion    Anesthesia:     Anesthesia method:  Local infiltration    Local anesthetic:  Lidocaine 1% w/o epi  Procedure type:     Procedure complexity:  Simple  Procedure details:     Dissection of underlying tissues: no      Foreign bodies recovered:  1  Post-procedure details:     Neurovascular status: intact      Skin closure:  None    Dressing:  Bulky dressing    Procedure completion:  Tolerated  Comments:      Fishhook was advanced through skin with the assistance of a very small incision with 11 blade scalpel and pickups.  Wound was with Betadine and saline.  Bulky dressing was applied.      DIAGNOSTIC RESULTS     EKG: All EKG's are interpreted by the Emergency Department Physician who either signs or Co-signs this chart in the absence of a cardiologist.    RADIOLOGY:   No orders to display       LABS:  Labs Reviewed - No data to display    All other labs were within normal range or not returned as of this dictation.  EMERGENCY DEPARTMENT COURSE/REASSESSMENT and MDM:   MDM  Number of Diagnoses or Management Options  Fish hook in forearm  Diagnosis management comments: This is relatively low risk for infection.  This is a brand-new  fishhook.  The fishhook did not touch the water.  However given the fact that it is a puncture wound and at least deep to the subcutaneous tissue I recommended antibiotics which patient did not want.  Was soaked.  He was given strict return precautions.  He was advised close follow-up.  All questions were answered.  He is neurovascularly intact.          I reviewed available prior notes.  I ordered above listed tests.  I reviewed results of any tests ordered during this visit.            FINAL IMPRESSION      1. Fish hook in forearm          DISPOSITION/PLAN   DISPOSITION Decision To Discharge 03/13/2023 02:23:42 PM  Condition at Disposition: Good      PATIENT REFERRED TO:  [facility] EMERGENCY DEPT  [address] [place] [address]  [phone]    If symptoms worsen    Hund, Jessica [place], MD  [address]  [place] [address]  [phone]      As needed      DISCHARGE MEDICATIONS:  New Prescriptions    No medications on file     Controlled Substances Monitoring:          No data to display                (Please note that portions of this note were completed with a voice recognition program.  Efforts were made to edit the dictations but occasionally words are mis-transcribed.)    Fara Olden, MD (electronically signed)  Attending Emergency Physician        Fara Olden, MD  03/13/23 1434

## 2023-12-25 ENCOUNTER — Ambulatory Visit: Admit: 2023-12-25 | Discharge: 2023-12-25 | Payer: Medicare (Managed Care)

## 2023-12-25 ENCOUNTER — Ambulatory Visit: Admit: 2023-12-25 | Discharge: 2023-12-25 | Payer: BLUE CROSS/BLUE SHIELD | Attending: Surgical

## 2023-12-25 DIAGNOSIS — M67912 Unspecified disorder of synovium and tendon, left shoulder: Secondary | ICD-10-CM

## 2023-12-25 NOTE — Progress Notes (Signed)
 CC:   Chief Complaint   Patient presents with    Shoulder Pain     L       HPI: 12/25/2023: The patient is a retired Fish farm manager.  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  He states that he is noticed left shoulder pain since February of this year.  He is unable to identify an injury that may have contributed to his pai, but does recall a moment whenever he internally rotated and felt a sharp searing pain.  Since then he has rated his pain as high as a 10 out of 10 with internal rotation and overhead activity he is not receiving injections or physical therapy.  He is active with housework      Current Outpatient Medications on File Prior to Visit   Medication Sig Dispense Refill    albuterol  sulfate HFA (VENTOLIN  HFA) 108 (90 Base) MCG/ACT inhaler Inhale 2 puffs into the lungs 4 times daily as needed for Wheezing 18 g 0    metoprolol succinate (TOPROL XL) 25 MG extended release tablet Take 1 tablet by mouth daily      chlorthalidone (HYGROTON) 25 MG tablet Take 1 tablet by mouth daily      tadalafil (CIALIS) 5 MG tablet Take 1 tablet by mouth daily      sildenafil (VIAGRA) 100 MG tablet Take 1 tablet by mouth as needed      finasteride (PROSCAR) 5 MG tablet Take 1 tablet by mouth daily      cetirizine (ZYRTEC) 10 MG tablet Take 1 tablet by mouth daily      docusate (COLACE, DULCOLAX) 100 MG CAPS Take 300 mg by mouth daily      senna (SENOKOT) 8.6 MG tablet Take 2 tablets by mouth daily      buPROPion (WELLBUTRIN XL) 300 MG extended release tablet Take 1 tablet by mouth daily      amLODIPine (NORVASC) 10 MG tablet Take 0.5 tablets by mouth daily      esomeprazole Magnesium (NEXIUM) 20 MG PACK Take 1 packet by mouth nightly as needed (INDIGESTION)      Omega-3 Fatty Acids (FISH OIL) 1000 MG capsule Take 1 capsule by mouth daily      GNP PAIN RELIEF EX-STRENGTH 500 MG tablet Take 1 tablet by mouth every 8 hours as needed for Pain      triamcinolone (KENALOG) 0.1 % cream 0.1 g 3 times daily       oxyCODONE -acetaminophen  (PERCOCET ) 10-325 MG per tablet Take 1 tablet by mouth every 8 hours as needed for Pain.       No current facility-administered medications on file prior to visit.          Allergies   Allergen Reactions    Gabapentin Anxiety and Hallucinations    Prochlorperazine Anxiety and Other (See Comments)     Event:    Dizziness  REACTION: "Flighty"  compazine           Past Medical History:   Diagnosis Date    Acid reflux     Anxiety     Claustrophobia     Depression     HBP (high blood pressure)     High cholesterol     Shoulder pain     Snorings     Tear meniscus knee     Wears glasses          Past Surgical History:   Procedure Laterality Date    CERVICAL SPINE SURGERY  x2    COLONOSCOPY      KNEE ARTHROSCOPY Right     x2    KNEE ARTHROSCOPY Left 01/07/2022    LEFT KNEE ARTHROSCOPY WITH PARTIAL MEDIAL MENISECTOMY, CHONDROPLASTY, (PRP) PLATELET RICH PLASMA INJECTION performed by Granville Layer, MD at [facility] MAIN OR    LUMBAR SPINE SURGERY      x3    SHOULDER ARTHROSCOPY Right 07/07/2019     No surgery found No surgery found     There were no vitals taken for this visit.       Exam:  Cervical Spine                          Right/Left  Extension [1]  Flexion [1]  Axial Rotation [1]/[1]  Lateral Bending [2]/[2]  Spurlings [0]/[0]    Shoulder                            Right/ Left  Forward Flexion [140]/[105] PTE:140 on left  Positive Dawburns  ER adducted [60] / [40]  IR adducted [T10]/ [L2 p]  ER abducted [90]/ [80 p]  IR abducted [60] / [10 p]         Rotaror Cuff  IS [5]/ [4 p]  Sub [5] / [5]  SS [5] / [4 p f -]    Positive empty can test    XR CERVICAL SPINE (2-3 VIEWS)  C-spine 2 views maintenance of normal lordotic curve.  There is an ACDF   from C4-C6 been and hardware into T1.  All of the hardware is intact there   are no lucencies facets are well-maintained uncovertebral joints show   degenerative changes with his fusions.  No acute fracture dislocation or   avulsion  XR SHOULDER LEFT (MIN  2 VIEWS)  Left shoulder 3 views shows mild narrowing of the glenohumeral joint   space.  There is a small inferior osteophyte off of the glenoid.  There is   sclerosis of the glenoid.  There is moderate sclerosis of the greater   tuberosity.  There is moderate degenerative changes acromioclavicular   joint.  No acute fracture dislocation or avulsion       Assessment:    1. Dysfunction of left rotator cuff  Assessment & Plan:   The patient presents today with history and physical exam consistent with a diagnosis of left rotator cuff dysfunction.  He also has some very mild glenohumeral joint space narrowing.  All of this was discussed with the patient.  He is in a considerable amount of pain and is trying to decide if he should move forward with knee surgery or shoulder surgery as both are pretty debilitating for him at this time.  He has been seen by the [place] and they recommended a home exercise program and therapy but he has not really noticed much improvement.  Considering all of this and his level of weakness we discussed moving forward with an MRI.  1 has been ordered for him.  He will likely obtain the MRI at the [place].  He was asked to bring the MRI on a CD with him to his follow-up appointment with Dr. DeMarco.  At that time they can discuss his surgical treatment options.  In the interim, we discussed activity modification to protect his rotator cuff, Slee positioning to minimize discomfort and the use of over-the-counter modalities as needed.  Follow-up with Dr.  DeMarco for his next available MRI review  Orders:  -     MRI SHOULDER LEFT WO CONTRAST; Future  2. Cervicalgia  -     XR CERVICAL SPINE (2-3 VIEWS)  3. Acute pain of left shoulder  -     XR SHOULDER LEFT (MIN 2 VIEWS)      Return in about 3 weeks (around 01/15/2024) for MRI review with Dr. Simuel Ducking .    Elmon Hagedorn, PA

## 2023-12-25 NOTE — Assessment & Plan Note (Addendum)
 The patient presents today with history and physical exam consistent with a diagnosis of left rotator cuff dysfunction.  He also has some very mild glenohumeral joint space narrowing.  All of this was discussed with the patient.  He is in a considerable amount of pain and is trying to decide if he should move forward with knee surgery or shoulder surgery as both are pretty debilitating for him at this time.  He has been seen by the [place] and they recommended a home exercise program and therapy but he has not really noticed much improvement.  Considering all of this and his level of weakness we discussed moving forward with an MRI.  1 has been ordered for him.  He will likely obtain the MRI at the [place].  He was asked to bring the MRI on a CD with him to his follow-up appointment with Dr. DeMarco.  At that time they can discuss his surgical treatment options.  In the interim, we discussed activity modification to protect his rotator cuff, Slee positioning to minimize discomfort and the use of over-the-counter modalities as needed.  Follow-up with Dr. DeMarco for his next available MRI review

## 2023-12-30 MED ORDER — DIAZEPAM 5 MG PO TABS
5 | ORAL_TABLET | ORAL | 0 refills | 10.00 days | Status: AC | PRN
Start: 2023-12-30 — End: 2024-01-01

## 2023-12-30 NOTE — Addendum Note (Signed)
 Addended by: Elmon Hagedorn on: 12/30/2023 08:01 AM     Modules accepted: Orders

## 2024-01-10 ENCOUNTER — Inpatient Hospital Stay: Admit: 2024-01-10 | Payer: Medicare (Managed Care)

## 2024-01-10 DIAGNOSIS — M67912 Unspecified disorder of synovium and tendon, left shoulder: Secondary | ICD-10-CM

## 2024-01-13 ENCOUNTER — Ambulatory Visit: Admit: 2024-01-13 | Payer: Medicare (Managed Care) | Attending: Sports Medicine

## 2024-01-13 DIAGNOSIS — M67912 Unspecified disorder of synovium and tendon, left shoulder: Secondary | ICD-10-CM

## 2024-01-13 NOTE — Progress Notes (Signed)
 CC:   Chief Complaint   Patient presents with    Shoulder Pain     MRI Review        HPI: 12/25/2023: The patient is a retired Fish farm manager.  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  He states that he is noticed left shoulder pain since February of this year.  He is unable to identify an injury that may have contributed to his pai, but does recall a moment whenever he internally rotated and felt a sharp searing pain.  Since then he has rated his pain as high as a 10 out of 10 with internal rotation and overhead activity he is not receiving injections or physical therapy.  He is active with housework.  01/13/24: Patient is here today for a left shoulder MRI review to determine the next course of treatment.  He received his images at [facility].  No change in pain since last visit        Current Outpatient Medications on File Prior to Visit   Medication Sig Dispense Refill    albuterol  sulfate HFA (VENTOLIN  HFA) 108 (90 Base) MCG/ACT inhaler Inhale 2 puffs into the lungs 4 times daily as needed for Wheezing 18 g 0    metoprolol succinate (TOPROL XL) 25 MG extended release tablet Take 1 tablet by mouth daily      chlorthalidone (HYGROTON) 25 MG tablet Take 1 tablet by mouth daily      tadalafil (CIALIS) 5 MG tablet Take 1 tablet by mouth daily      sildenafil (VIAGRA) 100 MG tablet Take 1 tablet by mouth as needed      finasteride (PROSCAR) 5 MG tablet Take 1 tablet by mouth daily      cetirizine (ZYRTEC) 10 MG tablet Take 1 tablet by mouth daily      docusate (COLACE, DULCOLAX) 100 MG CAPS Take 300 mg by mouth daily      senna (SENOKOT) 8.6 MG tablet Take 2 tablets by mouth daily      buPROPion (WELLBUTRIN XL) 300 MG extended release tablet Take 1 tablet by mouth daily      amLODIPine (NORVASC) 10 MG tablet Take 0.5 tablets by mouth daily      esomeprazole Magnesium (NEXIUM) 20 MG PACK Take 1 packet by mouth nightly as needed (INDIGESTION)      Omega-3 Fatty Acids (FISH OIL) 1000 MG capsule Take 1 capsule by mouth  daily      GNP PAIN RELIEF EX-STRENGTH 500 MG tablet Take 1 tablet by mouth every 8 hours as needed for Pain      triamcinolone (KENALOG) 0.1 % cream 0.1 g 3 times daily      oxyCODONE -acetaminophen  (PERCOCET ) 10-325 MG per tablet Take 1 tablet by mouth every 8 hours as needed for Pain.       No current facility-administered medications on file prior to visit.          Allergies   Allergen Reactions    Gabapentin Anxiety and Hallucinations    Prochlorperazine Anxiety and Other (See Comments)     Event:    Dizziness  REACTION: Flighty  compazine           Past Medical History:   Diagnosis Date    Acid reflux     Anxiety     Claustrophobia     Depression     HBP (high blood pressure)     High cholesterol     Shoulder pain     Snorings  Tear meniscus knee     Wears glasses          Past Surgical History:   Procedure Laterality Date    CERVICAL SPINE SURGERY      x2    COLONOSCOPY      KNEE ARTHROSCOPY Right     x2    KNEE ARTHROSCOPY Left 01/07/2022    LEFT KNEE ARTHROSCOPY WITH PARTIAL MEDIAL MENISECTOMY, CHONDROPLASTY, (PRP) PLATELET RICH PLASMA INJECTION performed by Granville Layer, MD at [facility] MAIN OR    LUMBAR SPINE SURGERY      x3    SHOULDER ARTHROSCOPY Right 07/07/2019     No surgery found No surgery found     There were no vitals taken for this visit.       Exam:  Cervical Spine                          Right/Left  Extension [1]  Flexion [1]  Axial Rotation [1]/[1]  Lateral Bending [2]/[2]  Spurlings [0]/[0]    Shoulder                            Right/ Left  Forward Flexion [140]/[105] PTE:140 on left  Positive Dawburns  ER adducted [60] / [40]  IR adducted [T10]/ [L2 p]  ER abducted [90]/ [80 p]  IR abducted [60] / [10 p]         Rotaror Cuff  IS [5]/ [4 p]  Sub [5] / [5]  SS [5] / [4 p f -]    Positive empty can test    MRI left shoulder January 10, 2024 [facility] reviewed by me.  Coronal cuts show significant interstitial tear supraspinatus.  It appears to be both both articular and bursal fibers  are intact but 50% interstitial has fluid in it.  There is some cartilage thinning inferiorly squaring off the inferior humeral head.  Minimal signal change in the superior labrum.  Moderate AC joint changes.  Joint effusion noted which is small but present.  Sagittal cuts show no atrophy or fatty infiltration.  Intra-articular biceps shows mild widening interstitial tear confirmed in the mid supraspinatus on sagittal cuts.  Axial cuts show intact subscapularis fluid around the rotator interval subcoracoid space, biceps within the groove.  Anterior and posterior labrum with some degenerative signal change throughout    No image results found.       Assessment:    1. Dysfunction of left rotator cuff  Assessment & Plan:  Small significant interstitial tear supraspinatus.  The risks and benefits of nonsurgical versus surgical treatment were discussed. The patient has undergone and failed several conservative measures including a home exercise program, therapy, injections, medications, and activity modification. I feel that the benefits of surgery outweigh the risks involved. Patient agrees with this. We talked about the details of the surgical procedure. The patient was given handouts and we discussed the risk and benefits of surgery. We talked about the pre-, peri-, and postoperative course and expected intraoperative and postoperative experience in rehabilitation. We gave the patient handouts detailing all of this and all questions were answered. Plan on doing left shoulder arthroscopy with rotator cuff repair.  Beachchair position.  Duration 90 minutes.  Preoperative single shot interscalene block by anesthesia.  Postoperative Trimed machine.  Patient will need preoperative medical clearance.    Will likely need to take down the small part of his  supraspinatus to get into that interstitial tear.  Also evaluate for capsulitis and possible treatment of biceps tendon  2. Cervicalgia  3. Orthopedic  aftercare  Overview:  History of right shoulder rotator cuff repair 2020 by me  4. Essential hypertension  Assessment & Plan:  Hypertension complicates the course of treatment by adding additional risks with incorporating anti-inflammatories, steroid injections or oral corticosteroids.  It also increases risk assessment perioperatively.        Return for Post Op.    Skylee Baird R Aalaiyah Yassin, MD

## 2024-01-13 NOTE — Assessment & Plan Note (Signed)
Hypertension complicates the course of treatment by adding additional risks with incorporating anti-inflammatories, steroid injections or oral corticosteroids.  It also increases risk assessment perioperatively.

## 2024-01-13 NOTE — Assessment & Plan Note (Addendum)
 Small significant interstitial tear supraspinatus.  The risks and benefits of nonsurgical versus surgical treatment were discussed. The patient has undergone and failed several conservative measures including a home exercise program, therapy, injections, medications, and activity modification. I feel that the benefits of surgery outweigh the risks involved. Patient agrees with this. We talked about the details of the surgical procedure. The patient was given handouts and we discussed the risk and benefits of surgery. We talked about the pre-, peri-, and postoperative course and expected intraoperative and postoperative experience in rehabilitation. We gave the patient handouts detailing all of this and all questions were answered. Plan on doing left shoulder arthroscopy with rotator cuff repair.  Beachchair position.  Duration 90 minutes.  Preoperative single shot interscalene block by anesthesia.  Postoperative Trimed machine.  Patient will need preoperative medical clearance.    Will likely need to take down the small part of his supraspinatus to get into that interstitial tear.  Also evaluate for capsulitis and possible treatment of biceps tendon

## 2024-01-14 NOTE — Addendum Note (Signed)
 Addended by: SILVER MILLMAN on: 01/14/2024 08:15 AM     Modules accepted: Orders

## 2024-01-15 NOTE — Telephone Encounter (Signed)
 Patient called would like to schedule surgery please assist

## 2024-01-18 NOTE — Telephone Encounter (Signed)
 SPOKE TO PT. SURGERY SCHEDULED.

## 2024-01-18 NOTE — Telephone Encounter (Signed)
 Returned call- left voicemail to return my call and schedule surgery with Dr Carlus.

## 2024-01-19 NOTE — Progress Notes (Signed)
 Pre Procedure Patient Instructions     Procedure Location hospital:[facility]: [address], [place] - [place] in the [facility] [address] [place]. Bring your parking ticket with you inside to be validated. Take the covered pathway to the Admitting Entrance.  Follow the signs to Outpatient Registration (down the hall on your left).  Once registered, someone will escort you to the Ambulatory Surgery department.   Procedure Date 02/02/24  Arrival Time Per Physicians Instructions    Your doctor determines your scheduled start time.  Depending on the facility where your procedure is taking place and the scheduled start time, you may be required to arrive up to 2 hours prior.  This is to allow time for registration, your preop assessment, any day of procedure testing and to meet with your care teams members.  This also allows your procedure to be performed earlier should there be a cancellation that day.  We appreciate your patience as we work to provide you with excellent service.    Medications: Follow the medication instructions below to prevent your procedure from being cancelled.    Medication to be taken the morning of surgery with a few sips of water only:   WELLBUTRIN  ZYRTEC  AMLODIPINE  FINASTERIDE  METOPROLOL  OXYCODONE  IF NEEDED  ALBUTEROL  IF NEEDED  NEXIUM IF NEEDED    Continue taking all medications reviewed with the preadmission nurse EXCEPT follow the instructions below.  Hold or reduce the dosage of the following medication, as discussed:     Hold Viagra/Sildenafil for 24 hours prior to procedure.  LAST DOSE 01/31/24  Hold Cialis/Tadalafil for 3 days prior to procedure.  LAST DOSE 01/29/24      Stop all supplements, vitamins and herbal remedies one week prior to your procedure.  LAST DOSE 01/25/24    Do not take over the counter pain medications except plain Tylenol  or Acetaminophen  for seven days prior to your procedure unless your doctor told you otherwise.    If you are taking any diabetes and  or weight loss medications (including injectables and shots) not discussed during the call with the PreAdmission Nurse, it is important to call [phone] to discuss important instructions.    If you are taking blood thinners, and have not been told when to hold the dose, call the doctor performing your procedure for instructions on when to stop.    On the day of your procedure, a nurse will review your medications and ask when you last took each one.        Procedure Preparation    Diet Restrictions:Studies show carbohydrate drinks before procedures are beneficial.  For this reason, Anesthesia asks that you drink 8 oz of non-red Gatorade (sugar free if diabetic) at 6pm and again before midnight the day before your procedure.  If your surgeon instructs you to drink more than 8oz of Gatorade, it's fine to do so.  No food or drink including gum or mints -after midnight the day of your procedure.      If you have questions on the day of your procedure call [facility] at [phone].    Skin Preparation:  Wash with Hibiclens or an antibacterial soap (e.g. Dial soap) the night before and morning of procedure.    Do not put on any deodorants, lotions, powders, or oils on the day of procedure.  Be sure to put on clean, comfortable loose fitting clothing.    Other Preparation:  Call your doctor right away if you  get any wounds, cuts, scrapes, scabs, rashes, bug bites at or near your procedure site or if you have any fever, cold or flu symptoms    No test required before your procedure.      If you have any scheduling concerns or procedure questions (including questions after your procedure) please contact your physician's office. Carlus Lynwood SAUNDERS, MD  Phone Number: [phone]     Day of Procedure Patient Instruction:  Do not smoke, vape, chew tobacco, drink alcohol or use recreational drugs on the day of your procedure  Remove all jewelry, piercings, and metal accessories  Do not wear artificial  nails and only clear nail polish on natural nails. Nails must be trimmed to fingertip length to make sure the oxygen probe fits properly and to avoid injury  Do not wear artificial eye lashes because they can cause dry eyes, eye scratches, eye infections and allergic reactions  Do not use hair extensions with metal clips or hairstyles near the back of your neck, as it can make it difficult to safely manage your breathing during anesthesia  If your hair is tightly braided or styled in a complex way, it may need to be undone for your safety  Do not wear contacts, tampons, make-up, lotions, creams, powders, fragrances or deodorant  Wear loose fit clothing  Do not bring valuables or money  Bring a copy of your Living Will and/or Medical Durable Power of Attorney if you have one  Bring a list of current medications including name and dosage  Bring a picture ID and insurance card     Bring your inhaler(s)  Bring a walker or other orthopedic device necessary for postop  Bring a storage case for eyeglasses, hearing aids, dentures, etc  Wear a loose button-up shirt  Refer to the doctor's office for day of procedure recommended clothing          If you are going home the same day as your procedure, a support person should accompany you to the facility and must transport you home.  If you plan to take public transportation of any sort, your support person must accompany you home.  You should have someone to stay with you for 24 hours after your procedure with sedation of any kind.       Comments:     This information was reviewed with you during your Pre-Admission Testing interview and you verbalized understanding. If you have any additional questions please contact [phone].    To pre-register for your procedure OR for billing estimate questions please call [phone] Option 2 and then Option 3.    For financial questions AFTER your procedure is complete please contact [phone].    For financial questions regarding  anesthesia at a [facility] facility, please  contact [phone].    For MyChart Patient Portal help please call [phone].    For hotel accommodations please visit [email or web address] and select PATIENTS &VISITORS -> "FOR VISITORS" section and then TRAVEL INFORMATION -> PLACES TO STAY

## 2024-01-27 ENCOUNTER — Encounter: Attending: Sports Medicine

## 2024-01-27 NOTE — Telephone Encounter (Signed)
 Informed pt we need clearance form by next Monday. He will call pcp

## 2024-02-01 NOTE — Telephone Encounter (Signed)
 Spoke with pt again. He contacted PCP but has not heard back. I called his PCP again to get fax. Gave email

## 2024-02-01 NOTE — Telephone Encounter (Signed)
 Contacted [facility] to request clearance form to be faxed. Informed pt and he will contact as well

## 2024-02-02 ENCOUNTER — Inpatient Hospital Stay: Payer: Medicare (Managed Care)

## 2024-02-02 MED ORDER — DEXTROSE 10 % IV BOLUS
INTRAVENOUS | Status: DC | PRN
Start: 2024-02-02 — End: 2024-02-02

## 2024-02-02 MED ORDER — OXYCODONE HCL 5 MG PO TABS
5 | ORAL | Status: DC | PRN
Start: 2024-02-02 — End: 2024-02-02

## 2024-02-02 MED ORDER — LIDOCAINE HCL (PF) 2 % IJ SOLN
2 | INTRAMUSCULAR | Status: AC
Start: 2024-02-02 — End: ?

## 2024-02-02 MED ORDER — CLINDAMYCIN PHOSPHATE IN D5W 900 MG/50ML IV SOLN
900 | Freq: Once | INTRAVENOUS | Status: AC
Start: 2024-02-02 — End: 2024-02-02
  Administered 2024-02-02: 19:00:00 900 mg via INTRAVENOUS

## 2024-02-02 MED ORDER — SUGAMMADEX SODIUM 200 MG/2ML IV SOLN
200 | INTRAVENOUS | Status: AC
Start: 2024-02-02 — End: ?

## 2024-02-02 MED ORDER — EPINEPHRINE (ANAPHYLAXIS) 30 MG/30ML IJ SOLN
30 | INTRAMUSCULAR | Status: DC | PRN
Start: 2024-02-02 — End: 2024-02-02
  Administered 2024-02-02: 19:00:00 .3

## 2024-02-02 MED ORDER — NORMAL SALINE FLUSH 0.9 % IV SOLN
0.9 | INTRAVENOUS | Status: DC | PRN
Start: 2024-02-02 — End: 2024-02-02

## 2024-02-02 MED ORDER — GLUCOSE 4 G PO CHEW
4 | ORAL | Status: DC | PRN
Start: 2024-02-02 — End: 2024-02-02

## 2024-02-02 MED ORDER — ROCURONIUM BROMIDE 50 MG/5ML IV SOLN
50 | Freq: Once | INTRAVENOUS | Status: DC | PRN
Start: 2024-02-02 — End: 2024-02-02
  Administered 2024-02-02: 19:00:00 5 via INTRAVENOUS
  Administered 2024-02-02: 19:00:00 10 via INTRAVENOUS
  Administered 2024-02-02: 19:00:00 25 via INTRAVENOUS
  Administered 2024-02-02: 20:00:00 10 via INTRAVENOUS
  Administered 2024-02-02: 20:00:00 5 via INTRAVENOUS

## 2024-02-02 MED ORDER — HYDROMORPHONE HCL 1 MG/ML IJ SOLN
1 | INTRAMUSCULAR | Status: DC | PRN
Start: 2024-02-02 — End: 2024-02-02

## 2024-02-02 MED ORDER — ROPIVACAINE HCL 5 MG/ML IJ SOLN
INTRAMUSCULAR | Status: AC
Start: 2024-02-02 — End: ?

## 2024-02-02 MED ORDER — PROPOFOL 200 MG/20ML IV EMUL
200 | Freq: Once | INTRAVENOUS | Status: DC | PRN
Start: 2024-02-02 — End: 2024-02-02
  Administered 2024-02-02: 19:00:00 180 via INTRAVENOUS

## 2024-02-02 MED ORDER — ONDANSETRON HCL 4 MG/2ML IJ SOLN
4 | Freq: Once | INTRAMUSCULAR | Status: DC | PRN
Start: 2024-02-02 — End: 2024-02-02

## 2024-02-02 MED ORDER — EPHEDRINE SULFATE (PRESSORS) 50 MG/ML IV SOLN
50 | Freq: Once | INTRAVENOUS | Status: DC | PRN
Start: 2024-02-02 — End: 2024-02-02
  Administered 2024-02-02 (×2): 10 via INTRAVENOUS

## 2024-02-02 MED ORDER — SODIUM CHLORIDE 0.9 % IV SOLN
0.9 | INTRAVENOUS | Status: DC | PRN
Start: 2024-02-02 — End: 2024-02-02

## 2024-02-02 MED ORDER — ROPIVACAINE HCL 2 MG/ML IJ SOLN
Freq: Once | INTRAMUSCULAR | Status: AC | PRN
Start: 2024-02-02 — End: 2024-02-02
  Administered 2024-02-02 (×2): 20 via PERINEURAL

## 2024-02-02 MED ORDER — PHENYLEPHRINE HCL 1 MG/10ML IV SOSY
1 | Freq: Once | INTRAVENOUS | Status: DC | PRN
Start: 2024-02-02 — End: 2024-02-02
  Administered 2024-02-02 (×2): 100 via INTRAVENOUS

## 2024-02-02 MED ORDER — NORMAL SALINE FLUSH 0.9 % IV SOLN
0.9 | Freq: Two times a day (BID) | INTRAVENOUS | Status: DC
Start: 2024-02-02 — End: 2024-02-02

## 2024-02-02 MED ORDER — GLUCAGON EMERGENCY 1 MG/ML IJ SOLR
1 | INTRAMUSCULAR | Status: DC | PRN
Start: 2024-02-02 — End: 2024-02-02

## 2024-02-02 MED ORDER — LIDOCAINE HCL 2 % IJ SOLN
2 | Freq: Once | INTRAMUSCULAR | Status: DC | PRN
Start: 2024-02-02 — End: 2024-02-02
  Administered 2024-02-02: 19:00:00 100 via INTRAVENOUS

## 2024-02-02 MED ORDER — MIDAZOLAM HCL 2 MG/2ML IJ SOLN
2 | INTRAMUSCULAR | Status: AC
Start: 2024-02-02 — End: 2024-02-02

## 2024-02-02 MED ORDER — ROPIVACAINE ELASTOMERIC INFUSION 0.2%
Status: DC
Start: 2024-02-02 — End: 2024-02-02

## 2024-02-02 MED ORDER — HALOPERIDOL LACTATE 5 MG/ML IJ SOLN
5 | Freq: Once | INTRAMUSCULAR | Status: DC | PRN
Start: 2024-02-02 — End: 2024-02-02

## 2024-02-02 MED ORDER — FENTANYL CITRATE (PF) 100 MCG/2ML IJ SOLN
100 | Freq: Once | INTRAMUSCULAR | Status: AC | PRN
Start: 2024-02-02 — End: 2024-02-02
  Administered 2024-02-02: 17:00:00 100 via INTRAVENOUS

## 2024-02-02 MED ORDER — FENTANYL CITRATE (PF) 100 MCG/2ML IJ SOLN
100 | INTRAMUSCULAR | Status: AC
Start: 2024-02-02 — End: 2024-02-02

## 2024-02-02 MED ORDER — ACETAMINOPHEN 500 MG PO TABS
500 | Freq: Once | ORAL | Status: AC
Start: 2024-02-02 — End: 2024-02-02
  Administered 2024-02-02: 16:00:00 1000 mg via ORAL

## 2024-02-02 MED ORDER — ROPIVACAINE HCL 5 MG/ML IJ SOLN
INTRAMUSCULAR | Status: DC | PRN
Start: 2024-02-02 — End: 2024-02-02
  Administered 2024-02-02: 19:00:00 5

## 2024-02-02 MED ORDER — EPINEPHRINE (ANAPHYLAXIS) 30 MG/30ML IJ SOLN
30 | INTRAMUSCULAR | Status: AC
Start: 2024-02-02 — End: ?

## 2024-02-02 MED ORDER — PHENYLEPHRINE HCL 1 MG/10ML IV SOSY
1 | INTRAVENOUS | Status: AC
Start: 2024-02-02 — End: ?

## 2024-02-02 MED ORDER — SODIUM CHLORIDE (PF) 0.9 % IJ SOLN
0.9 | INTRAMUSCULAR | Status: DC | PRN
Start: 2024-02-02 — End: 2024-02-02

## 2024-02-02 MED ORDER — CELECOXIB 100 MG PO CAPS
100 | Freq: Once | ORAL | Status: AC
Start: 2024-02-02 — End: 2024-02-02
  Administered 2024-02-02: 16:00:00 100 mg via ORAL

## 2024-02-02 MED ORDER — KETOROLAC TROMETHAMINE 30 MG/ML IJ SOLN
30 | Freq: Four times a day (QID) | INTRAMUSCULAR | Status: DC | PRN
Start: 2024-02-02 — End: 2024-02-02

## 2024-02-02 MED ORDER — LIDOCAINE-EPINEPHRINE 1 %-1:100000 IJ SOLN
1 | INTRAMUSCULAR | Status: DC | PRN
Start: 2024-02-02 — End: 2024-02-02
  Administered 2024-02-02: 19:00:00 20

## 2024-02-02 MED ORDER — ONDANSETRON HCL 4 MG/2ML IJ SOLN
4 | Freq: Once | INTRAMUSCULAR | Status: DC | PRN
Start: 2024-02-02 — End: 2024-02-02
  Administered 2024-02-02: 20:00:00 4 via INTRAVENOUS

## 2024-02-02 MED ORDER — ONDANSETRON HCL 4 MG PO TABS
4 | ORAL_TABLET | Freq: Three times a day (TID) | ORAL | 0 refills | Status: AC | PRN
Start: 2024-02-02 — End: ?

## 2024-02-02 MED ORDER — SUCCINYLCHOLINE CHLORIDE 20 MG/ML IJ SOLN
20 | Freq: Once | INTRAMUSCULAR | Status: DC | PRN
Start: 2024-02-02 — End: 2024-02-02
  Administered 2024-02-02: 19:00:00 100 via INTRAVENOUS

## 2024-02-02 MED ORDER — DEXTROSE 10 % IV SOLN
10 | INTRAVENOUS | Status: DC | PRN
Start: 2024-02-02 — End: 2024-02-02

## 2024-02-02 MED ORDER — CEFAZOLIN SODIUM 2 G SOLR (MIXTURES ONLY)
2 | INTRAVENOUS | Status: DC
Start: 2024-02-02 — End: 2024-02-02

## 2024-02-02 MED ORDER — OXYCODONE HCL 5 MG PO TABS
5 | ORAL_TABLET | Freq: Three times a day (TID) | ORAL | 0 refills | Status: AC | PRN
Start: 2024-02-02 — End: 2024-02-07

## 2024-02-02 MED ORDER — SUCCINYLCHOLINE CHLORIDE 20 MG/ML IJ SOLN
20 | INTRAMUSCULAR | Status: AC
Start: 2024-02-02 — End: ?

## 2024-02-02 MED ORDER — ROCURONIUM BROMIDE 50 MG/5ML IV SOLN
50 | INTRAVENOUS | Status: AC
Start: 2024-02-02 — End: ?

## 2024-02-02 MED ORDER — ONDANSETRON HCL 4 MG/2ML IJ SOLN
4 | INTRAMUSCULAR | Status: AC
Start: 2024-02-02 — End: ?

## 2024-02-02 MED ORDER — PROPOFOL 200 MG/20ML IV EMUL
200 | INTRAVENOUS | Status: AC
Start: 2024-02-02 — End: ?

## 2024-02-02 MED ORDER — MIDAZOLAM HCL 2 MG/2ML IJ SOLN
2 | Freq: Once | INTRAMUSCULAR | Status: AC | PRN
Start: 2024-02-02 — End: 2024-02-02
  Administered 2024-02-02: 17:00:00 2 via INTRAVENOUS

## 2024-02-02 MED ORDER — ROPIVACAINE HCL 2 MG/ML IJ SOLN
INTRAMUSCULAR | Status: AC
Start: 2024-02-02 — End: 2024-02-02
  Administered 2024-02-02: 21:00:00 500

## 2024-02-02 MED ORDER — LACTATED RINGERS IV SOLN
INTRAVENOUS | Status: DC
Start: 2024-02-02 — End: 2024-02-02
  Administered 2024-02-02: 16:00:00 via INTRAVENOUS

## 2024-02-02 MED ORDER — EPHEDRINE SULFATE (PRESSORS) 50 MG/ML IV SOLN
50 | INTRAVENOUS | Status: AC
Start: 2024-02-02 — End: ?

## 2024-02-02 MED ORDER — DEXAMETHASONE SODIUM PHOSPHATE 4 MG/ML IJ SOLN
4 | Freq: Once | INTRAMUSCULAR | Status: DC | PRN
Start: 2024-02-02 — End: 2024-02-02
  Administered 2024-02-02: 19:00:00 4 via INTRAVENOUS

## 2024-02-02 MED ORDER — ACETAMINOPHEN 325 MG PO TABS
325 | ORAL | Status: DC | PRN
Start: 2024-02-02 — End: 2024-02-02

## 2024-02-02 MED ORDER — LIDOCAINE-EPINEPHRINE 1 %-1:100000 IJ SOLN
1 | INTRAMUSCULAR | Status: AC
Start: 2024-02-02 — End: ?

## 2024-02-02 MED ORDER — LACTATED RINGERS IV SOLN
INTRAVENOUS | Status: DC | PRN
Start: 2024-02-02 — End: 2024-02-02
  Administered 2024-02-02: 19:00:00 via INTRAVENOUS

## 2024-02-02 MED ORDER — CLINDAMYCIN PHOSPHATE IN D5W 900 MG/50ML IV SOLN
900 | INTRAVENOUS | Status: AC
Start: 2024-02-02 — End: 2024-02-02

## 2024-02-02 MED ORDER — HYDROMORPHONE HCL 1 MG/ML IJ SOLN
1 | INTRAMUSCULAR | Status: DC | PRN
Start: 2024-02-02 — End: 2024-02-02
  Administered 2024-02-02: 20:00:00 0.5 mg via INTRAVENOUS

## 2024-02-02 MED FILL — ANECTINE 20 MG/ML IJ SOLN: 20 mg/mL | INTRAMUSCULAR | Qty: 10

## 2024-02-02 MED FILL — FENTANYL CITRATE (PF) 100 MCG/2ML IJ SOLN: 100 MCG/2ML | INTRAMUSCULAR | Qty: 2

## 2024-02-02 MED FILL — DILAUDID 1 MG/ML IJ SOLN: 1 mg/mL | INTRAMUSCULAR | Qty: 0.5

## 2024-02-02 MED FILL — PROPOFOL 200 MG/20ML IV EMUL: 200 MG/20ML | INTRAVENOUS | Qty: 20

## 2024-02-02 MED FILL — BRIDION 200 MG/2ML IV SOLN: 200 MG/2ML | INTRAVENOUS | Qty: 2

## 2024-02-02 MED FILL — EPINEPHRINE (ANAPHYLAXIS) 30 MG/30ML IJ SOLN: 30 mg/mL | INTRAMUSCULAR | Qty: 30

## 2024-02-02 MED FILL — CELECOXIB 100 MG PO CAPS: 100 mg | ORAL | Qty: 1

## 2024-02-02 MED FILL — ROCURONIUM BROMIDE 50 MG/5ML IV SOLN: 50 MG/5ML | INTRAVENOUS | Qty: 5

## 2024-02-02 MED FILL — ROPIVACAINE HCL 2 MG/ML IJ SOLN: INTRAMUSCULAR | Qty: 500

## 2024-02-02 MED FILL — ONDANSETRON HCL 4 MG/2ML IJ SOLN: 4 MG/2ML | INTRAMUSCULAR | Qty: 2

## 2024-02-02 MED FILL — CLINDAMYCIN PHOSPHATE IN D5W 900 MG/50ML IV SOLN: 900 MG/50ML | INTRAVENOUS | Qty: 50

## 2024-02-02 MED FILL — MIDAZOLAM HCL 2 MG/2ML IJ SOLN: 2 mg/mL | INTRAMUSCULAR | Qty: 2

## 2024-02-02 MED FILL — PHENYLEPHRINE HCL (PRESSORS) 1 MG/10ML IV SOSY: 1 MG/0ML | INTRAVENOUS | Qty: 10

## 2024-02-02 MED FILL — EPHEDRINE SULFATE (PRESSORS) 50 MG/ML IV SOLN: 50 mg/mL | INTRAVENOUS | Qty: 1

## 2024-02-02 MED FILL — CEFAZOLIN SODIUM 2 G IJ SOLR: 2 g | INTRAMUSCULAR | Qty: 2000

## 2024-02-02 MED FILL — TYLENOL EXTRA STRENGTH 500 MG PO TABS: 500 mg | ORAL | Qty: 2

## 2024-02-02 MED FILL — LIDOCAINE-EPINEPHRINE 1 %-1:100000 IJ SOLN: 1 %-:00000 | INTRAMUSCULAR | Qty: 20

## 2024-02-02 MED FILL — NAROPIN 5 MG/ML IJ SOLN: 5 mg/mL | INTRAMUSCULAR | Qty: 30

## 2024-02-02 MED FILL — LIDOCAINE HCL (PF) 2 % IJ SOLN: 2 % | INTRAMUSCULAR | Qty: 5

## 2024-02-02 NOTE — Anesthesia Post-Procedure Evaluation (Signed)
 Department of Anesthesiology  Postprocedure Note    Patient: Dominic VANGIESON Sr.  MRN: [id number]  Birthdate: 1956-06-03  Date of evaluation: 02/02/2024    Procedure Summary       Date: 02/02/24 Room / Location: [facility] OR[facility] / [facility]    Anesthesia Start: 1432 Anesthesia Stop: 1631    Procedure: LEFT SHOULDER ARTHROSCOPY-BICEPS TENODESIS-DCE-SAD (Left: Shoulder) Diagnosis:       Dysfunction of left rotator cuff      (Dysfunction of left rotator cuff [F32.087])    Surgeons: Carlus Agent SAUNDERS, MD Responsible Provider: Justino Camellia Sellers, MD    Anesthesia Type: General ASA Status: 3            Anesthesia Type: General    Aldrete Phase I: Aldrete Score: 10    Aldrete Phase II:      Anesthesia Post Evaluation    Patient location during evaluation: PACU  Patient participation: complete - patient participated  Level of consciousness: sleepy but conscious  Airway patency: patent  Nausea & Vomiting: no vomiting and no nausea  Cardiovascular status: hemodynamically stable  Respiratory status: acceptable, face mask and spontaneous ventilation  Hydration status: stable  Multimodal analgesia pain management approach  Pain management: adequate    No notable events documented.

## 2024-02-02 NOTE — Discharge Instructions (Signed)
 SHOULDER ARTHROSCOPY--POSTOPERATIVE INSTRUCTIONS ROTATOR CUFF  Pain Medication:  Tylenol 1000 mg by mouth 3 times a day for 5 days.  Do not exceed more than 4000 mg of Tylenol/acetaminophen/APAP  from all medications used in one day.  Over the counter anti-inflammatory medication such as Advil/ibuprofen (max dose 2400mg /day) OR Aleve/naproxen (max dose 1100 mg/day) in addition to Tylenol, if needed and tolerated  Most patients do this regimen for the first 3-4 days: Tylenol 1000mg , 4 hours later Ibuprofen 800mg , 4 hours later Tylenol 1000mg , 4 hours later Ibuprofen 800mg , and so forth. You may modify this as you see fit.  Use narcotic pain medications only for severe breakthrough pain that is not controlled by ice, Tylenol, and ibuprofen or naproxen.    Pain Pump:  To start the infusion of the pain, please unclamp the white clamp at  _________ am/pm (approximately 8 hours after nerve block was done)   The pump will infuse at a preset rate and cannot be turned up or down.  To turn the pump off, close the white clamp.  Expect some clear and blood tinged drainage from the incisions and catheter site.  This is normal. Reinforce with extra bandages as needed.  Call if signs of allergic reaction i.e.  Skin rash or hives, tongue numbness or tingling, ringing in the ears, blurred vision.  Close white clamp to stop infusion  The pain pump is to be removed in 72 hours. At that point, remove the dressings except for Steri-Strips over the incision. A Band-Aid may be applied if needed after the pump comes out.   Remove catheter-wash hands, remove dressings over catheter and slowly remove the catheter with a gentle and prolonged pull.  Once the pain pump has been removed and the dressings are off, you may get the incisions wet in the shower    Icing:  Thermacare for ice therapy as directed.  May turn off compression as desired  OR  Ice pack/bag to shoulder every 30 minutes every few hours while awake  Sling:  Wear sling at all  times except when doing below exercises  Wear shower sling while showering (this can be an inexpensive sling from drugstore that you can get wet).  Hang dry shower sling in between uses.  Sleep in the sling  Use squeeze ball throughout the day    Follow Up Appointment:  Call for appointment in 5-7 days if not already scheduled.  Office [place] [phone]  Office [place] [phone]  Call office for temperature greater than 102 degrees Fahrenheit and for excessive swelling, pain, or redness around incision site        POST-OPERATIVE SHOULDER EXERCISES    PERFORM BELOW EXERCISES 1 MINUTE EACH 4X A DAY    1. You may remove the shoulder sling to perform the shoulder pendulum exercises.    2. You may also remove the shoulder sling to begin flexion and extension of the elbow and wrist.    3. Utilize a squeeze ball in your hand for active motion to minimize swelling.    4. DO NOT actively elevate or flex shoulder until advised.

## 2024-02-02 NOTE — Anesthesia Pre-Procedure Evaluation (Signed)
 Department of Anesthesiology  Preprocedure Note       Name:  Dominic Knight.   Age:  68 y.o.  DOB:  1956/05/18                                          MRN:  [id number]         Date:  02/02/2024      Surgeon: Clotilde):  Demarco, Lynwood SAUNDERS, MD    Procedure: Procedure(s):  LEFT SHOULDER ARTHROSCOPY ROTATOR CUFF REPAIR    Medications prior to admission:   Prior to Admission medications    Medication Sig Start Date End Date Taking? Authorizing Provider   traZODone (DESYREL) 50 MG tablet Take 2 tablets by mouth nightly   Yes [provider]   metoprolol succinate (TOPROL XL) 25 MG extended release tablet Take 1 tablet by mouth daily   Yes [provider]   chlorthalidone (HYGROTON) 25 MG tablet Take 1 tablet by mouth daily   Yes [provider]   cetirizine (ZYRTEC) 10 MG tablet Take 1 tablet by mouth daily 07/04/19  Yes [provider]   amLODIPine (NORVASC) 10 MG tablet Take 0.5 tablets by mouth daily 08/07/22  Yes [provider]   oxyCODONE -acetaminophen  (PERCOCET ) 10-325 MG per tablet Take 1 tablet by mouth every 8 hours as needed for Pain.   Yes [facility], [facility], MD   albuterol  sulfate HFA (VENTOLIN  HFA) 108 (90 Base) MCG/ACT inhaler Inhale 2 puffs into the lungs 4 times daily as needed for Wheezing 01/04/23   Allinder, Nelwyn Hamilton, MD   tadalafil (CIALIS) 5 MG tablet Take 1 tablet by mouth as needed for Erectile Dysfunction 04/06/19   [provider]   sildenafil (VIAGRA) 100 MG tablet Take 1 tablet by mouth as needed for Erectile Dysfunction 01/04/04   [provider]   finasteride (PROSCAR) 5 MG tablet Take 1 tablet by mouth daily 04/28/22   [provider]   docusate (COLACE, DULCOLAX) 100 MG CAPS Take 300 mg by mouth in the morning and at bedtime 12/08/18   [provider]   senna (SENOKOT) 8.6 MG tablet Take 1 tablet by mouth 2 times daily 04/28/22   [provider]   buPROPion (WELLBUTRIN XL) 300 MG extended  release tablet Take 1 tablet by mouth every morning 04/28/22   [provider]   esomeprazole Magnesium (NEXIUM) 20 MG PACK Take 1 packet by mouth nightly as needed (INDIGESTION)    [provider]   Omega-3 Fatty Acids (FISH OIL) 1000 MG capsule Take 1 capsule by mouth 2 times daily    [provider]   GNP PAIN RELIEF EX-STRENGTH 500 MG tablet Take 1 tablet by mouth every 8 hours as needed for Pain 02/07/21   [provider]   triamcinolone (KENALOG) 0.1 % cream 0.1 g 3 times daily 02/06/21   [provider]       Current medications:    Current Facility-Administered Medications   Medication Dose Route Frequency Provider Last Rate Last Admin    sodium chloride  flush 0.9 % injection 5-40 mL  5-40 mL IntraVENous 2 times per day Wallis Smiles M, [place]        sodium chloride  flush 0.9 % injection 5-40 mL  5-40 mL IntraVENous PRN Mackorell, Christen M, PA        0.9 % sodium  chloride infusion   IntraVENous PRN Mackorell, [place], [place]        lactated ringers  infusion   IntraVENous Continuous Mackorell, Christen M, [place] 125 mL/hr at 02/02/24 1207 New Bag at 02/02/24 1207     Facility-Administered Medications Ordered in Other Encounters   Medication Dose Route Frequency Provider Last Rate Last Admin    propofol  infusion   IntraVENous Once PRN Tab Rylee Calver, MD   180 mg at 02/02/24 1442    succinylcholine  (ANECTINE ) injection   IntraVENous Once PRN Imane Burrough Calver, MD   100 mg at 02/02/24 1442    rocuronium  (ZEMURON ) injection   IntraVENous Once PRN Senetra Dillin Calver, MD   25 mg at 02/02/24 1449    lidocaine  2 % injection   IntraVENous Once PRN Justino Camellia Sellers, MD   100 mg at 02/02/24 1442    dexAMETHasone  (DECADRON ) IntraVENous   IntraVENous Once PRN Tallyn Holroyd Calver, MD   4 mg at 02/02/24 1450    lactated ringers  infusion   IntraVENous Continuous PRN Justino Camellia Sellers, MD   New Bag at 02/02/24 1442       Allergies:    Allergies   Allergen Reactions     Cefdinir Other (See Comments)     Pt does not recall his reaction.    Gabapentin Anxiety and Hallucinations    Prochlorperazine Anxiety and Other (See Comments)     Event:    Dizziness  REACTION: Flighty  compazine         Problem List:    Patient Active Problem List   Diagnosis Code    Chronic narcotic use F11.90    Full thickness tear of right subscapularis tendon S46.811A    Knee pain M25.569    Nontraumatic incomplete tear of right rotator cuff M75.111    Pain in right shoulder M25.511    Tear of medial meniscus of knee joint S83.249A    Primary osteoarthritis of left knee M17.12    Dysfunction of left rotator cuff M67.912    Orthopedic aftercare Z47.89    Cervicalgia M54.2    Essential hypertension I10       Past Medical History:        Diagnosis Date    Acid reflux     Anxiety     Claustrophobia     Depression     HBP (high blood pressure)     High cholesterol     History of asthma     Rotator cuff tear, left     Seasonal allergies     Shoulder pain     Wears glasses        Past Surgical History:        Procedure Laterality Date    CERVICAL SPINE SURGERY      x2    COLONOSCOPY      KNEE ARTHROSCOPY Right     x2    KNEE ARTHROSCOPY Left 01/07/2022    LEFT KNEE ARTHROSCOPY WITH PARTIAL MEDIAL MENISECTOMY, CHONDROPLASTY, (PRP) PLATELET RICH PLASMA INJECTION performed by Elsie JINNY Don, MD at [facility] MAIN OR    LUMBAR SPINE SURGERY      x3    SHOULDER ARTHROSCOPY Right 07/07/2019       Social History:    Social History     Tobacco Use    Smoking status: Former     Current packs/day: 0.00     Average packs/day: 0.3 packs/day for 28.0 years (7.0 ttl pk-yrs)  Types: Cigarettes     Start date: 45     Quit date: 2000     Years since quitting: 25.5    Smokeless tobacco: Never   Substance Use Topics    Alcohol use: Yes     Alcohol/week: 1.0 standard drink of alcohol     Types: 1 Glasses of wine per week                                Counseling given: Not Answered      Vital Signs (Current):   Vitals:    01/19/24  0840 02/02/24 1150   BP:  133/64   Pulse:  84   Resp:  18   Temp:  99.5 F (37.5 C)   TempSrc:  Oral   SpO2:  97%   Weight: 103.4 kg (228 lb)    Height: 1.88 m (6' 2)                                               BP Readings from Last 3 Encounters:   02/02/24 133/64   03/13/23 (!) 152/75   01/04/23 (!) 166/73       NPO Status: Time of last liquid consumption: 2330                        Time of last solid consumption: 2330                        Date of last liquid consumption: 02/01/24                        Date of last solid food consumption: 02/01/24    BMI:   Wt Readings from Last 3 Encounters:   01/19/24 103.4 kg (228 lb)   03/13/23 100.7 kg (222 lb)   01/04/23 98.4 kg (217 lb)     Body mass index is 29.27 kg/m.    CBC:   Lab Results   Component Value Date/Time    WBC 4.5 01/04/2023 02:48 PM    RBC 4.71 01/04/2023 02:48 PM    HGB 14.2 01/04/2023 02:48 PM    HCT 43.7 01/04/2023 02:48 PM    MCV 92.8 01/04/2023 02:48 PM    RDW 13.0 01/04/2023 02:48 PM    PLT 185 01/04/2023 02:48 PM       CMP:   Lab Results   Component Value Date/Time    NA 136 01/04/2023 02:48 PM    K 3.6 01/04/2023 02:48 PM    CL 96 01/04/2023 02:48 PM    CO2 28 01/04/2023 02:48 PM    BUN 10 01/04/2023 02:48 PM    CREATININE 1.0 01/04/2023 02:48 PM    LABGLOM 82 01/04/2023 02:48 PM    GLUCOSE 103 01/04/2023 02:48 PM    CALCIUM 10.3 01/04/2023 02:48 PM       POC Tests: No results for input(s): POCGLU, POCNA, POCK, POCCL, POCBUN, POCHEMO, POCHCT in the last 72 hours.    Coags: No results found for: PROTIME, INR, APTT    HCG (If Applicable): No results found for: PREGTESTUR, PREGSERUM, HCG, HCGQUANT     ABGs: No results found for: PHART, PO2ART, PCO2ART, HCO3ART, BEART, O2SATART  Type & Screen (If Applicable):  No results found for: ABORH, LABANTI    Drug/Infectious Status (If Applicable):  No results found for: HIV, HEPCAB    COVID-19 Screening (If Applicable): No results found for:  COVID19        Anesthesia Evaluation  Patient summary reviewed and Nursing notes reviewed   no history of anesthetic complications:   Airway: Mallampati: II  TM distance: >3 FB   Neck ROM: full  Mouth opening: > = 3 FB   Dental: normal exam         Pulmonary:Negative Pulmonary ROS       (-) wheezes and stridor                           Cardiovascular:    (+) hypertension:        Rhythm: regular  Rate: normal                    Neuro/Psych:   (+) psychiatric history:   (-) seizures, TIA and CVA           GI/Hepatic/Renal:   (+) GERD:          Endo/Other:                     Abdominal:   (+) obese          Vascular:          Other Findings:             Anesthesia Plan      general     ASA 3       Induction: intravenous.    MIPS: Postoperative opioids intended and Prophylactic antiemetics administered.  Anesthetic plan and risks discussed with patient.              Post-op pain plan if not by surgeon: single peripheral nerve block            Camellia Melba Canny, MD   02/02/2024

## 2024-02-02 NOTE — Anesthesia Procedure Notes (Signed)
 Peripheral Block    Patient location during procedure: pre-op  Reason for block: procedure for pain, post-op pain management and at surgeon's request  Start time: 02/02/2024 12:30 PM  Staffing  Performed: anesthesiologist   Anesthesiologist: Ezella Donnice Rush, MD  Performed by: Ezella Donnice Rush, MD  Authorized by: Ezella Donnice Rush, MD    Preanesthetic Checklist  Completed: patient identified, IV checked, site marked, risks and benefits discussed, surgical/procedural consents, equipment checked, pre-op evaluation, timeout performed, anesthesia consent given, oxygen available and monitors applied/VS acknowledged  Peripheral Block   Patient position: supine  Prep: ChloraPrep  Provider prep: mask  Patient monitoring: continuous pulse ox, IV access, oxygen and responsive to questions  Block type: Brachial plexus  Interscalene  Laterality: left  Injection technique: single-shot  Guidance: ultrasound guided    Needle   Needle type: short-bevel   Needle gauge: 20 G  Needle localization: anatomical landmarks and ultrasound guidance  Needle length: 8 cm  Assessment   Injection assessment: negative aspiration for heme, no paresthesia on injection, local visualized surrounding nerve on ultrasound and no intravascular symptoms  Paresthesia pain: none  Slow fractionated injection: yes  Hemodynamics: stable  Outcomes: uncomplicated and patient tolerated procedure well    Additional Notes  The surgeon has consulted the department of regional anesthesia to place a peripheral nerve block for post-operative pain management for this patient. If a PNB catheter is placed, infusion is anticipated for up to 72 hours post-operatively unless otherwise discussed.     Prior to the above procedure, the alternatives, risks, side effects, and potential complications (including anesthetic toxicity, seizures, death, temporary and/or permanent nerve injury, and loss of extremity function) of the planned single injection and/or  continuous peripheral nerve block. All were thoroughly discussed, and the patient and/or guardian acknowledge and accept all risks of the planned procedure.    Block completed under direct U/S guidance. Frequent negative aspiration during injection. No pain on injection, low injection pressure, no paresthesia, no evidence of hematoma, and no other complications unless otherwise noted.    PERFORMED BLOCKS:  INTERSCALENE PERIPHERAL NERVE BLOCK  AND  OTHER NERVE BLOCK [64450]  Medications Administered  fentaNYL  (SUBLIMAZE ) injection - IntraVENous   100 mcg - 02/02/2024 12:30:00 PM  midazolam  (VERSED ) injection 2 mg/32mL - IntraVENous   2 mg - 02/02/2024 12:30:00 PM  ropivacaine  (NAROPIN ) injection 0.2% - Perineural   20 mL - 02/02/2024 12:30:00 PM   20 mL - 02/02/2024 12:31:00 PM

## 2024-02-02 NOTE — Op Note (Signed)
 Date of service  February 02, 2024    Preoperative diagnosis  Left shoulder pain       Postoperative diagnosis  Left shoulder intra-articular synovitis anteriorly, posterior, inferiorly and superiorly  Left shoulder global labral fraying with type II SLAP tear  Left shoulder partial undersurface supraspinatus rotator cuff tear approximately 25%  Left shoulder mild to moderate glenohumeral arthritis  Left shoulder subacromial impingement syndrome  Left shoulder biceps tendinopathy  Left shoulder acromioclavicular osteoarthrosis       Procedure  Left shoulder subacromial decompression CPT code 70173  Left shoulder debridement of synovitis anteriorly complexly, inferiorly and superiorly CPT code 70176  Left shoulder debridement of undersurface rotator cuff tear with bone marrow stimulation CPT code 70176   Left shoulder debridement of articular cartilage and release of middle glenohumeral ligament and anterior capsular tissue CPT code 70176  Left shoulder biceps tenodesis CPT code 70171  Left shoulder distal clavicle excision CPT code 70175       Surgeon  Dr. Lynwood Dec       Assistant  Janita Spire PA-C       Anesthesia  Gen. with interscalene block       Anesthesiologist  Dr. Camellia Canny       Estimated blood loss  None       Specimen  None       Complications  None       Indication  This is a 68 year old male who suffers long-standing pain in the left shoulder that has been unresponsive to conservative management. MRI shows subacromial impingement, acromioclavicular pathology, and biceps tendinopathy. The patient understands the risks and benefits of surgery and has signed the consent and wishes to proceed.       Findings  Exam under anesthesia showed full range of motion without adhesions or instability.  The rotator cuff showed anterior partial undersurface fraying supraspinatus.  Biceps tendon showed severe tendinopathy.  Humeral head showed areas of grade 2 and 3 changes approximately 25%  Glenoid chondral  surface showed areas of grade 2 and 3 changes approximately 50%  Labrum showed type II SLAP tear.  Rotator Interval showed thickening of the rotator interval and middle glenohumeral ligament  Capsular structures showed synovitis anteriorly complexly, superiorly and inferiorly  Subacromial bursal sac showed moderate thickening.  Acromion was a Type 3.  AC joint showed severe osteoarthrosis.       Implants  Arthrex proximal biceps button with #2 FiberWire       Procedure  My physician assistant Janita Spire, PA-C was medically necessary for successful implementation and completion of this case. She was involved with the entirety of the case including assessment and intake of the patient in the holding room, safe transfer of the patient to the operating room table. She was utilized and assisted in prepping and draping. But most importantly she performed arthroscopic procedures that are not typical of an untrained surgical assistant. She assisted and manipulated the arthroscope as well as arthroscopic graspers, biters and suture passers allowing me to do my portion of the surgery arthroscopically with more efficiency. She was also involved with closure of the incisions, dressings and safe transfer of the patient to the holding area. Because of her assistance, the surgical case progressed more efficiently and the patient was exposed to less general anesthesia, less bleeding and less time of having open incisions.  The patient was brought to the operating room and induced with general anesthesia after receiving an interscalene block. IV antibiotics were given and  DVT prophylaxis was applied to the legs. A timeout was called and confirmed. Exam under anesthesia was performed. The patient's arm was then double prepped and draped in the usual sterile manner. Standard anterior and posterior portals were established into the joint space. The joint was examined systematically and the findings are noted above. Debridement  was carried out in the joint with a combination of a 5.0 mm shaver and a 90 degree ArthroCare wand. The biceps was examined, as well as its attachment to the superior labrum.  Complete debridement was carried out of the synovitis and labral tissue anteriorly, posterior, superiorly and inferiorly.  We also did a debridement of the undersurface of the supraspinatus where was frayed and partially torn.  We then performed healing bone trough at the base of supraspinatus release blood and bone marrow elements.  We then debrided articular cartilage that was loose and debris that was in the joint.  Once completed within the joint we went into the subacromial space. Debridement of the bursa was carried out with the shaver and ArthroCare wand.  The biceps was dissected out and located in the bicipital groove from the bursal side.  A marking suture was utilized to mark the appropriate amount of tension prior to tenotomizing it off the superior labrum. We then went in the subacromial space once again and dissected the biceps out of the groove by unroofing the transverse ligament. A small distal incision was utilized and the biceps was taken out and whipstitched with the fiber loop. We then brought the tendon back into the arm and then out a proximal cannula and placed the proximal biceps button on the sutures. The button was then passed into a 3.2 mm drill hole and flipped. The sutures were then passed back through the biceps tendon and tied down. Final pictures were taken.  The rotator cuff was examined. The CA ligament was subsequently taken down, and with a 5 mm burr acromioplasty was performed utilizing a posterior chamfer technique.  The acromioclavicular joint was examined and seen to be arthritic with osteophytes and loss of articular cartilage. Through a posterior portal and a direct anterior portal. The acromioclavicular joint was dissected out with the ArthroCare wand and shaver for approximately 15 mm. Utilizing a 5  mm bur we excised 10-12 mm of distal clavicle in parallel planes to the acromion in. The resection was checked in orthogonal planes to make sure that it was complete superiorly, posteriorly and anteriorly.  We irrigated out the subacromial space and placed a pain pump catheter under direct visualization into the space. We primed the catheter and closed the portals with 3-0 nylon and large Steri-Strips. The wounds were dressed sterilely and the arm was placed in a sling. We then brought the patient to the recovery room in stable condition. Sponge and needle counts were correct.

## 2024-02-02 NOTE — H&P (Signed)
 Chief Complaint:  LEFT shoulder pain     History of Present Illness   LEFT shoulder pain and weakness that has not resolved with conservative management.    No current facility-administered medications on file prior to encounter.     Current Outpatient Medications on File Prior to Encounter   Medication Sig Dispense Refill    traZODone (DESYREL) 50 MG tablet Take 2 tablets by mouth nightly      metoprolol succinate (TOPROL XL) 25 MG extended release tablet Take 1 tablet by mouth daily      chlorthalidone (HYGROTON) 25 MG tablet Take 1 tablet by mouth daily      cetirizine (ZYRTEC) 10 MG tablet Take 1 tablet by mouth daily      amLODIPine (NORVASC) 10 MG tablet Take 0.5 tablets by mouth daily      oxyCODONE -acetaminophen  (PERCOCET ) 10-325 MG per tablet Take 1 tablet by mouth every 8 hours as needed for Pain.      albuterol  sulfate HFA (VENTOLIN  HFA) 108 (90 Base) MCG/ACT inhaler Inhale 2 puffs into the lungs 4 times daily as needed for Wheezing 18 g 0    tadalafil (CIALIS) 5 MG tablet Take 1 tablet by mouth as needed for Erectile Dysfunction      sildenafil (VIAGRA) 100 MG tablet Take 1 tablet by mouth as needed for Erectile Dysfunction      finasteride (PROSCAR) 5 MG tablet Take 1 tablet by mouth daily      docusate (COLACE, DULCOLAX) 100 MG CAPS Take 300 mg by mouth in the morning and at bedtime      senna (SENOKOT) 8.6 MG tablet Take 1 tablet by mouth 2 times daily      buPROPion (WELLBUTRIN XL) 300 MG extended release tablet Take 1 tablet by mouth every morning      esomeprazole Magnesium (NEXIUM) 20 MG PACK Take 1 packet by mouth nightly as needed (INDIGESTION)      Omega-3 Fatty Acids (FISH OIL) 1000 MG capsule Take 1 capsule by mouth 2 times daily      GNP PAIN RELIEF EX-STRENGTH 500 MG tablet Take 1 tablet by mouth every 8 hours as needed for Pain      triamcinolone (KENALOG) 0.1 % cream 0.1 g 3 times daily          Allergies   Allergen Reactions    Cefdinir Other (See Comments)     Pt does not recall his  reaction.    Gabapentin Anxiety and Hallucinations    Prochlorperazine Anxiety and Other (See Comments)     Event:    Dizziness  REACTION: Flighty  compazine         Past Medical History:   Diagnosis Date    Acid reflux     Anxiety     Claustrophobia     Depression     HBP (high blood pressure)     High cholesterol     History of asthma     Rotator cuff tear, left     Seasonal allergies     Shoulder pain     Wears glasses        Past Surgical History:   Procedure Laterality Date    CERVICAL SPINE SURGERY      x2    COLONOSCOPY      KNEE ARTHROSCOPY Right     x2    KNEE ARTHROSCOPY Left 01/07/2022    LEFT KNEE ARTHROSCOPY WITH PARTIAL MEDIAL MENISECTOMY, CHONDROPLASTY, (PRP) PLATELET RICH PLASMA INJECTION performed  by Elsie JINNY Don, MD at [facility] MAIN OR    LUMBAR SPINE SURGERY      x3    SHOULDER ARTHROSCOPY Right 07/07/2019       BP 133/64   Pulse 84   Temp 99.5 F (37.5 C) (Oral)   Resp 18   Ht 1.88 m (6' 2)   Wt 103.4 kg (228 lb)   SpO2 97%   BMI 29.27 kg/m      Physical Exam     VITALS    GENERAL APPEARANCE: well developed, well nourished, Patient is alert and oriented X 3. No acute distress. Appropriate affect .   RESPIRATORY: regular, unlabored, good air movement without increased respiratory effort.   CARDIOVASCULAR: Normal rate and rhythm.   PERIPHERAL PULSES: palpable in all 4 extremities .   NEUROLOGIC: nonfocal, gait and station stable, sensory exam intact, no evidence of abnormal reflexes or long tract signs .   VASCULAR: warm extremities with good capillary refill .   PSYCH: alert, oriented, cognitive function intact, judgment and insight good, mood/affect appropriate .   UPPER EXTREMITIES: LEFT Shoulder Pain and Weakness  LOWER EXTREMITIES Bilateral lower extremities showed no effusion, ecchymosis, edema, atrophy or spasticity with range of motion that is acceptable for age     Assessment/Plan:  LEFT shoulder arthroscopy with rotator cuff repair, subacromial decompression, eval and treatment  of biceps tendon.

## 2024-02-02 NOTE — Anesthesia Procedure Notes (Signed)
 Airway  Date/Time: 02/02/2024 2:44 PM  Urgency: elective    Airway not difficult    General Information and Staff    Patient location during procedure: OR  Anesthesiologist: Justino Camellia Sellers, MD  Performed: anesthesiologist   Performed by: Justino Camellia Sellers, MD  Authorized by: Justino Camellia Sellers, MD      Indications and Patient Condition  Indications for airway management: anesthesia  Sedation level: deep  Preoxygenated: yes  Patient position: sniffing  Mask difficulty assessment: vent by bag mask + OA or adjuvant +/- NMBA    Final Airway Details  Final airway type: endotracheal airway      Successful airway: ETT  Cuffed: yes   Successful intubation technique: direct laryngoscopy  Facilitating devices/methods: intubating stylet  Endotracheal tube insertion site: oral  Blade: Macintosh  Blade size: #3  ETT size (mm): 7.5  Cormack-Lehane Classification: grade IIb - view of arytenoids or posterior of glottis only  Placement verified by: capnometry   Number of attempts at approach: 1  Ventilation between attempts: bag mask    Additional Comments  ASA monitors applied. Patient preoxygenated. IV induction. Eyes taped. Easy mask ventilation. Direct laryngoscopy, 1 attempt. Easy, atraumatic intubation with oral ETT. Positive ETCO2 detected, ETT secured. Patient positioned, head & neck supported. All pressure points padded.  no

## 2024-02-10 ENCOUNTER — Ambulatory Visit: Admit: 2024-02-10 | Discharge: 2024-02-10 | Payer: Medicare (Managed Care) | Attending: Surgical

## 2024-02-10 DIAGNOSIS — Z4789 Encounter for other orthopedic aftercare: Principal | ICD-10-CM

## 2024-02-10 NOTE — Assessment & Plan Note (Signed)
 POV1: The patient presents today for his first postop appointment following a biceps tenodesis.  We went over his pictures and the nature of his procedure together.  Because he had a biceps tendon repair he will need to be in the sling.  We discussed that for the next 3 weeks he should remain in the sling unless he is seated.  When he is seated he can function within the safe triangle.  He is aware that even within the safe trying which not be lifting anything more than 1 pound.  The other time he can come out of the sling would be to perform his home exercise program.  We had our athletic trainer demonstrate the first 5 postop exercises for him.    Pain management has been difficult for him as he is used to a standing dose of Percocet .  We discussed relying more on ice and NSAIDs.  He was encouraged to take his trazodone at night to help him sleep.  We also discussed melatonin Gummies and sleep positioning changes    Patient keep an eye on his surgical incisions.  He is aware that if he notices any erythema drainage or fever he should call the office disease to be signs of infection.    We will see him back in 3 weeks.  At that time we will start him in supervised physical therapy understand her protocol

## 2024-02-10 NOTE — Progress Notes (Signed)
 CC:   Chief Complaint   Patient presents with    Post-Op Check     Left shoulder        HPI: 12/25/2023: The patient is a retired [employer] farm manager.  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  He states that he is noticed left shoulder pain since February of this year.  He is unable to identify an injury that may have contributed to his pai, but does recall a moment whenever he internally rotated and felt a sharp searing pain.  Since then he has rated his pain as high as a 10 out of 10 with internal rotation and overhead activity he is not receiving injections or physical therapy.  He is active with housework.  01/13/24: Patient is here today for a left shoulder MRI review to determine the next course of treatment.  He received his images at [place].  No change in pain since last visit.  02/10/24: The patient presents today for his first postop appointment.  He has been compliant with the sling.  He has been struggling with pain management though he is on a standing dose of Percocet  and takes trazodone at night.        Current Outpatient Medications on File Prior to Visit   Medication Sig Dispense Refill    traZODone (DESYREL) 50 MG tablet Take 2 tablets by mouth nightly      ondansetron  (ZOFRAN ) 4 MG tablet Take 1 tablet by mouth 3 times daily as needed for Nausea or Vomiting 10 tablet 0    albuterol  sulfate HFA (VENTOLIN  HFA) 108 (90 Base) MCG/ACT inhaler Inhale 2 puffs into the lungs 4 times daily as needed for Wheezing 18 g 0    metoprolol succinate (TOPROL XL) 25 MG extended release tablet Take 1 tablet by mouth daily      chlorthalidone (HYGROTON) 25 MG tablet Take 1 tablet by mouth daily      tadalafil (CIALIS) 5 MG tablet Take 1 tablet by mouth as needed for Erectile Dysfunction      sildenafil (VIAGRA) 100 MG tablet Take 1 tablet by mouth as needed for Erectile Dysfunction      finasteride (PROSCAR) 5 MG tablet Take 1 tablet by mouth daily      cetirizine (ZYRTEC) 10 MG tablet Take 1 tablet by mouth daily       docusate (COLACE, DULCOLAX) 100 MG CAPS Take 300 mg by mouth in the morning and at bedtime      senna (SENOKOT) 8.6 MG tablet Take 1 tablet by mouth 2 times daily      buPROPion (WELLBUTRIN XL) 300 MG extended release tablet Take 1 tablet by mouth every morning      amLODIPine (NORVASC) 10 MG tablet Take 0.5 tablets by mouth daily      esomeprazole Magnesium (NEXIUM) 20 MG PACK Take 1 packet by mouth nightly as needed (INDIGESTION)      Omega-3 Fatty Acids (FISH OIL) 1000 MG capsule Take 1 capsule by mouth 2 times daily      GNP PAIN RELIEF EX-STRENGTH 500 MG tablet Take 1 tablet by mouth every 8 hours as needed for Pain      triamcinolone (KENALOG) 0.1 % cream 0.1 g 3 times daily      oxyCODONE -acetaminophen  (PERCOCET ) 10-325 MG per tablet Take 1 tablet by mouth every 8 hours as needed for Pain.       No current facility-administered medications on file prior to visit.  Allergies   Allergen Reactions    Cefdinir Other (See Comments)     Pt does not recall his reaction.    Gabapentin Anxiety and Hallucinations    Prochlorperazine Anxiety and Other (See Comments)     Event:    Dizziness  REACTION: Flighty  compazine           Past Medical History:   Diagnosis Date    Acid reflux     Anxiety     Claustrophobia     Depression     HBP (high blood pressure)     High cholesterol     History of asthma     Rotator cuff tear, left     Seasonal allergies     Shoulder pain     Wears glasses          Past Surgical History:   Procedure Laterality Date    CERVICAL SPINE SURGERY      x2    COLONOSCOPY      KNEE ARTHROSCOPY Right     x2    KNEE ARTHROSCOPY Left 01/07/2022    LEFT KNEE ARTHROSCOPY WITH PARTIAL MEDIAL MENISECTOMY, CHONDROPLASTY, (PRP) PLATELET RICH PLASMA INJECTION performed by Elsie JINNY Don, MD at [facility] MAIN OR    LUMBAR SPINE SURGERY      x3    SHOULDER ARTHROSCOPY Right 07/07/2019    SHOULDER ARTHROSCOPY Left 02/02/2024    LEFT SHOULDER ARTHROSCOPY-BICEPS TENODESIS-DCE-SAD performed by Carlus Lynwood SAUNDERS,  MD at [facility]     No surgery found No surgery found     There were no vitals taken for this visit.       Exam:  Cervical Spine                          Right/Left  Extension [1]  Flexion [1]  Axial Rotation [1]/[1]  Lateral Bending [2]/[2]  Spurlings [0]/[0]    Shoulder                            Right/ Left  Forward Flexion [140]/[105] PTE:140 on left  Positive Dawburns  ER adducted [60] / [40]  IR adducted [T10]/ [L2 p]  ER abducted [90]/ [80 p]  IR abducted [60] / [10 p]         Rotaror Cuff  IS [5]/ [4 p]  Sub [5] / [5]  SS [5] / [4 p f -]    Positive empty can test    Left shoulder postop exam: The left upper extremity is in sling.  There is patchy edema and ecchymosis but arm and soft.  Tolerates passive elbow flexion extension.  Has normal grip strength in his left hand.  He is grossly neurovascular intact.  The surgical dressing was removed which revealed well-healing incisions.  We then remove the sutures and redressed the area with Steri-Strips    MRI left shoulder January 10, 2024 [facility] reviewed by me.  Coronal cuts show significant interstitial tear supraspinatus.  It appears to be both both articular and bursal fibers are intact but 50% interstitial has fluid in it.  There is some cartilage thinning inferiorly squaring off the inferior humeral head.  Minimal signal change in the superior labrum.  Moderate AC joint changes.  Joint effusion noted which is small but present.  Sagittal cuts show no atrophy or fatty infiltration.  Intra-articular biceps shows mild widening interstitial tear confirmed  in the mid supraspinatus on sagittal cuts.  Axial cuts show intact subscapularis fluid around the rotator interval subcoracoid space, biceps within the groove.  Anterior and posterior labrum with some degenerative signal change throughout    XR CERVICAL SPINE (2-3 VIEWS)  C-spine 2 views maintenance of normal lordotic curve.  There is an ACDF   from C4-C6 been and hardware into T1.  All of the  hardware is intact there   are no lucencies facets are well-maintained uncovertebral joints show   degenerative changes with his fusions.  No acute fracture dislocation or   avulsion  XR SHOULDER LEFT (MIN 2 VIEWS)  Left shoulder 3 views shows mild narrowing of the glenohumeral joint   space.  There is a small inferior osteophyte off of the glenoid.  There is   sclerosis of the glenoid.  There is moderate sclerosis of the greater   tuberosity.  There is moderate degenerative changes acromioclavicular   joint.  No acute fracture dislocation or avulsion       Assessment:    1. Orthopedic aftercare  Overview:  Left shoulder arthroscopy with biceps tenodesis, with debridement, SAD, DCE. DOS:02/02/24    History of right shoulder rotator cuff repair 2020 by me  Assessment & Plan:   POV1: The patient presents today for his first postop appointment following a biceps tenodesis.  We went over his pictures and the nature of his procedure together.  Because he had a biceps tendon repair he will need to be in the sling.  We discussed that for the next 3 weeks he should remain in the sling unless he is seated.  When he is seated he can function within the safe triangle.  He is aware that even within the safe trying which not be lifting anything more than 1 pound.  The other time he can come out of the sling would be to perform his home exercise program.  We had our athletic trainer demonstrate the first 5 postop exercises for him.    Pain management has been difficult for him as he is used to a standing dose of Percocet .  We discussed relying more on ice and NSAIDs.  He was encouraged to take his trazodone at night to help him sleep.  We also discussed melatonin Gummies and sleep positioning changes    Patient keep an eye on his surgical incisions.  He is aware that if he notices any erythema drainage or fever he should call the office disease to be signs of infection.    We will see him back in 3 weeks.  At that time we will  start him in supervised physical therapy understand her protocol  2. Dysfunction of left rotator cuff  3. Cervicalgia  4. Essential hypertension      Return if symptoms worsen or fail to improve.

## 2024-02-22 NOTE — Telephone Encounter (Signed)
 Patient is requesting a call back he is in severe pain and would like advise    Sx was on 02/02/24    Thanks

## 2024-02-22 NOTE — Telephone Encounter (Signed)
 Spoke with Dominic Knight, Dominic Knight states he is having pain in the front of his shoulder and it goes to the top of his back. He is taking tylenol , ibuprofen and using his ice. Dr Carlus would like him to contact his pain management doctor first because we are unable to give him anything else and give us  a call back. Dominic Knight agreed

## 2024-03-02 ENCOUNTER — Ambulatory Visit: Admit: 2024-03-02 | Discharge: 2024-03-02 | Payer: Medicare (Managed Care) | Attending: Surgical

## 2024-03-02 DIAGNOSIS — Z4789 Encounter for other orthopedic aftercare: Principal | ICD-10-CM

## 2024-03-02 NOTE — Progress Notes (Signed)
 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  He states that he is noticed left shoulder pain since February of this year.  He is unable to identify an injury that may have contributed to his pai, but does recall a moment whenever he internally rotated and felt a sharp searing pain.  Since then he has rated his pain as high as a 10 out of 10 with internal rotation and overhead activity he is not receiving injections or physical therapy.  He is active with housework.  01/13/24: Patient is here today for a left shoulder MRI review to determine the next course of treatment.  He received his images at [place].  No change in pain since last visit.  02/10/24: The patient presents today for his first postop appointment.  He has been compliant with the sling.  He has been struggling with pain management though he is on a standing dose of Percocet  and takes trazodone at night.  03/02/24: The patient presents today for his second postop appointment.  He is concerned about his postoperative shoulder.  He states that he stumbled and jarred his shoulder.  Ever since then he has had significant pain.          Current Outpatient Medications on File Prior to Visit   Medication Sig Dispense Refill    traZODone (DESYREL) 50 MG tablet Take 2 tablets by mouth nightly      ondansetron  (ZOFRAN ) 4 MG tablet Take 1 tablet by mouth 3 times daily as needed for Nausea or Vomiting 10 tablet 0    albuterol  sulfate HFA (VENTOLIN  HFA) 108 (90 Base) MCG/ACT inhaler Inhale 2 puffs into the lungs 4 times daily as needed for Wheezing 18 g 0    metoprolol succinate (TOPROL XL) 25 MG extended release tablet Take 1 tablet by mouth daily      chlorthalidone (HYGROTON) 25 MG tablet Take 1 tablet by mouth daily      tadalafil (CIALIS) 5 MG tablet Take 1 tablet by mouth as needed for Erectile  Dysfunction      sildenafil (VIAGRA) 100 MG tablet Take 1 tablet by mouth as needed for Erectile Dysfunction      finasteride (PROSCAR) 5 MG tablet Take 1 tablet by mouth daily      cetirizine (ZYRTEC) 10 MG tablet Take 1 tablet by mouth daily      docusate (COLACE, DULCOLAX) 100 MG CAPS Take 300 mg by mouth in the morning and at bedtime      senna (SENOKOT) 8.6 MG tablet Take 1 tablet by mouth 2 times daily      buPROPion (WELLBUTRIN XL) 300 MG extended release tablet Take 1 tablet by mouth every morning      amLODIPine (NORVASC) 10 MG tablet Take 0.5 tablets by mouth daily      esomeprazole Magnesium (NEXIUM) 20 MG PACK Take 1 packet by mouth nightly as needed (INDIGESTION)      Omega-3 Fatty Acids (FISH OIL) 1000 MG capsule Take 1 capsule by mouth 2 times daily      GNP PAIN RELIEF EX-STRENGTH 500 MG tablet Take 1 tablet by mouth every 8 hours as needed for Pain      triamcinolone (KENALOG) 0.1 % cream 0.1 g 3 times daily  oxyCODONE -acetaminophen  (PERCOCET ) 10-325 MG per tablet Take 1 tablet by mouth every 8 hours as needed for Pain.       No current facility-administered medications on file prior to visit.          Allergies   Allergen Reactions    Cefdinir Other (See Comments)     Pt does not recall his reaction.    Gabapentin Anxiety and Hallucinations    Prochlorperazine Anxiety and Other (See Comments)     Event:    Dizziness  REACTION: Flighty  compazine           Past Medical History:   Diagnosis Date    Acid reflux     Anxiety     Claustrophobia     Depression     HBP (high blood pressure)     High cholesterol     History of asthma     Rotator cuff tear, left     Seasonal allergies     Shoulder pain     Wears glasses          Past Surgical History:   Procedure Laterality Date    CERVICAL SPINE SURGERY      x2    COLONOSCOPY      KNEE ARTHROSCOPY Right     x2    KNEE ARTHROSCOPY Left 01/07/2022    LEFT KNEE ARTHROSCOPY WITH PARTIAL MEDIAL MENISECTOMY, CHONDROPLASTY, (PRP) PLATELET RICH PLASMA  INJECTION performed by Elsie JINNY Don, MD at [facility] MAIN OR    LUMBAR SPINE SURGERY      x3    SHOULDER ARTHROSCOPY Right 07/07/2019    SHOULDER ARTHROSCOPY Left 02/02/2024    LEFT SHOULDER ARTHROSCOPY-BICEPS TENODESIS-DCE-SAD performed by Carlus Lynwood SAUNDERS, MD at [facility]     No surgery found No surgery found     There were no vitals taken for this visit.       Exam:  Cervical Spine                          Right/Left  Extension [1]  Flexion [1]  Axial Rotation [1]/[1]  Lateral Bending [2]/[2]  Spurlings [0]/[0]    Shoulder                            Right/ Left  Forward Flexion [140]/[105] PTE:140 on left  Positive Dawburns  ER adducted [60] / [40]  IR adducted [T10]/ [L2 p]  ER abducted [90]/ [80 p]  IR abducted [60] / [10 p]         Rotaror Cuff  IS [5]/ [4 p]  Sub [5] / [5]  SS [5] / [4 p f -]    Positive empty can test    Left shoulder postop exam:  PTE: 90  ER: 20  IS: 4  Susbcap: 4    MRI left shoulder January 10, 2024 [facility] reviewed by me.  Coronal cuts show significant interstitial tear supraspinatus.  It appears to be both both articular and bursal fibers are intact but 50% interstitial has fluid in it.  There is some cartilage thinning inferiorly squaring off the inferior humeral head.  Minimal signal change in the superior labrum.  Moderate AC joint changes.  Joint effusion noted which is small but present.  Sagittal cuts show no atrophy or fatty infiltration.  Intra-articular biceps shows mild widening interstitial tear confirmed in the mid supraspinatus on sagittal cuts.  Axial cuts show intact subscapularis fluid around the rotator interval subcoracoid space, biceps within the groove.  Anterior and posterior labrum with some degenerative signal change throughout    XR CERVICAL SPINE (2-3 VIEWS)  C-spine 2 views maintenance of normal lordotic curve.  There is an ACDF   from C4-C6 been and hardware into T1.  All of the hardware is intact there   are no lucencies facets are well-maintained  uncovertebral joints show   degenerative changes with his fusions.  No acute fracture dislocation or   avulsion  XR SHOULDER LEFT (MIN 2 VIEWS)  Left shoulder 3 views shows mild narrowing of the glenohumeral joint   space.  There is a small inferior osteophyte off of the glenoid.  There is   sclerosis of the glenoid.  There is moderate sclerosis of the greater   tuberosity.  There is moderate degenerative changes acromioclavicular   joint.  No acute fracture dislocation or avulsion       Assessment:    1. Orthopedic aftercare  Overview:  Left shoulder arthroscopy with biceps tenodesis, with debridement, SAD, DCE. DOS:02/02/24    History of right shoulder rotator cuff repair 2020 by me  Assessment & Plan:  POV2: The patient presents today for follow-up on his left shoulder biceps tenodesis.  He was worried that he had compromised his repair.  Dr. DeMarco also examined the patient and feels that he is fine that he is just flared himself up.  We discussed resting, ice, maximizing his over-the-counter modalities and utilizing gabapentin at night.  He is aware that he needs to continue to stay in the sling.  He still has a 1 pound weight limit.  We are to start him in supervised physical therapy.  A note has been sent to the location of his choice.  He can follow-up with us  in 4 weeks for recheck.  Orders:  -     External Referral To Physical Therapy  2. Dysfunction of left rotator cuff  3. Cervicalgia  4. Essential hypertension      Return in about 4 weeks (around 03/30/2024).

## 2024-03-07 NOTE — Assessment & Plan Note (Signed)
 POV2: The patient presents today for follow-up on his left shoulder biceps tenodesis.  He was worried that he had compromised his repair.  Dr. DeMarco also examined the patient and feels that he is fine that he is just flared himself up.  We discussed resting, ice, maximizing his over-the-counter modalities and utilizing gabapentin at night.  He is aware that he needs to continue to stay in the sling.  He still has a 1 pound weight limit.  We are to start him in supervised physical therapy.  A note has been sent to the location of his choice.  He can follow-up with us  in 4 weeks for recheck.

## 2024-04-06 ENCOUNTER — Ambulatory Visit: Admit: 2024-04-06 | Discharge: 2024-04-06 | Payer: Medicare (Managed Care) | Attending: Surgical

## 2024-04-06 NOTE — Assessment & Plan Note (Signed)
 POV3: The patient presents today for follow-up on his left biceps tenodesis.  Unfortunately he was a little unclear on his postop restrictions.  We reiterated today that he should not be doing any lifting.  He should only lift up to 1 pound in his left hand.  He should be in the sling if he is traveling or in a crowd.  He does not have to be in his sling around his house or while he is sleeping as long as he follows his weight restriction.  It seems that he would likely be fine with his physical therapy protocol if he were not doing too much outside of physical therapy with his arm constantly keeping inflammed.  We discussed this today.  He was encouraged to rest ice and utilize NSAIDs for the next few days to help get him through this current inflammatory cycle.  Moving forward he is well aware of his restrictions.  He can continue with physical therapy 2-3 times a week as well as his home exercise program.  And I can see him back in 4 weeks for recheck.

## 2024-04-06 NOTE — Progress Notes (Signed)
 CC:   Chief Complaint   Patient presents with    Follow-up     Left shoulder POV:3       HPI: 12/25/2023: The patient is a retired [employer] farm manager.  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  He states that he is noticed left shoulder pain since February of this year.  He is unable to identify an injury that may have contributed to his pai, but does recall a moment whenever he internally rotated and felt a sharp searing pain.  Since then he has rated his pain as high as a 10 out of 10 with internal rotation and overhead activity he is not receiving injections or physical therapy.  He is active with housework.  01/13/24: Patient is here today for a left shoulder MRI review to determine the next course of treatment.  He received his images at [facility].  No change in pain since last visit.  02/10/24: The patient presents today for his first postop appointment.  He has been compliant with the sling.  He has been struggling with pain management though he is on a standing dose of Percocet  and takes trazodone at night.  03/02/24: The patient presents today for his second postop appointment.  He is concerned about his postoperative shoulder.  He states that he stumbled and jarred his shoulder.  Ever since then he has had significant pain.  04/06/24: The patient presents today for his third postop appointment.  He states that he has been coming in and out of the sling he has been lifting more than he probably should.  He has been going to physical therapy and states that it makes his shoulder more painful          Current Outpatient Medications on File Prior to Visit   Medication Sig Dispense Refill    traZODone (DESYREL) 50 MG tablet Take 2 tablets by mouth nightly      ondansetron  (ZOFRAN ) 4 MG tablet Take 1 tablet by mouth 3 times daily as needed for Nausea or Vomiting 10 tablet 0    albuterol  sulfate HFA (VENTOLIN  HFA) 108 (90 Base) MCG/ACT inhaler Inhale 2 puffs into the lungs 4 times daily as needed for Wheezing 18 g 0     metoprolol succinate (TOPROL XL) 25 MG extended release tablet Take 1 tablet by mouth daily      chlorthalidone (HYGROTON) 25 MG tablet Take 1 tablet by mouth daily      tadalafil (CIALIS) 5 MG tablet Take 1 tablet by mouth as needed for Erectile Dysfunction      sildenafil (VIAGRA) 100 MG tablet Take 1 tablet by mouth as needed for Erectile Dysfunction      finasteride (PROSCAR) 5 MG tablet Take 1 tablet by mouth daily      cetirizine (ZYRTEC) 10 MG tablet Take 1 tablet by mouth daily      docusate (COLACE, DULCOLAX) 100 MG CAPS Take 300 mg by mouth in the morning and at bedtime      senna (SENOKOT) 8.6 MG tablet Take 1 tablet by mouth 2 times daily      buPROPion (WELLBUTRIN XL) 300 MG extended release tablet Take 1 tablet by mouth every morning      amLODIPine (NORVASC) 10 MG tablet Take 0.5 tablets by mouth daily      esomeprazole Magnesium (NEXIUM) 20 MG PACK Take 1 packet by mouth nightly as needed (INDIGESTION)      Omega-3 Fatty Acids (FISH OIL) 1000 MG capsule Take 1  capsule by mouth 2 times daily      GNP PAIN RELIEF EX-STRENGTH 500 MG tablet Take 1 tablet by mouth every 8 hours as needed for Pain      triamcinolone (KENALOG) 0.1 % cream 0.1 g 3 times daily      oxyCODONE -acetaminophen  (PERCOCET ) 10-325 MG per tablet Take 1 tablet by mouth every 8 hours as needed for Pain.       No current facility-administered medications on file prior to visit.          Allergies   Allergen Reactions    Cefdinir Other (See Comments)     Pt does not recall his reaction.    Gabapentin Anxiety and Hallucinations    Prochlorperazine Anxiety and Other (See Comments)     Event:    Dizziness  REACTION: Flighty  compazine           Past Medical History:   Diagnosis Date    Acid reflux     Anxiety     Claustrophobia     Depression     HBP (high blood pressure)     High cholesterol     History of asthma     Rotator cuff tear, left     Seasonal allergies     Shoulder pain     Wears glasses          Past Surgical History:    Procedure Laterality Date    CERVICAL SPINE SURGERY      x2    COLONOSCOPY      KNEE ARTHROSCOPY Right     x2    KNEE ARTHROSCOPY Left 01/07/2022    LEFT KNEE ARTHROSCOPY WITH PARTIAL MEDIAL MENISECTOMY, CHONDROPLASTY, (PRP) PLATELET RICH PLASMA INJECTION performed by Elsie JINNY Don, MD at [facility] MAIN OR    LUMBAR SPINE SURGERY      x3    SHOULDER ARTHROSCOPY Right 07/07/2019    SHOULDER ARTHROSCOPY Left 02/02/2024    LEFT SHOULDER ARTHROSCOPY-BICEPS TENODESIS-DCE-SAD performed by Carlus Lynwood SAUNDERS, MD at [facility]     No surgery found No surgery found     There were no vitals taken for this visit.       Exam:  Cervical Spine                          Right/Left  Extension [1]  Flexion [1]  Axial Rotation [1]/[1]  Lateral Bending [2]/[2]  Spurlings [0]/[0]    Shoulder                            Right/ Left  Forward Flexion [140]/[105] PTE:140 on left  Positive Dawburns  ER adducted [60] / [40]  IR adducted [T10]/ [L2 p]  ER abducted [90]/ [80 p]  IR abducted [60] / [10 p]         Rotaror Cuff  IS [5]/ [4 p]  Sub [5] / [5]  SS [5] / [4 p f -]    Positive empty can test    Left shoulder postop exam:  PTE: 100  ER: 40  PM:EDPD  ER/Ir at 90; 45P/10  IS: 4  Susbcap: 4    MRI left shoulder January 10, 2024 [facility] reviewed by me.  Coronal cuts show significant interstitial tear supraspinatus.  It appears to be both both articular and bursal fibers are intact but 50% interstitial has fluid in it.  There is  some cartilage thinning inferiorly squaring off the inferior humeral head.  Minimal signal change in the superior labrum.  Moderate AC joint changes.  Joint effusion noted which is small but present.  Sagittal cuts show no atrophy or fatty infiltration.  Intra-articular biceps shows mild widening interstitial tear confirmed in the mid supraspinatus on sagittal cuts.  Axial cuts show intact subscapularis fluid around the rotator interval subcoracoid space, biceps within the groove.  Anterior and posterior labrum  with some degenerative signal change throughout    XR CERVICAL SPINE (2-3 VIEWS)  C-spine 2 views maintenance of normal lordotic curve.  There is an ACDF   from C4-C6 been and hardware into T1.  All of the hardware is intact there   are no lucencies facets are well-maintained uncovertebral joints show   degenerative changes with his fusions.  No acute fracture dislocation or   avulsion  XR SHOULDER LEFT (MIN 2 VIEWS)  Left shoulder 3 views shows mild narrowing of the glenohumeral joint   space.  There is a small inferior osteophyte off of the glenoid.  There is   sclerosis of the glenoid.  There is moderate sclerosis of the greater   tuberosity.  There is moderate degenerative changes acromioclavicular   joint.  No acute fracture dislocation or avulsion       Assessment:    1. Orthopedic aftercare  Overview:  Left shoulder arthroscopy with biceps tenodesis, with debridement, SAD, DCE. DOS:02/02/24    History of right shoulder rotator cuff repair 2020 by me  Assessment & Plan:   POV3: The patient presents today for follow-up on his left biceps tenodesis.  Unfortunately he was a little unclear on his postop restrictions.  We reiterated today that he should not be doing any lifting.  He should only lift up to 1 pound in his left hand.  He should be in the sling if he is traveling or in a crowd.  He does not have to be in his sling around his house or while he is sleeping as long as he follows his weight restriction.  It seems that he would likely be fine with his physical therapy protocol if he were not doing too much outside of physical therapy with his arm constantly keeping inflammed.  We discussed this today.  He was encouraged to rest ice and utilize NSAIDs for the next few days to help get him through this current inflammatory cycle.  Moving forward he is well aware of his restrictions.  He can continue with physical therapy 2-3 times a week as well as his home exercise program.  And I can see him back in 4 weeks  for recheck.  2. Dysfunction of left rotator cuff  3. Cervicalgia  4. Essential hypertension      Return in about 4 weeks (around 05/04/2024).

## 2024-05-06 ENCOUNTER — Ambulatory Visit: Admit: 2024-05-06 | Discharge: 2024-05-06 | Payer: Medicare (Managed Care) | Attending: Sports Medicine

## 2024-05-06 DIAGNOSIS — Z4789 Encounter for other orthopedic aftercare: Principal | ICD-10-CM

## 2024-05-06 NOTE — Progress Notes (Signed)
 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  He states that he is noticed left shoulder pain since February of this year.  He is unable to identify an injury that may have contributed to his pai, but does recall a moment whenever he internally rotated and felt a sharp searing pain.  Since then he has rated his pain as high as a 10 out of 10 with internal rotation and overhead activity he is not receiving injections or physical therapy.  He is active with housework.  01/13/24: Patient is here today for a left shoulder MRI review to determine the next course of treatment.  He received his images at [facility].  No change in pain since last visit.  02/10/24: The patient presents today for his first postop appointment.  He has been compliant with the sling.  He has been struggling with pain management though he is on a standing dose of Percocet  and takes trazodone at night.  03/02/24: The patient presents today for his second postop appointment.  He is concerned about his postoperative shoulder.  He states that he stumbled and jarred his shoulder.  Ever since then he has had significant pain.  04/06/24: The patient presents today for his third postop appointment.  He states that he has been coming in and out of the sling he has been lifting more than he probably should.  He has been going to physical therapy and states that it makes his shoulder more painful.  05/06/24:            Current Outpatient Medications on File Prior to Visit   Medication Sig Dispense Refill    traZODone (DESYREL) 50 MG tablet Take 2 tablets by mouth nightly      ondansetron  (ZOFRAN ) 4 MG tablet Take 1 tablet by mouth 3 times daily as needed for Nausea or Vomiting 10 tablet 0    albuterol  sulfate HFA (VENTOLIN  HFA) 108 (90 Base) MCG/ACT inhaler Inhale 2 puffs into the lungs 4 times daily as needed for Wheezing 18 g  0    metoprolol succinate (TOPROL XL) 25 MG extended release tablet Take 1 tablet by mouth daily      chlorthalidone (HYGROTON) 25 MG tablet Take 1 tablet by mouth daily      tadalafil (CIALIS) 5 MG tablet Take 1 tablet by mouth as needed for Erectile Dysfunction      sildenafil (VIAGRA) 100 MG tablet Take 1 tablet by mouth as needed for Erectile Dysfunction      finasteride (PROSCAR) 5 MG tablet Take 1 tablet by mouth daily      cetirizine (ZYRTEC) 10 MG tablet Take 1 tablet by mouth daily      docusate (COLACE, DULCOLAX) 100 MG CAPS Take 300 mg by mouth in the morning and at bedtime      senna (SENOKOT) 8.6 MG tablet Take 1 tablet by mouth 2 times daily      buPROPion (WELLBUTRIN XL) 300 MG extended release tablet Take 1 tablet by mouth every morning      amLODIPine (NORVASC) 10 MG tablet Take 0.5 tablets by mouth daily      esomeprazole Magnesium (NEXIUM) 20 MG PACK Take 1 packet by mouth nightly as needed (INDIGESTION)      Omega-3 Fatty Acids (FISH OIL) 1000 MG  capsule Take 1 capsule by mouth 2 times daily      GNP PAIN RELIEF EX-STRENGTH 500 MG tablet Take 1 tablet by mouth every 8 hours as needed for Pain      triamcinolone (KENALOG) 0.1 % cream 0.1 g 3 times daily      oxyCODONE -acetaminophen  (PERCOCET ) 10-325 MG per tablet Take 1 tablet by mouth every 8 hours as needed for Pain.       No current facility-administered medications on file prior to visit.          Allergies   Allergen Reactions    Cefdinir Other (See Comments)     Pt does not recall his reaction.    Gabapentin Anxiety and Hallucinations    Prochlorperazine Anxiety and Other (See Comments)     Event:    Dizziness  REACTION: Flighty  compazine           Past Medical History:   Diagnosis Date    Acid reflux     Anxiety     Claustrophobia     Depression     HBP (high blood pressure)     High cholesterol     History of asthma     Rotator cuff tear, left     Seasonal allergies     Shoulder pain     Wears glasses          Past Surgical History:    Procedure Laterality Date    CERVICAL SPINE SURGERY      x2    COLONOSCOPY      KNEE ARTHROSCOPY Right     x2    KNEE ARTHROSCOPY Left 01/07/2022    LEFT KNEE ARTHROSCOPY WITH PARTIAL MEDIAL MENISECTOMY, CHONDROPLASTY, (PRP) PLATELET RICH PLASMA INJECTION performed by Elsie JINNY Don, MD at [facility] MAIN OR    LUMBAR SPINE SURGERY      x3    SHOULDER ARTHROSCOPY Right 07/07/2019    SHOULDER ARTHROSCOPY Left 02/02/2024    LEFT SHOULDER ARTHROSCOPY-BICEPS TENODESIS-DCE-SAD performed by Carlus Lynwood SAUNDERS, MD at [facility]     Left Shoulder Arthroscopy-biceps Tenodesis-dce-sad - Left 02/02/2024     There were no vitals taken for this visit.       Exam:  Cervical Spine                          Right/Left  Extension [1]  Flexion [1]  Axial Rotation [1]/[1]  Lateral Bending [2]/[2]  Spurlings [0]/[0]    Shoulder                            Right/ Left  Forward Flexion [140]/[105] PTE:140 on left  Positive Dawburns  ER adducted [60] / [40]  IR adducted [T10]/ [L2 p]  ER abducted [90]/ [80 p]  IR abducted [60] / [10 p]         Rotaror Cuff  IS [5]/ [4 p]  Sub [5] / [5]  SS [5] / [4 p f -]    Positive empty can test    Left shoulder postop exam:  PTE: 120  ER: 40  IR: L5  ER/Ir at 90; 65/30  IS: 4  Susbcap: 4  Supraspinatus: 4    MRI left shoulder January 10, 2024 [facility] reviewed by me.  Coronal cuts show significant interstitial tear supraspinatus.  It appears to be both both articular and bursal fibers are intact but 50%  interstitial has fluid in it.  There is some cartilage thinning inferiorly squaring off the inferior humeral head.  Minimal signal change in the superior labrum.  Moderate AC joint changes.  Joint effusion noted which is small but present.  Sagittal cuts show no atrophy or fatty infiltration.  Intra-articular biceps shows mild widening interstitial tear confirmed in the mid supraspinatus on sagittal cuts.  Axial cuts show intact subscapularis fluid around the rotator interval subcoracoid space, biceps  within the groove.  Anterior and posterior labrum with some degenerative signal change throughout    No image results found.       Assessment:    1. Orthopedic aftercare  Overview:  Left shoulder arthroscopy with biceps tenodesis, with debridement, SAD, DCE. DOS:02/02/24    History of right shoulder rotator cuff repair 2020 by me  Assessment & Plan:  He looks good today.  Has some tightness with external and internal rotation.  I want him to continue physical therapy a couple times a week for least the next 4 to 6 weeks.  I want him stretching and at home 3 times a day.  There is some mild flattening of the outer edge of the biceps may be due to the muscle shutdown from the tenodesis or he has been doing a lot, could be from injury to the tendon.  Either way he does not need any further treatment.  It will heal through the bone even if he tore it away from the tenodesis.  He understands this and I spoke with Faith his wife about this   2. Dysfunction of left rotator cuff  3. Cervicalgia  Assessment & Plan:  The patient was previously given the pain handout detailing modalities, including ice and heat, anti-inflammatories, topical rubs, and recommendations on nutition and exercise.  They were encouraged to utilize these recommendations for exacerbations and maintenance.    4. Essential hypertension  Assessment & Plan:  Hypertension complicates the course of treatment by adding additional risks with incorporating anti-inflammatories, steroid injections or oral corticosteroids.  It also increases risk assessment perioperatively.        Return in about 4 weeks (around 06/03/2024).    "

## 2024-05-06 NOTE — Assessment & Plan Note (Signed)
The patient was previously given the pain handout detailing modalities, including ice and heat, anti-inflammatories, topical rubs, and recommendations on nutition and exercise.  They were encouraged to utilize these recommendations for exacerbations and maintenance.

## 2024-05-06 NOTE — Assessment & Plan Note (Signed)
"  Hypertension complicates the course of treatment by adding additional risks with incorporating anti-inflammatories, steroid injections or oral corticosteroids.  It also increases risk assessment perioperatively.    "

## 2024-05-06 NOTE — Assessment & Plan Note (Signed)
"  He looks good today.  Has some tightness with external and internal rotation.  I want him to continue physical therapy a couple times a week for least the next 4 to 6 weeks.  I want him stretching and at home 3 times a day.  There is some mild flattening of the outer edge of the biceps may be due to the muscle shutdown from the tenodesis or he has been doing a lot, could be from injury to the tendon.  Either way he does not need any further treatment.  It will heal through the bone even if he tore it away from the tenodesis.  He understands this and I spoke with Faith his wife about this   "

## 2024-06-08 ENCOUNTER — Ambulatory Visit: Admit: 2024-06-08 | Discharge: 2024-06-08 | Payer: Medicare (Managed Care) | Attending: Sports Medicine

## 2024-06-08 DIAGNOSIS — M542 Cervicalgia: Principal | ICD-10-CM

## 2024-06-08 NOTE — Assessment & Plan Note (Signed)
"  He looks good today.  He is getting some strength back some motion and want to progress him as he can tolerate with therapy and note was written to therapy to push him a little harder.  I encouraged him to to get more resilient with his home exercise program and really do that.    "

## 2024-06-08 NOTE — Progress Notes (Signed)
 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  He states that he is noticed left shoulder pain since February of this year.  He is unable to identify an injury that may have contributed to his pai, but does recall a moment whenever he internally rotated and felt a sharp searing pain.  Since then he has rated his pain as high as a 10 out of 10 with internal rotation and overhead activity he is not receiving injections or physical therapy.  He is active with housework.  01/13/24: Patient is here today for a left shoulder MRI review to determine the next course of treatment.  He received his images at [place].  No change in pain since last visit.  02/10/24: The patient presents today for his first postop appointment.  He has been compliant with the sling.  He has been struggling with pain management though he is on a standing dose of Percocet  and takes trazodone at night.  03/02/24: The patient presents today for his second postop appointment.  He is concerned about his postoperative shoulder.  He states that he stumbled and jarred his shoulder.  Ever since then he has had significant pain.  04/06/24: The patient presents today for his third postop appointment.  He states that he has been coming in and out of the sling he has been lifting more than he probably should.  He has been going to physical therapy and states that it makes his shoulder more painful.  05/06/24:  06/08/24: The patient presents today for fifth postop appointment.            Current Outpatient Medications on File Prior to Visit   Medication Sig Dispense Refill    traZODone (DESYREL) 50 MG tablet Take 2 tablets by mouth nightly      ondansetron  (ZOFRAN ) 4 MG tablet Take 1 tablet by mouth 3 times daily as needed for Nausea or Vomiting 10 tablet 0    albuterol  sulfate HFA (VENTOLIN  HFA) 108 (90 Base) MCG/ACT inhaler  Inhale 2 puffs into the lungs 4 times daily as needed for Wheezing 18 g 0    metoprolol succinate (TOPROL XL) 25 MG extended release tablet Take 1 tablet by mouth daily      chlorthalidone (HYGROTON) 25 MG tablet Take 1 tablet by mouth daily      tadalafil (CIALIS) 5 MG tablet Take 1 tablet by mouth as needed for Erectile Dysfunction      sildenafil (VIAGRA) 100 MG tablet Take 1 tablet by mouth as needed for Erectile Dysfunction      finasteride (PROSCAR) 5 MG tablet Take 1 tablet by mouth daily      cetirizine (ZYRTEC) 10 MG tablet Take 1 tablet by mouth daily      docusate (COLACE, DULCOLAX) 100 MG CAPS Take 300 mg by mouth in the morning and at bedtime      senna (SENOKOT) 8.6 MG tablet Take 1 tablet by mouth 2 times daily      buPROPion (WELLBUTRIN XL) 300 MG extended release tablet Take 1 tablet by mouth every morning      amLODIPine (NORVASC) 10 MG tablet Take 0.5 tablets by mouth daily      esomeprazole Magnesium (NEXIUM) 20 MG PACK Take 1 packet by mouth nightly as needed (INDIGESTION)  Omega-3 Fatty Acids (FISH OIL) 1000 MG capsule Take 1 capsule by mouth 2 times daily      GNP PAIN RELIEF EX-STRENGTH 500 MG tablet Take 1 tablet by mouth every 8 hours as needed for Pain      triamcinolone (KENALOG) 0.1 % cream 0.1 g 3 times daily      oxyCODONE -acetaminophen  (PERCOCET ) 10-325 MG per tablet Take 1 tablet by mouth every 8 hours as needed for Pain.       No current facility-administered medications on file prior to visit.          Allergies   Allergen Reactions    Cefdinir Other (See Comments)     Pt does not recall his reaction.    Gabapentin Anxiety and Hallucinations    Prochlorperazine Anxiety and Other (See Comments)     Event:    Dizziness  REACTION: Flighty  compazine           Past Medical History:   Diagnosis Date    Acid reflux     Anxiety     Claustrophobia     Depression     HBP (high blood pressure)     High cholesterol     History of asthma     Rotator cuff tear, left     Seasonal allergies      Shoulder pain     Wears glasses          Past Surgical History:   Procedure Laterality Date    CERVICAL SPINE SURGERY      x2    COLONOSCOPY      KNEE ARTHROSCOPY Right     x2    KNEE ARTHROSCOPY Left 01/07/2022    LEFT KNEE ARTHROSCOPY WITH PARTIAL MEDIAL MENISECTOMY, CHONDROPLASTY, (PRP) PLATELET RICH PLASMA INJECTION performed by Elsie JINNY Don, MD at [facility] MAIN OR    LUMBAR SPINE SURGERY      x3    SHOULDER ARTHROSCOPY Right 07/07/2019    SHOULDER ARTHROSCOPY Left 02/02/2024    LEFT SHOULDER ARTHROSCOPY-BICEPS TENODESIS-DCE-SAD performed by Carlus Lynwood SAUNDERS, MD at [facility]     Left Shoulder Arthroscopy-biceps Tenodesis-dce-sad - Left 02/02/2024     There were no vitals taken for this visit.       Exam:  Cervical Spine                          Right/Left  Extension [1]  Flexion [1]  Axial Rotation [1]/[1]  Lateral Bending [2]/[2]  Spurlings [0]/[0]    Shoulder                            Right/ Left  Forward Flexion [140]/[105] PTE:140 on left  Positive Dawburns  ER adducted [60] / [40]  IR adducted [T10]/ [L2 p]  ER abducted [90]/ [80 p]  IR abducted [60] / [10 p]         Rotaror Cuff  IS [5]/ [4 p]  Sub [5] / [5]  SS [5] / [4 p f -]    Positive empty can test    Left shoulder postop exam:  PTE: 130  ER: 50  IR: T12  ER/Ir at 90; 85/40  IS: 4+  Susbcap: 4+  Supraspinatus: 4+    MRI left shoulder January 10, 2024 [facility] reviewed by me.  Coronal cuts show significant interstitial tear supraspinatus.  It appears to be both both articular  and bursal fibers are intact but 50% interstitial has fluid in it.  There is some cartilage thinning inferiorly squaring off the inferior humeral head.  Minimal signal change in the superior labrum.  Moderate AC joint changes.  Joint effusion noted which is small but present.  Sagittal cuts show no atrophy or fatty infiltration.  Intra-articular biceps shows mild widening interstitial tear confirmed in the mid supraspinatus on sagittal cuts.  Axial cuts show intact  subscapularis fluid around the rotator interval subcoracoid space, biceps within the groove.  Anterior and posterior labrum with some degenerative signal change throughout    No image results found.       Assessment:    1. Cervicalgia  2. Orthopedic aftercare  Overview:  Left shoulder arthroscopy with biceps tenodesis, with debridement, SAD, DCE. DOS:02/02/24    History of right shoulder rotator cuff repair 2020 by me  Assessment & Plan:  He looks good today.  He is getting some strength back some motion and want to progress him as he can tolerate with therapy and note was written to therapy to push him a little harder.  I encouraged him to to get more resilient with his home exercise program and really do that.    3. Dysfunction of left rotator cuff        Return in about 6 weeks (around 07/20/2024).    "

## 2024-08-03 ENCOUNTER — Ambulatory Visit: Admit: 2024-08-03 | Discharge: 2024-08-03 | Payer: Medicare (Managed Care) | Attending: Sports Medicine

## 2024-08-03 DIAGNOSIS — Z4789 Encounter for other orthopedic aftercare: Principal | ICD-10-CM

## 2024-08-03 NOTE — Assessment & Plan Note (Signed)
 He looks very good today.  We counseled him on how much weight he can left through the exercise program he can do weight that he can lift 15 times for 3 sets.  Can do over-the-counter modalities can be done with physical therapy and just do his home exercise program at this point.  I will see him back on an as-needed basis

## 2024-08-03 NOTE — Progress Notes (Signed)
 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  He states that he is noticed left shoulder pain since February of this year.  He is unable to identify an injury that may have contributed to his pai, but does recall a moment whenever he internally rotated and felt a sharp searing pain.  Since then he has rated his pain as high as a 10 out of 10 with internal rotation and overhead activity he is not receiving injections or physical therapy.  He is active with housework.  01/13/24: Patient is here today for a left shoulder MRI review to determine the next course of treatment.  He received his images at [place].  No change in pain since last visit.  02/10/24: The patient presents today for his first postop appointment.  He has been compliant with the sling.  He has been struggling with pain management though he is on a standing dose of Percocet  and takes trazodone at night.  03/02/24: The patient presents today for his second postop appointment.  He is concerned about his postoperative shoulder.  He states that he stumbled and jarred his shoulder.  Ever since then he has had significant pain.  04/06/24: The patient presents today for his third postop appointment.  He states that he has been coming in and out of the sling he has been lifting more than he probably should.  He has been going to physical therapy and states that it makes his shoulder more painful.  05/06/24:  06/08/24: The patient presents today for fifth postop appointment.  08/03/24: At last appointment, we discussed progressing in physical therapy and encouraged him to get more resilient with his home exercise program. Patient states he is doing good today. He is no longer going to PT but doing at home exercises.           Current Outpatient Medications on File Prior to Visit   Medication Sig Dispense Refill    traZODone  (DESYREL) 50 MG tablet Take 2 tablets by mouth nightly      ondansetron  (ZOFRAN ) 4 MG tablet Take 1 tablet by mouth 3 times daily as needed for Nausea or Vomiting 10 tablet 0    albuterol  sulfate HFA (VENTOLIN  HFA) 108 (90 Base) MCG/ACT inhaler Inhale 2 puffs into the lungs 4 times daily as needed for Wheezing 18 g 0    metoprolol succinate (TOPROL XL) 25 MG extended release tablet Take 1 tablet by mouth daily      chlorthalidone (HYGROTON) 25 MG tablet Take 1 tablet by mouth daily      tadalafil (CIALIS) 5 MG tablet Take 1 tablet by mouth as needed for Erectile Dysfunction      sildenafil (VIAGRA) 100 MG tablet Take 1 tablet by mouth as needed for Erectile Dysfunction      finasteride (PROSCAR) 5 MG tablet Take 1 tablet by mouth daily      cetirizine (ZYRTEC) 10 MG tablet Take 1 tablet by mouth daily      docusate (COLACE, DULCOLAX) 100 MG CAPS Take 300 mg by mouth in the morning and at bedtime      senna (SENOKOT) 8.6 MG tablet Take 1 tablet by mouth 2 times daily      buPROPion (WELLBUTRIN XL) 300 MG extended release tablet Take 1 tablet  by mouth every morning      amLODIPine (NORVASC) 10 MG tablet Take 0.5 tablets by mouth daily      esomeprazole Magnesium (NEXIUM) 20 MG PACK Take 1 packet by mouth nightly as needed (INDIGESTION)      Omega-3 Fatty Acids (FISH OIL) 1000 MG capsule Take 1 capsule by mouth 2 times daily      GNP PAIN RELIEF EX-STRENGTH 500 MG tablet Take 1 tablet by mouth every 8 hours as needed for Pain      triamcinolone (KENALOG) 0.1 % cream 0.1 g 3 times daily      oxyCODONE -acetaminophen  (PERCOCET ) 10-325 MG per tablet Take 1 tablet by mouth every 8 hours as needed for Pain.       No current facility-administered medications on file prior to visit.          Allergies   Allergen Reactions    Cefdinir Other (See Comments)     Pt does not recall his reaction.    Gabapentin Anxiety and Hallucinations    Prochlorperazine Anxiety and Other (See Comments)     Event:    Dizziness  REACTION:  Flighty  compazine           Past Medical History:   Diagnosis Date    Acid reflux     Anxiety     Claustrophobia     Depression     HBP (high blood pressure)     High cholesterol     History of asthma     Rotator cuff tear, left     Seasonal allergies     Shoulder pain     Wears glasses          Past Surgical History:   Procedure Laterality Date    CERVICAL SPINE SURGERY      x2    COLONOSCOPY      KNEE ARTHROSCOPY Right     x2    KNEE ARTHROSCOPY Left 01/07/2022    LEFT KNEE ARTHROSCOPY WITH PARTIAL MEDIAL MENISECTOMY, CHONDROPLASTY, (PRP) PLATELET RICH PLASMA INJECTION performed by Elsie JINNY Don, MD at [facility] MAIN OR    LUMBAR SPINE SURGERY      x3    SHOULDER ARTHROSCOPY Right 07/07/2019    SHOULDER ARTHROSCOPY Left 02/02/2024    LEFT SHOULDER ARTHROSCOPY-BICEPS TENODESIS-DCE-SAD performed by Carlus Lynwood SAUNDERS, MD at [facility]     Left Shoulder Arthroscopy-biceps Tenodesis-dce-sad - Left 02/02/2024     There were no vitals taken for this visit.       Exam:  Cervical Spine                          Right/Left  Extension [1]  Flexion [1]  Axial Rotation [1]/[1]  Lateral Bending [2]/[2]  Spurlings [0]/[0]    Shoulder                            Right/ Left  Forward Flexion [140]/[105] PTE:140 on left  Positive Dawburns  ER adducted [60] / [40]  IR adducted [T10]/ [L2 p]  ER abducted [90]/ [80 p]  IR abducted [60] / [10 p]         Rotaror Cuff  IS [5]/ [4 p]  Sub [5] / [5]  SS [5] / [4 p f -]    Positive empty can test    Left shoulder postop exam:  PTE: 135  ER: 60  IR:  T11  ER/Ir at 90; 90/50  IS: 5  Susbcap: 5  Supraspinatus: 5    MRI left shoulder January 10, 2024 [facility] reviewed by me.  Coronal cuts show significant interstitial tear supraspinatus.  It appears to be both both articular and bursal fibers are intact but 50% interstitial has fluid in it.  There is some cartilage thinning inferiorly squaring off the inferior humeral head.  Minimal signal change in the superior labrum.  Moderate AC joint  changes.  Joint effusion noted which is small but present.  Sagittal cuts show no atrophy or fatty infiltration.  Intra-articular biceps shows mild widening interstitial tear confirmed in the mid supraspinatus on sagittal cuts.  Axial cuts show intact subscapularis fluid around the rotator interval subcoracoid space, biceps within the groove.  Anterior and posterior labrum with some degenerative signal change throughout    No image results found.       Assessment:    1. Orthopedic aftercare  Overview:  Left shoulder arthroscopy with biceps tenodesis, with debridement, SAD, DCE. DOS:02/02/24    History of right shoulder rotator cuff repair 2020 by me  Assessment & Plan:  He looks very good today.  We counseled him on how much weight he can left through the exercise program he can do weight that he can lift 15 times for 3 sets.  Can do over-the-counter modalities can be done with physical therapy and just do his home exercise program at this point.  I will see him back on an as-needed basis   2. Cervicalgia  3. Dysfunction of left rotator cuff          Return if symptoms worsen or fail to improve.
